# Patient Record
Sex: Female | Born: 1970 | Race: White | Hispanic: No | Marital: Married | Smoking: Never smoker
Health system: Southern US, Community
[De-identification: ages and names within clinical notes are randomized; demographics above are authoritative.]

## PROBLEM LIST (undated history)

## (undated) DIAGNOSIS — J209 Acute bronchitis, unspecified: Secondary | ICD-10-CM

## (undated) DIAGNOSIS — Z0000 Encounter for general adult medical examination without abnormal findings: Secondary | ICD-10-CM

## (undated) DIAGNOSIS — B269 Mumps without complication: Secondary | ICD-10-CM

## (undated) DIAGNOSIS — D696 Thrombocytopenia, unspecified: Secondary | ICD-10-CM

## (undated) DIAGNOSIS — D229 Melanocytic nevi, unspecified: Secondary | ICD-10-CM

## (undated) DIAGNOSIS — N390 Urinary tract infection, site not specified: Secondary | ICD-10-CM

## (undated) DIAGNOSIS — M542 Cervicalgia: Secondary | ICD-10-CM

## (undated) DIAGNOSIS — L989 Disorder of the skin and subcutaneous tissue, unspecified: Secondary | ICD-10-CM

## (undated) DIAGNOSIS — F411 Generalized anxiety disorder: Secondary | ICD-10-CM

## (undated) DIAGNOSIS — J45901 Unspecified asthma with (acute) exacerbation: Secondary | ICD-10-CM

## (undated) DIAGNOSIS — E785 Hyperlipidemia, unspecified: Secondary | ICD-10-CM

## (undated) DIAGNOSIS — N6311 Unspecified lump in the right breast, upper outer quadrant: Secondary | ICD-10-CM

## (undated) DIAGNOSIS — Z87440 Personal history of urinary (tract) infections: Secondary | ICD-10-CM

## (undated) DIAGNOSIS — T7840XA Allergy, unspecified, initial encounter: Secondary | ICD-10-CM

## (undated) DIAGNOSIS — J45909 Unspecified asthma, uncomplicated: Secondary | ICD-10-CM

## (undated) DIAGNOSIS — R739 Hyperglycemia, unspecified: Secondary | ICD-10-CM

## (undated) DIAGNOSIS — B019 Varicella without complication: Secondary | ICD-10-CM

## (undated) DIAGNOSIS — G43909 Migraine, unspecified, not intractable, without status migrainosus: Secondary | ICD-10-CM

## (undated) DIAGNOSIS — O99119 Other diseases of the blood and blood-forming organs and certain disorders involving the immune mechanism complicating pregnancy, unspecified trimester: Secondary | ICD-10-CM

## (undated) DIAGNOSIS — Z86718 Personal history of other venous thrombosis and embolism: Secondary | ICD-10-CM

## (undated) DIAGNOSIS — M79674 Pain in right toe(s): Secondary | ICD-10-CM

## (undated) HISTORY — DX: Unspecified asthma with (acute) exacerbation: J45.901

## (undated) HISTORY — DX: Melanocytic nevi, unspecified: D22.9

## (undated) HISTORY — DX: Encounter for general adult medical examination without abnormal findings: Z00.00

## (undated) HISTORY — DX: Hyperglycemia, unspecified: R73.9

## (undated) HISTORY — DX: Acute bronchitis, unspecified: J20.9

## (undated) HISTORY — DX: Unspecified lump in the right breast, upper outer quadrant: N63.11

## (undated) HISTORY — DX: Mumps without complication: B26.9

## (undated) HISTORY — DX: Unspecified asthma, uncomplicated: J45.909

## (undated) HISTORY — DX: Thrombocytopenia, unspecified: D69.6

## (undated) HISTORY — DX: Personal history of urinary (tract) infections: Z87.440

## (undated) HISTORY — DX: Allergy, unspecified, initial encounter: T78.40XA

## (undated) HISTORY — DX: Pain in right toe(s): M79.674

## (undated) HISTORY — DX: Varicella without complication: B01.9

## (undated) HISTORY — DX: Generalized anxiety disorder: F41.1

## (undated) HISTORY — DX: Hyperlipidemia, unspecified: E78.5

## (undated) HISTORY — DX: Disorder of the skin and subcutaneous tissue, unspecified: L98.9

## (undated) HISTORY — DX: Other diseases of the blood and blood-forming organs and certain disorders involving the immune mechanism complicating pregnancy, unspecified trimester: O99.119

## (undated) HISTORY — DX: Personal history of other venous thrombosis and embolism: Z86.718

## (undated) HISTORY — DX: Urinary tract infection, site not specified: N39.0

## (undated) HISTORY — DX: Cervicalgia: M54.2

## (undated) HISTORY — DX: Migraine, unspecified, not intractable, without status migrainosus: G43.909

## (undated) HISTORY — PX: TUBAL LIGATION: SHX77

---

## 1990-09-21 HISTORY — PX: HAMMER TOE SURGERY: SHX385

## 2003-09-22 LAB — HM PAP SMEAR: HM Pap smear: NORMAL

## 2012-02-29 ENCOUNTER — Encounter: Payer: Self-pay | Admitting: Family Medicine

## 2012-02-29 ENCOUNTER — Ambulatory Visit (INDEPENDENT_AMBULATORY_CARE_PROVIDER_SITE_OTHER): Payer: BC Managed Care – PPO | Admitting: Family Medicine

## 2012-02-29 VITALS — BP 102/66 | HR 54 | Temp 98.2°F | Ht 62.75 in | Wt 108.1 lb

## 2012-02-29 DIAGNOSIS — D229 Melanocytic nevi, unspecified: Secondary | ICD-10-CM

## 2012-02-29 DIAGNOSIS — Z0000 Encounter for general adult medical examination without abnormal findings: Secondary | ICD-10-CM

## 2012-02-29 DIAGNOSIS — D696 Thrombocytopenia, unspecified: Secondary | ICD-10-CM

## 2012-02-29 DIAGNOSIS — J45909 Unspecified asthma, uncomplicated: Secondary | ICD-10-CM | POA: Insufficient documentation

## 2012-02-29 DIAGNOSIS — R7309 Other abnormal glucose: Secondary | ICD-10-CM

## 2012-02-29 DIAGNOSIS — D239 Other benign neoplasm of skin, unspecified: Secondary | ICD-10-CM

## 2012-02-29 DIAGNOSIS — R739 Hyperglycemia, unspecified: Secondary | ICD-10-CM

## 2012-02-29 DIAGNOSIS — O99119 Other diseases of the blood and blood-forming organs and certain disorders involving the immune mechanism complicating pregnancy, unspecified trimester: Secondary | ICD-10-CM

## 2012-02-29 HISTORY — DX: Other diseases of the blood and blood-forming organs and certain disorders involving the immune mechanism complicating pregnancy, unspecified trimester: D69.6

## 2012-02-29 HISTORY — DX: Thrombocytopenia, unspecified: D69.6

## 2012-02-29 HISTORY — DX: Hyperglycemia, unspecified: R73.9

## 2012-02-29 LAB — LIPID PANEL
Cholesterol: 114 mg/dL (ref 0–200)
HDL: 44.5 mg/dL (ref >39.00)
LDL Cholesterol: 55 mg/dL (ref 0–99)
Triglycerides: 71 mg/dL (ref 0.0–149.0)
VLDL: 14.2 mg/dL (ref 0.0–40.0)

## 2012-02-29 LAB — HEPATIC FUNCTION PANEL
Albumin: 4.4 g/dL (ref 3.5–5.2)
Bilirubin, Direct: 0.1 mg/dL (ref 0.0–0.3)
Total Protein: 6.9 g/dL (ref 6.0–8.3)

## 2012-02-29 LAB — RENAL FUNCTION PANEL
CO2: 26 mEq/L (ref 19–32)
Chloride: 107 mEq/L (ref 96–112)
GFR: 108.6 mL/min (ref >60.00)
Phosphorus: 3 mg/dL (ref 2.3–4.6)
Potassium: 4.6 mEq/L (ref 3.5–5.1)

## 2012-02-29 LAB — CBC
Platelets: 115 10*3/uL — ABNORMAL LOW (ref 150.0–400.0)
RBC: 4.38 Mil/uL (ref 3.87–5.11)

## 2012-02-29 MED ORDER — ALBUTEROL SULFATE HFA 108 (90 BASE) MCG/ACT IN AERS: 2.0000 | INHALATION_SPRAY | Freq: Four times a day (QID) | RESPIRATORY_TRACT | Status: DC | PRN

## 2012-02-29 NOTE — Patient Instructions (Signed)
Preventive Care for Adults, Female A healthy lifestyle and preventive care can promote health and wellness. Preventive health guidelines for women include the following key practices.  A routine yearly physical is a good way to check with your caregiver about your health and preventive screening. It is a chance to share any concerns and updates on your health, and to receive a thorough exam.   Visit your dentist for a routine exam and preventive care every 6 months. Brush your teeth twice a day and floss once a day. Good oral hygiene prevents tooth decay and gum disease.   The frequency of eye exams is based on your age, health, family medical history, use of contact lenses, and other factors. Follow your caregiver's recommendations for frequency of eye exams.   Eat a healthy diet. Foods like vegetables, fruits, whole grains, low-fat dairy products, and lean protein foods contain the nutrients you need without too many calories. Decrease your intake of foods high in solid fats, added sugars, and salt. Eat the right amount of calories for you.Get information about a proper diet from your caregiver, if necessary.   Regular physical exercise is one of the most important things you can do for your health. Most adults should get at least 150 minutes of moderate-intensity exercise (any activity that increases your heart rate and causes you to sweat) each week. In addition, most adults need muscle-strengthening exercises on 2 or more days a week.   Maintain a healthy weight. The body mass index (BMI) is a screening tool to identify possible weight problems. It provides an estimate of body fat based on height and weight. Your caregiver can help determine your BMI, and can help you achieve or maintain a healthy weight.For adults 20 years and older:   A BMI below 18.5 is considered underweight.   A BMI of 18.5 to 24.9 is normal.   A BMI of 25 to 29.9 is considered overweight.   A BMI of 30 and above is  considered obese.   Maintain normal blood lipids and cholesterol levels by exercising and minimizing your intake of saturated fat. Eat a balanced diet with plenty of fruit and vegetables. Blood tests for lipids and cholesterol should begin at age 20 and be repeated every 5 years. If your lipid or cholesterol levels are high, you are over 50, or you are at high risk for heart disease, you may need your cholesterol levels checked more frequently.Ongoing high lipid and cholesterol levels should be treated with medicines if diet and exercise are not effective.   If you smoke, find out from your caregiver how to quit. If you do not use tobacco, do not start.   If you are pregnant, do not drink alcohol. If you are breastfeeding, be very cautious about drinking alcohol. If you are not pregnant and choose to drink alcohol, do not exceed 1 drink per day. One drink is considered to be 12 ounces (355 mL) of beer, 5 ounces (148 mL) of wine, or 1.5 ounces (44 mL) of liquor.   Avoid use of street drugs. Do not share needles with anyone. Ask for help if you need support or instructions about stopping the use of drugs.   High blood pressure causes heart disease and increases the risk of stroke. Your blood pressure should be checked at least every 1 to 2 years. Ongoing high blood pressure should be treated with medicines if weight loss and exercise are not effective.   If you are 55 to 41   years old, ask your caregiver if you should take aspirin to prevent strokes.   Diabetes screening involves taking a blood sample to check your fasting blood sugar level. This should be done once every 3 years, after age 45, if you are within normal weight and without risk factors for diabetes. Testing should be considered at a younger age or be carried out more frequently if you are overweight and have at least 1 risk factor for diabetes.   Breast cancer screening is essential preventive care for women. You should practice "breast  self-awareness." This means understanding the normal appearance and feel of your breasts and may include breast self-examination. Any changes detected, no matter how small, should be reported to a caregiver. Women in their 20s and 30s should have a clinical breast exam (CBE) by a caregiver as part of a regular health exam every 1 to 3 years. After age 40, women should have a CBE every year. Starting at age 40, women should consider having a mammography (breast X-ray test) every year. Women who have a family history of breast cancer should talk to their caregiver about genetic screening. Women at a high risk of breast cancer should talk to their caregivers about having magnetic resonance imaging (MRI) and a mammography every year.   The Pap test is a screening test for cervical cancer. A Pap test can show cell changes on the cervix that might become cervical cancer if left untreated. A Pap test is a procedure in which cells are obtained and examined from the lower end of the uterus (cervix).   Women should have a Pap test starting at age 21.   Between ages 21 and 29, Pap tests should be repeated every 2 years.   Beginning at age 30, you should have a Pap test every 3 years as long as the past 3 Pap tests have been normal.   Some women have medical problems that increase the chance of getting cervical cancer. Talk to your caregiver about these problems. It is especially important to talk to your caregiver if a new problem develops soon after your last Pap test. In these cases, your caregiver may recommend more frequent screening and Pap tests.   The above recommendations are the same for women who have or have not gotten the vaccine for human papillomavirus (HPV).   If you had a hysterectomy for a problem that was not cancer or a condition that could lead to cancer, then you no longer need Pap tests. Even if you no longer need a Pap test, a regular exam is a good idea to make sure no other problems are  starting.   If you are between ages 65 and 70, and you have had normal Pap tests going back 10 years, you no longer need Pap tests. Even if you no longer need a Pap test, a regular exam is a good idea to make sure no other problems are starting.   If you have had past treatment for cervical cancer or a condition that could lead to cancer, you need Pap tests and screening for cancer for at least 20 years after your treatment.   If Pap tests have been discontinued, risk factors (such as a new sexual partner) need to be reassessed to determine if screening should be resumed.   The HPV test is an additional test that may be used for cervical cancer screening. The HPV test looks for the virus that can cause the cell changes on the cervix.   The cells collected during the Pap test can be tested for HPV. The HPV test could be used to screen women aged 30 years and older, and should be used in women of any age who have unclear Pap test results. After the age of 30, women should have HPV testing at the same frequency as a Pap test.   Colorectal cancer can be detected and often prevented. Most routine colorectal cancer screening begins at the age of 50 and continues through age 75. However, your caregiver may recommend screening at an earlier age if you have risk factors for colon cancer. On a yearly basis, your caregiver may provide home test kits to check for hidden blood in the stool. Use of a small camera at the end of a tube, to directly examine the colon (sigmoidoscopy or colonoscopy), can detect the earliest forms of colorectal cancer. Talk to your caregiver about this at age 50, when routine screening begins. Direct examination of the colon should be repeated every 5 to 10 years through age 75, unless early forms of pre-cancerous polyps or small growths are found.   Hepatitis C blood testing is recommended for all people born from 1945 through 1965 and any individual with known risks for hepatitis C.    Practice safe sex. Use condoms and avoid high-risk sexual practices to reduce the spread of sexually transmitted infections (STIs). STIs include gonorrhea, chlamydia, syphilis, trichomonas, herpes, HPV, and human immunodeficiency virus (HIV). Herpes, HIV, and HPV are viral illnesses that have no cure. They can result in disability, cancer, and death. Sexually active women aged 25 and younger should be checked for chlamydia. Older women with new or multiple partners should also be tested for chlamydia. Testing for other STIs is recommended if you are sexually active and at increased risk.   Osteoporosis is a disease in which the bones lose minerals and strength with aging. This can result in serious bone fractures. The risk of osteoporosis can be identified using a bone density scan. Women ages 65 and over and women at risk for fractures or osteoporosis should discuss screening with their caregivers. Ask your caregiver whether you should take a calcium supplement or vitamin D to reduce the rate of osteoporosis.   Menopause can be associated with physical symptoms and risks. Hormone replacement therapy is available to decrease symptoms and risks. You should talk to your caregiver about whether hormone replacement therapy is right for you.   Use sunscreen with sun protection factor (SPF) of 30 or more. Apply sunscreen liberally and repeatedly throughout the day. You should seek shade when your shadow is shorter than you. Protect yourself by wearing long sleeves, pants, a wide-brimmed hat, and sunglasses year round, whenever you are outdoors.   Once a month, do a whole body skin exam, using a mirror to look at the skin on your back. Notify your caregiver of new moles, moles that have irregular borders, moles that are larger than a pencil eraser, or moles that have changed in shape or color.   Stay current with required immunizations.   Influenza. You need a dose every fall (or winter). The composition of  the flu vaccine changes each year, so being vaccinated once is not enough.   Pneumococcal polysaccharide. You need 1 to 2 doses if you smoke cigarettes or if you have certain chronic medical conditions. You need 1 dose at age 65 (or older) if you have never been vaccinated.   Tetanus, diphtheria, pertussis (Tdap, Td). Get 1 dose of   Tdap vaccine if you are younger than age 65, are over 65 and have contact with an infant, are a healthcare worker, are pregnant, or simply want to be protected from whooping cough. After that, you need a Td booster dose every 10 years. Consult your caregiver if you have not had at least 3 tetanus and diphtheria-containing shots sometime in your life or have a deep or dirty wound.   HPV. You need this vaccine if you are a woman age 26 or younger. The vaccine is given in 3 doses over 6 months.   Measles, mumps, rubella (MMR). You need at least 1 dose of MMR if you were born in 1957 or later. You may also need a second dose.   Meningococcal. If you are age 19 to 21 and a first-year college student living in a residence hall, or have one of several medical conditions, you need to get vaccinated against meningococcal disease. You may also need additional booster doses.   Zoster (shingles). If you are age 60 or older, you should get this vaccine.   Varicella (chickenpox). If you have never had chickenpox or you were vaccinated but received only 1 dose, talk to your caregiver to find out if you need this vaccine.   Hepatitis A. You need this vaccine if you have a specific risk factor for hepatitis A virus infection or you simply wish to be protected from this disease. The vaccine is usually given as 2 doses, 6 to 18 months apart.   Hepatitis B. You need this vaccine if you have a specific risk factor for hepatitis B virus infection or you simply wish to be protected from this disease. The vaccine is given in 3 doses, usually over 6 months.  Preventive Services /  Frequency Ages 19 to 39  Blood pressure check.** / Every 1 to 2 years.   Lipid and cholesterol check.** / Every 5 years beginning at age 20.   Clinical breast exam.** / Every 3 years for women in their 20s and 30s.   Pap test.** / Every 2 years from ages 21 through 29. Every 3 years starting at age 30 through age 65 or 70 with a history of 3 consecutive normal Pap tests.   HPV screening.** / Every 3 years from ages 30 through ages 65 to 70 with a history of 3 consecutive normal Pap tests.   Hepatitis C blood test.** / For any individual with known risks for hepatitis C.   Skin self-exam. / Monthly.   Influenza immunization.** / Every year.   Pneumococcal polysaccharide immunization.** / 1 to 2 doses if you smoke cigarettes or if you have certain chronic medical conditions.   Tetanus, diphtheria, pertussis (Tdap, Td) immunization. / A one-time dose of Tdap vaccine. After that, you need a Td booster dose every 10 years.   HPV immunization. / 3 doses over 6 months, if you are 26 and younger.   Measles, mumps, rubella (MMR) immunization. / You need at least 1 dose of MMR if you were born in 1957 or later. You may also need a second dose.   Meningococcal immunization. / 1 dose if you are age 19 to 21 and a first-year college student living in a residence hall, or have one of several medical conditions, you need to get vaccinated against meningococcal disease. You may also need additional booster doses.   Varicella immunization.** / Consult your caregiver.   Hepatitis A immunization.** / Consult your caregiver. 2 doses, 6 to 18 months   apart.   Hepatitis B immunization.** / Consult your caregiver. 3 doses usually over 6 months.  Ages 40 to 64  Blood pressure check.** / Every 1 to 2 years.   Lipid and cholesterol check.** / Every 5 years beginning at age 20.   Clinical breast exam.** / Every year after age 40.   Mammogram.** / Every year beginning at age 40 and continuing for as  long as you are in good health. Consult with your caregiver.   Pap test.** / Every 3 years starting at age 30 through age 65 or 70 with a history of 3 consecutive normal Pap tests.   HPV screening.** / Every 3 years from ages 30 through ages 65 to 70 with a history of 3 consecutive normal Pap tests.   Fecal occult blood test (FOBT) of stool. / Every year beginning at age 50 and continuing until age 75. You may not need to do this test if you get a colonoscopy every 10 years.   Flexible sigmoidoscopy or colonoscopy.** / Every 5 years for a flexible sigmoidoscopy or every 10 years for a colonoscopy beginning at age 50 and continuing until age 75.   Hepatitis C blood test.** / For all people born from 1945 through 1965 and any individual with known risks for hepatitis C.   Skin self-exam. / Monthly.   Influenza immunization.** / Every year.   Pneumococcal polysaccharide immunization.** / 1 to 2 doses if you smoke cigarettes or if you have certain chronic medical conditions.   Tetanus, diphtheria, pertussis (Tdap, Td) immunization.** / A one-time dose of Tdap vaccine. After that, you need a Td booster dose every 10 years.   Measles, mumps, rubella (MMR) immunization. / You need at least 1 dose of MMR if you were born in 1957 or later. You may also need a second dose.   Varicella immunization.** / Consult your caregiver.   Meningococcal immunization.** / Consult your caregiver.   Hepatitis A immunization.** / Consult your caregiver. 2 doses, 6 to 18 months apart.   Hepatitis B immunization.** / Consult your caregiver. 3 doses, usually over 6 months.  Ages 65 and over  Blood pressure check.** / Every 1 to 2 years.   Lipid and cholesterol check.** / Every 5 years beginning at age 20.   Clinical breast exam.** / Every year after age 40.   Mammogram.** / Every year beginning at age 40 and continuing for as long as you are in good health. Consult with your caregiver.   Pap test.** /  Every 3 years starting at age 30 through age 65 or 70 with a 3 consecutive normal Pap tests. Testing can be stopped between 65 and 70 with 3 consecutive normal Pap tests and no abnormal Pap or HPV tests in the past 10 years.   HPV screening.** / Every 3 years from ages 30 through ages 65 or 70 with a history of 3 consecutive normal Pap tests. Testing can be stopped between 65 and 70 with 3 consecutive normal Pap tests and no abnormal Pap or HPV tests in the past 10 years.   Fecal occult blood test (FOBT) of stool. / Every year beginning at age 50 and continuing until age 75. You may not need to do this test if you get a colonoscopy every 10 years.   Flexible sigmoidoscopy or colonoscopy.** / Every 5 years for a flexible sigmoidoscopy or every 10 years for a colonoscopy beginning at age 50 and continuing until age 75.   Hepatitis   C blood test.** / For all people born from 1945 through 1965 and any individual with known risks for hepatitis C.   Osteoporosis screening.** / A one-time screening for women ages 65 and over and women at risk for fractures or osteoporosis.   Skin self-exam. / Monthly.   Influenza immunization.** / Every year.   Pneumococcal polysaccharide immunization.** / 1 dose at age 65 (or older) if you have never been vaccinated.   Tetanus, diphtheria, pertussis (Tdap, Td) immunization. / A one-time dose of Tdap vaccine if you are over 65 and have contact with an infant, are a healthcare worker, or simply want to be protected from whooping cough. After that, you need a Td booster dose every 10 years.   Varicella immunization.** / Consult your caregiver.   Meningococcal immunization.** / Consult your caregiver.   Hepatitis A immunization.** / Consult your caregiver. 2 doses, 6 to 18 months apart.   Hepatitis B immunization.** / Check with your caregiver. 3 doses, usually over 6 months.  ** Family history and personal history of risk and conditions may change your caregiver's  recommendations. Document Released: 11/03/2001 Document Revised: 08/27/2011 Document Reviewed: 02/02/2011 [employer] Patient Information 2012 [employer]. 

## 2012-03-03 ENCOUNTER — Encounter: Payer: Self-pay | Admitting: Family Medicine

## 2012-03-03 DIAGNOSIS — Z0000 Encounter for general adult medical examination without abnormal findings: Secondary | ICD-10-CM | POA: Insufficient documentation

## 2012-03-03 DIAGNOSIS — D229 Melanocytic nevi, unspecified: Secondary | ICD-10-CM

## 2012-03-03 HISTORY — DX: Encounter for general adult medical examination without abnormal findings: Z00.00

## 2012-03-03 HISTORY — DX: Melanocytic nevi, unspecified: D22.9

## 2012-03-03 NOTE — Assessment & Plan Note (Signed)
Believes she had a Tdap roughly 7 years ago. Encouraged to start MGMs in 40s and have paps every 1-3 years. Needs heart healthy diet and encouraged to wear seat belt

## 2012-03-03 NOTE — Progress Notes (Signed)
[name], female   DOB: 01/26/1971, 41 y.o.   MRN: [id number] Stefana Lodico [id number] 03/01/1971 03/03/2012      Progress Note New Patient  Subjective  Chief Complaint  Chief Complaint  Patient presents with  . Establish Care    new patient    HPI  Patient is a 41 year old Caucasian female who is in today for new patient appointment. Overall she's in good health but has a couple of symptoms she notes. She says she has a long history of easy bruisability but feels she's been bruising slightly easier recently. She reports as a young woman being told she had low iron but is not clear that she had anemia per se. She did have a heavier menstrual cycles when she was younger 7 days in length and now has only 2 days of heavy bleeding followed by several days of light bleeding. She does follow a vegetarian and close to be in diet but tries to keep her iron intake high. As a spot on her left posterior shoulder at the pad of multiple off and is hypopigmented. Is now ready scaly because she got a sunburn recently. She had a halo mole removed from her right shoulder years ago.  Past Medical History  Diagnosis Date  . Chicken pox as a child  . Mumps as a child  . Asthma     cats, dust, triggers  . Hyperglycemia 02/29/2012  . Gestational thrombocytopenia 02/29/2012  . Preventative health care 03/03/2012    Past Surgical History  Procedure Date  . Cesarean section 1999, [id number]    X 3  . Hammer toe surgery 1992    right 5th toe  . Tubal ligation     Family History  Problem Relation Age of Onset  . Thyroid disease Father   . Depression Sister     related to cycle  . Muscular dystrophy Son     Rupert Stacks  . Other Son     Neurogenic bladder  . Asthma Son     mild  . Stroke Maternal Grandmother   . Heart attack Maternal Grandfather     ?  Marland Kitchen Heart disease Maternal Grandfather   . Diabetes Paternal Grandmother     type 2  . Hypertension Paternal Grandmother   .  Obesity Paternal Grandmother   . Other Sister     gluten intolerate or celiac, rectocele repair x 2 with bowel adhesions.  . Stroke Sister 36    s/p infections and surgeries  . Depression Sister   . Fibromyalgia Sister   . Thyroid disease Sister   . Cholelithiasis Sister     History   Social History  . Marital Status: Married    Spouse Name: N/A    Number of Children: N/A  . Years of Education: N/A   Occupational History  . Not on file.   Social History Main Topics  . Smoking status: Never Smoker   . Smokeless tobacco: Never Used  . Alcohol Use: No  . Drug Use: No  . Sexually Active: Yes -- Female partner(s)   Other Topics Concern  . Not on file   Social History Narrative  . No narrative on file    Current Outpatient Prescriptions on File Prior to Visit  Medication Sig Dispense Refill  . albuterol (PROVENTIL HFA;VENTOLIN HFA) 108 (90 BASE) MCG/ACT inhaler Inhale 2 puffs into the lungs every 6 (six) hours as needed for wheezing.  1 Inhaler  1    No  Known Allergies  Review of Systems  Review of Systems  Constitutional: Negative for fever, chills and malaise/fatigue.  HENT: Negative for hearing loss, nosebleeds and congestion.   Eyes: Negative for discharge.  Respiratory: Negative for cough, sputum production, shortness of breath and wheezing.   Cardiovascular: Negative for chest pain, palpitations and leg swelling.  Gastrointestinal: Negative for heartburn, nausea, vomiting, abdominal pain, diarrhea, constipation and blood in stool.  Genitourinary: Negative for dysuria, urgency, frequency and hematuria.  Musculoskeletal: Negative for myalgias, back pain and falls.  Skin: Negative for rash.       Mole has come off of left posterior shoulder and now she has a persistent hypopigmented lesion that is red and scaly today after a recent sunburn.  Neurological: Negative for dizziness, tremors, sensory change, focal weakness, loss of consciousness, weakness and headaches.    Endo/Heme/Allergies: Negative for polydipsia. Does not bruise/bleed easily.  Psychiatric/Behavioral: Negative for depression and suicidal ideas. The patient is not nervous/anxious and does not have insomnia.     Objective  BP 102/66  Pulse 54  Temp 98.2 F (36.8 C) (Temporal)  Ht 5' 2.75" (1.594 m)  Wt 108 lb 1.9 oz (49.043 kg)  BMI 19.31 kg/m2  SpO2 98%  LMP 02/15/2012  Physical Exam  Physical Exam  Constitutional: She is oriented to person, place, and time and well-developed, well-nourished, and in no distress. No distress.  HENT:  Head: Normocephalic and atraumatic.  Right Ear: External ear normal.  Left Ear: External ear normal.  Nose: Nose normal.  Mouth/Throat: Oropharynx is clear and moist. No oropharyngeal exudate.  Eyes: Conjunctivae are normal. Pupils are equal, round, and reactive to light. Right eye exhibits no discharge. Left eye exhibits no discharge. No scleral icterus.  Neck: Normal range of motion. Neck supple. No thyromegaly present.  Cardiovascular: Normal rate, regular rhythm, normal heart sounds and intact distal pulses.   No murmur heard. Pulmonary/Chest: Effort normal and breath sounds normal. No respiratory distress. She has no wheezes. She has no rales.  Abdominal: Soft. Bowel sounds are normal. She exhibits no distension and no mass. There is no tenderness.  Musculoskeletal: Normal range of motion. She exhibits no edema and no tenderness.  Lymphadenopathy:    She has no cervical adenopathy.  Neurological: She is alert and oriented to person, place, and time. She has normal reflexes. No cranial nerve deficit. Coordination normal.  Skin: Skin is warm and dry. Rash noted. She is not diaphoretic.       1 cm erythematous, scaly, circular lesion on left posterior shoulder.  Psychiatric: Mood, memory and affect normal.       Assessment & Plan  Preventative health care Believes she had a Tdap roughly 7 years ago. Encouraged to start MGMs in 40s and  have paps every 1-3 years. Needs heart healthy diet and encouraged to wear seat belt  Gestational thrombocytopenia Will check a CBC and further investigate as needed.  Asthma Infrequent flares, encouraged to keep an Albuterol inhaler with her. Given rx  Hyperglycemia Avoid simple carbs, eat small, frequent meals with lean proteins and complex carbs. Follow renal panel

## 2012-03-03 NOTE — Assessment & Plan Note (Signed)
Infrequent flares, encouraged to keep an Albuterol inhaler with her. Given rx

## 2012-03-03 NOTE — Assessment & Plan Note (Signed)
Avoid simple carbs, eat small, frequent meals with lean proteins and complex carbs. Follow renal panel

## 2012-03-03 NOTE — Assessment & Plan Note (Signed)
Will check a CBC and further investigate as needed.

## 2012-03-16 ENCOUNTER — Other Ambulatory Visit: Payer: Self-pay | Admitting: Family Medicine

## 2012-03-16 DIAGNOSIS — Z1231 Encounter for screening mammogram for malignant neoplasm of breast: Secondary | ICD-10-CM

## 2012-03-21 ENCOUNTER — Ambulatory Visit (INDEPENDENT_AMBULATORY_CARE_PROVIDER_SITE_OTHER): Payer: BC Managed Care – PPO | Admitting: Family Medicine

## 2012-03-21 ENCOUNTER — Other Ambulatory Visit (HOSPITAL_COMMUNITY)
Admission: RE | Admit: 2012-03-21 | Discharge: 2012-03-21 | Disposition: A | Discharge status: Home / Self Care | Payer: BC Managed Care – PPO | Source: Ambulatory Visit | Attending: Family Medicine | Admitting: Family Medicine

## 2012-03-21 ENCOUNTER — Encounter: Payer: Self-pay | Admitting: Family Medicine

## 2012-03-21 VITALS — BP 114/71 | HR 61 | Temp 98.0°F | Ht 62.75 in | Wt 111.8 lb

## 2012-03-21 DIAGNOSIS — D696 Thrombocytopenia, unspecified: Secondary | ICD-10-CM

## 2012-03-21 DIAGNOSIS — Z124 Encounter for screening for malignant neoplasm of cervix: Secondary | ICD-10-CM | POA: Insufficient documentation

## 2012-03-21 DIAGNOSIS — N63 Unspecified lump in unspecified breast: Secondary | ICD-10-CM

## 2012-03-21 DIAGNOSIS — N6311 Unspecified lump in the right breast, upper outer quadrant: Secondary | ICD-10-CM

## 2012-03-21 DIAGNOSIS — N760 Acute vaginitis: Secondary | ICD-10-CM

## 2012-03-21 HISTORY — DX: Unspecified lump in the right breast, upper outer quadrant: N63.11

## 2012-03-21 NOTE — Assessment & Plan Note (Signed)
Patient was already scheduled for her first screening MGM later this week but her husband noted this know last Weds. It is palpated on exam today at 10 oclock is firm, mobile roughly 1 cm in diameter, nontender. Diagnostic MGM ordered for further evaluation

## 2012-03-21 NOTE — Patient Instructions (Addendum)
Mammogram A mammogram is an X-ray test to find changes in a woman's breast. You should get a mammogram if:  You are over 41 years of age.   You have risk factors.   Your doctor recommends that you have one.  BEFORE THE TEST  Do not schedule the test the week before your period, especially if your breasts are sore during this time.  On the day of your mammogram:  Wash your breasts and armpits well. After washing, do not put on any deodorant or talcum powder on until after your test.   Eat and drink as you usually do.   Take your medicines as usual.   If you are diabetic and take insulin, make sure you:   Eat before coming for your test.   Take your insulin as usual.   If you cannot keep your appointment, call before the appointment to cancel. Schedule another appointment.  TEST  You will need to undress from the waist up. You will put on a hospital gown.   Your breast will be put on the mammogram machine, and it will press firmly on your breast with a piece of plastic called a compression paddle. This will make your breast flatter so that the machine can X-ray all parts of your breast.   Both breasts will be X-rayed. Each breast will be X-rayed from above and from the side. An X-ray might need to be taken again if the picture is not good enough.   The mammogram will last about 15 to 30 minutes.  AFTER THE TEST Finding out the results of your test Ask when your test results will be ready. Make sure you get your test results. Document Released: 12/04/2008 Document Revised: 08/27/2011 Document Reviewed: 12/04/2008 [employer] Patient Information 2012 [employer]. 

## 2012-03-21 NOTE — Assessment & Plan Note (Addendum)
Pap taken today, results pending

## 2012-03-21 NOTE — Progress Notes (Signed)
[name], female   DOB: 10-10-1970, 41 y.o.   MRN: [id number] April Bridges [id number] 06-18-1971 03/21/2012      Progress Note-Follow Up  Subjective  Chief Complaint  Chief Complaint  Patient presents with  . lump in breast    Right breast has lump- found last week- no pain    HPI  Patient is a 41 year old Caucasian female who is in today urgently for a breast lump. She was scheduled later this week for a baseline screening mammogram but unfortunately her husband palpated a lesion at 10:00 in her right breast in the last week and it is unchanged since that time. She says is nontender. She has had no skin changes or nipple discharge. No lesions are noted on the left. 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 Only other acute complaint is of fatigue. She reports this is unchanged and persistent. She was scheduled to have her GYN exam done here next week. At 8 years. Other than persistent discharge which is unchanged and otherwise asymptomatic she offers no complaints. No illness since she was last seen, chest pain, fevers, GI complaints noted.  Past Medical History  Diagnosis Date  . Chicken pox as a child  . Mumps as a child  . Asthma     cats, dust, triggers  . Hyperglycemia 02/29/2012  . Gestational thrombocytopenia 02/29/2012  . Preventative health care 03/03/2012  . Atypical moles 03/03/2012  . Thrombocytopenia 02/29/2012    Possible anemia/low iron? In past per patient   . Breast lump on right side at 10 o'clock position 03/21/2012    Past Surgical History  Procedure Date  . Cesarean section 1999, [id number]    X 3  . Hammer toe surgery 1992    right 5th toe  . Tubal ligation     Family History  Problem Relation Age of Onset  . Thyroid disease Father   . Depression Sister     related to cycle  . Muscular dystrophy Son     Rupert Stacks  . Other Son     Neurogenic bladder  . Asthma Son     mild  . Stroke Maternal Grandmother   . Heart  attack Maternal Grandfather     ?  Marland Kitchen Heart disease Maternal Grandfather   . Diabetes Paternal Grandmother     type 2  . Hypertension Paternal Grandmother   . Obesity Paternal Grandmother   . Other Sister     gluten intolerate or celiac, rectocele repair x 2 with bowel adhesions.  . Stroke Sister 17    s/p infections and surgeries  . Depression Sister   . Fibromyalgia Sister   . Thyroid disease Sister   . Cholelithiasis Sister     History   Social History  . Marital Status: Married    Spouse Name: N/A    Number of Children: N/A  . Years of Education: N/A   Occupational History  . Not on file.   Social History Main Topics  . Smoking status: Never Smoker   . Smokeless tobacco: Never Used  . Alcohol Use: No  . Drug Use: No  . Sexually Active: Yes -- Female partner(s)   Other Topics Concern  . Not on file   Social History Narrative  . No narrative on file    Current Outpatient Prescriptions on File Prior to Visit  Medication Sig Dispense Refill  . albuterol (PROVENTIL HFA;VENTOLIN HFA) 108 (90 BASE) MCG/ACT inhaler Inhale  2 puffs into the lungs every 6 (six) hours as needed for wheezing.  1 Inhaler  1    No Known Allergies  Review of Systems  Review of Systems  Constitutional: Negative for fever and malaise/fatigue.  HENT: Negative for congestion.   Eyes: Negative for discharge.  Respiratory: Negative for shortness of breath.   Cardiovascular: Negative for chest pain, palpitations and leg swelling.  Gastrointestinal: Negative for nausea, abdominal pain and diarrhea.  Genitourinary: Negative for dysuria.  Musculoskeletal: Negative for falls.  Skin: Negative for rash.  Neurological: Negative for loss of consciousness and headaches.  Endo/Heme/Allergies: Negative for polydipsia.  Psychiatric/Behavioral: Negative for depression and suicidal ideas. The patient is not nervous/anxious and does not have insomnia.     Objective  BP 114/71  Pulse 61  Temp 98 F  (36.7 C) (Temporal)  Ht 5' 2.75" (1.594 m)  Wt 111 lb 12.8 oz (50.712 kg)  BMI 19.96 kg/m2  SpO2 100%  LMP 03/09/2012  Physical Exam  Physical Exam  Constitutional: She is oriented to person, place, and time and well-developed, well-nourished, and in no distress. No distress.  HENT:  Head: Normocephalic and atraumatic.  Eyes: Conjunctivae are normal.  Neck: Neck supple. No thyromegaly present.  Cardiovascular: Normal rate, regular rhythm and normal heart sounds.   No murmur heard. Pulmonary/Chest: Effort normal and breath sounds normal. She has no wheezes.  Abdominal: Soft. Bowel sounds are normal. She exhibits no distension and no mass.  Genitourinary: Uterus normal, cervix normal, right adnexa normal and left adnexa normal. Vaginal discharge found.       Slight, clear vaginal discharge. Breast exam, left clear without lesions, dishcarge or skin changes. Right breast has a palpable lesion at 10 oclock, firm, mobile, NT, 1 cm in diameter, otherwise normal  Musculoskeletal: She exhibits no edema.  Lymphadenopathy:    She has no cervical adenopathy.  Neurological: She is alert and oriented to person, place, and time.  Skin: Skin is warm and dry. No rash noted. She is not diaphoretic.  Psychiatric: Memory, affect and judgment normal.    Lab Results  Component Value Date   TSH 1.17 02/29/2012   Lab Results  Component Value Date   WBC 4.3* 02/29/2012   HGB 13.9 02/29/2012   HCT 41.3 02/29/2012   MCV 94.2 02/29/2012   PLT 115.0* 02/29/2012   Lab Results  Component Value Date   CREATININE 0.6 02/29/2012   BUN 15 02/29/2012   NA 141 02/29/2012   K 4.6 02/29/2012   CL 107 02/29/2012   CO2 26 02/29/2012   Lab Results  Component Value Date   ALT 13 02/29/2012   AST 21 02/29/2012   ALKPHOS 52 02/29/2012   BILITOT 0.7 02/29/2012   Lab Results  Component Value Date   CHOL 114 02/29/2012   Lab Results  Component Value Date   HDL 44.50 02/29/2012   Lab Results  Component Value Date     LDLCALC 55 02/29/2012   Lab Results  Component Value Date   TRIG 71.0 02/29/2012   Lab Results  Component Value Date   CHOLHDL 3 02/29/2012     Assessment & Plan  Cervical cancer screening Pap taken today, results pending  Thrombocytopenia Repeat cbc today  Breast lump on right side at 10 o'clock position Patient was already scheduled for her first screening MGM later this week but her husband noted this know last Weds. It is palpated on exam today at 10 oclock is firm, mobile roughly 1 cm  in diameter, nontender. Diagnostic MGM ordered for further evaluation

## 2012-03-21 NOTE — Assessment & Plan Note (Addendum)
Repeat cbc today 

## 2012-03-22 LAB — CBC
MCHC: 33 g/dL (ref 30.0–36.0)
Platelets: 121 10*3/uL — ABNORMAL LOW (ref 150.0–400.0)
RDW: 12.8 % (ref 11.5–14.6)
WBC: 5.6 10*3/uL (ref 4.5–10.5)

## 2012-03-23 ENCOUNTER — Ambulatory Visit: Payer: BC Managed Care – PPO

## 2012-03-28 ENCOUNTER — Ambulatory Visit
Admission: RE | Admit: 2012-03-28 | Discharge: 2012-03-28 | Disposition: A | Discharge status: Home / Self Care | Payer: BC Managed Care – PPO | Source: Ambulatory Visit | Attending: Family Medicine | Admitting: Family Medicine

## 2012-03-28 ENCOUNTER — Other Ambulatory Visit: Payer: Self-pay | Admitting: Family Medicine

## 2012-03-28 DIAGNOSIS — N6311 Unspecified lump in the right breast, upper outer quadrant: Secondary | ICD-10-CM

## 2012-03-29 ENCOUNTER — Ambulatory Visit: Payer: BC Managed Care – PPO | Admitting: Family Medicine

## 2012-03-30 ENCOUNTER — Ambulatory Visit: Payer: BC Managed Care – PPO | Admitting: Family Medicine

## 2012-04-13 ENCOUNTER — Ambulatory Visit (INDEPENDENT_AMBULATORY_CARE_PROVIDER_SITE_OTHER): Payer: BC Managed Care – PPO | Admitting: Family Medicine

## 2012-04-13 ENCOUNTER — Encounter: Payer: Self-pay | Admitting: Family Medicine

## 2012-04-13 VITALS — BP 121/73 | HR 56 | Temp 97.6°F | Ht 62.75 in | Wt 108.8 lb

## 2012-04-13 DIAGNOSIS — R319 Hematuria, unspecified: Secondary | ICD-10-CM

## 2012-04-13 DIAGNOSIS — N390 Urinary tract infection, site not specified: Secondary | ICD-10-CM

## 2012-04-13 LAB — POCT URINALYSIS DIPSTICK
Bilirubin, UA: NEGATIVE
Glucose, UA: NEGATIVE
Nitrite, UA: NEGATIVE
Urobilinogen, UA: 0.2

## 2012-04-13 MED ORDER — NITROFURANTOIN MONOHYD MACRO 100 MG PO CAPS: 100.0000 mg | ORAL_CAPSULE | Freq: Two times a day (BID) | ORAL | Status: AC

## 2012-04-13 MED ORDER — PHENAZOPYRIDINE HCL 100 MG PO TABS: 100.0000 mg | ORAL_TABLET | Freq: Three times a day (TID) | ORAL | Status: AC | PRN

## 2012-04-13 NOTE — Patient Instructions (Addendum)
Urinary Tract Infection Infections of the urinary tract can start in several places. A bladder infection (cystitis), a kidney infection (pyelonephritis), and a prostate infection (prostatitis) are different types of urinary tract infections (UTIs). They usually get better if treated with medicines (antibiotics) that kill germs. Take all the medicine until it is gone. You or your child may feel better in a few days, but TAKE ALL MEDICINE or the infection may not respond and may become more difficult to treat. HOME CARE INSTRUCTIONS   Drink enough water and fluids to keep the urine clear or pale yellow. Cranberry juice is especially recommended, in addition to large amounts of water.   Avoid caffeine, tea, and carbonated beverages. They tend to irritate the bladder.   Alcohol may irritate the prostate.   Only take over-the-counter or prescription medicines for pain, discomfort, or fever as directed by your caregiver.  To prevent further infections:  Empty the bladder often. Avoid holding urine for long periods of time.   After a bowel movement, women should cleanse from front to back. Use each tissue only once.   Empty the bladder before and after sexual intercourse.  FINDING OUT THE RESULTS OF YOUR TEST Not all test results are available during your visit. If your or your child's test results are not back during the visit, make an appointment with your caregiver to find out the results. Do not assume everything is normal if you have not heard from your caregiver or the medical facility. It is important for you to follow up on all test results. SEEK MEDICAL CARE IF:   There is back pain.   Your baby is older than 3 months with a rectal temperature of 100.5 F (38.1 C) or higher for more than 1 day.   Your or your child's problems (symptoms) are no better in 3 days. Return sooner if you or your child is getting worse.  SEEK IMMEDIATE MEDICAL CARE IF:   There is severe back pain or lower  abdominal pain.   You or your child develops chills.   You have a fever.   Your baby is older than 3 months with a rectal temperature of 102 F (38.9 C) or higher.   Your baby is 78 months old or younger with a rectal temperature of 100.4 F (38 C) or higher.   There is nausea or vomiting.   There is continued burning or discomfort with urination.  MAKE SURE YOU:   Understand these instructions.   Will watch your condition.   Will get help right away if you are not doing well or get worse.  Document Released: 06/17/2005 Document Revised: 08/27/2011 Document Reviewed: 01/20/2007 [facility] Patient Information 2012 [place], [place].  Probiotic and yogurt daily, increase hydration etc

## 2012-04-14 ENCOUNTER — Encounter: Payer: Self-pay | Admitting: Family Medicine

## 2012-04-14 DIAGNOSIS — N390 Urinary tract infection, site not specified: Secondary | ICD-10-CM

## 2012-04-14 HISTORY — DX: Urinary tract infection, site not specified: N39.0

## 2012-04-14 NOTE — Assessment & Plan Note (Signed)
started on antibiotics, probiotics, pyridium. Increase fluids

## 2012-04-14 NOTE — Progress Notes (Signed)
[name], female   DOB: [birth year], 41 y.o.   MRN: [id number] April Bridges [id number] 02-16-1971 04/14/2012      Progress Note-Follow Up  Subjective  Chief Complaint  Chief Complaint  Patient presents with  . Urinary Tract Infection    X 2 days- pressure, burning, frequency    HPI  Patient is a 41 year old Caucasian female who is in today with a two-day history of urinary frequency, urgency, dysuria. She notes some suprapubic pressure as well. Denies any discharge. Denies any fevers or chills. No back pain, malaise, myalgias, nausea or GI complaints. He has tried cranberry tabs and initially thought they were helping but now her symptoms are worsening once again.  Past Medical History  Diagnosis Date  . Chicken pox as a child  . Mumps as a child  . Asthma     cats, dust, triggers  . Hyperglycemia 02/29/2012  . Gestational thrombocytopenia 02/29/2012  . Preventative health care 03/03/2012  . Atypical moles 03/03/2012  . Thrombocytopenia 02/29/2012    Possible anemia/low iron? In past per patient   . Breast lump on right side at 10 o'clock position 03/21/2012  . UTI (lower urinary tract infection) 04/14/2012    Past Surgical History  Procedure Date  . Cesarean section 1999, [id number]    X 3  . Hammer toe surgery 1992    right 5th toe  . Tubal ligation     Family History  Problem Relation Age of Onset  . Thyroid disease Father   . Depression Sister     related to cycle  . Muscular dystrophy Son     Rupert Stacks  . Other Son     Neurogenic bladder  . Asthma Son     mild  . Stroke Maternal Grandmother   . Heart attack Maternal Grandfather     ?  Marland Kitchen Heart disease Maternal Grandfather   . Diabetes Paternal Grandmother     type 2  . Hypertension Paternal Grandmother   . Obesity Paternal Grandmother   . Other Sister     gluten intolerate or celiac, rectocele repair x 2 with bowel adhesions.  . Stroke Sister 1    s/p infections and surgeries  .  Depression Sister   . Fibromyalgia Sister   . Thyroid disease Sister   . Cholelithiasis Sister     History   Social History  . Marital Status: Married    Spouse Name: N/A    Number of Children: N/A  . Years of Education: N/A   Occupational History  . Not on file.   Social History Main Topics  . Smoking status: Never Smoker   . Smokeless tobacco: Never Used  . Alcohol Use: No  . Drug Use: No  . Sexually Active: Yes -- Female partner(s)   Other Topics Concern  . Not on file   Social History Narrative  . No narrative on file    Current Outpatient Prescriptions on File Prior to Visit  Medication Sig Dispense Refill  . albuterol (PROVENTIL HFA;VENTOLIN HFA) 108 (90 BASE) MCG/ACT inhaler Inhale 2 puffs into the lungs every 6 (six) hours as needed for wheezing.  1 Inhaler  1    No Known Allergies  Review of Systems  Review of Systems  Constitutional: Negative for fever and malaise/fatigue.  HENT: Negative for congestion.   Eyes: Negative for discharge.  Respiratory: Negative for shortness of breath.   Cardiovascular: Negative for chest pain, palpitations and leg swelling.  Gastrointestinal: Positive for  abdominal pain. Negative for nausea and diarrhea.       Suprapubic pressure  Genitourinary: Positive for dysuria, urgency and frequency.  Musculoskeletal: Negative for falls.  Skin: Negative for rash.  Neurological: Negative for loss of consciousness and headaches.  Endo/Heme/Allergies: Negative for polydipsia.  Psychiatric/Behavioral: Negative for depression and suicidal ideas. The patient is not nervous/anxious and does not have insomnia.     Objective  BP 121/73  Pulse 56  Temp 97.6 F (36.4 C) (Temporal)  Ht 5' 2.75" (1.594 m)  Wt 108 lb 12.8 oz (49.351 kg)  BMI 19.43 kg/m2  SpO2 100%  LMP 04/08/2012  Physical Exam  Physical Exam  Constitutional: She is oriented to person, place, and time and well-developed, well-nourished, and in no distress. No  distress.  HENT:  Head: Normocephalic and atraumatic.  Eyes: Conjunctivae are normal.  Neck: Neck supple. No thyromegaly present.  Cardiovascular: Normal rate, regular rhythm and normal heart sounds.   No murmur heard. Pulmonary/Chest: Effort normal and breath sounds normal. She has no wheezes.  Abdominal: She exhibits no distension and no mass.  Musculoskeletal: She exhibits no edema.  Lymphadenopathy:    She has no cervical adenopathy.  Neurological: She is alert and oriented to person, place, and time.  Skin: Skin is warm and dry. No rash noted. She is not diaphoretic.  Psychiatric: Memory, affect and judgment normal.    Lab Results  Component Value Date   TSH 1.17 02/29/2012   Lab Results  Component Value Date   WBC 5.6 03/21/2012   HGB 13.7 03/21/2012   HCT 41.6 03/21/2012   MCV 95.5 03/21/2012   PLT 121.0* 03/21/2012   Lab Results  Component Value Date   CREATININE 0.6 02/29/2012   BUN 15 02/29/2012   NA 141 02/29/2012   K 4.6 02/29/2012   CL 107 02/29/2012   CO2 26 02/29/2012   Lab Results  Component Value Date   ALT 13 02/29/2012   AST 21 02/29/2012   ALKPHOS 52 02/29/2012   BILITOT 0.7 02/29/2012   Lab Results  Component Value Date   CHOL 114 02/29/2012   Lab Results  Component Value Date   HDL 44.50 02/29/2012   Lab Results  Component Value Date   LDLCALC 55 02/29/2012   Lab Results  Component Value Date   TRIG 71.0 02/29/2012   Lab Results  Component Value Date   CHOLHDL 3 02/29/2012     Assessment & Plan  UTI (lower urinary tract infection) started on antibiotics, probiotics, pyridium. Increase fluids

## 2012-04-16 LAB — URINE CULTURE: Colony Count: >=100000

## 2012-06-16 ENCOUNTER — Ambulatory Visit (INDEPENDENT_AMBULATORY_CARE_PROVIDER_SITE_OTHER): Payer: BC Managed Care – PPO

## 2012-06-16 ENCOUNTER — Other Ambulatory Visit (INDEPENDENT_AMBULATORY_CARE_PROVIDER_SITE_OTHER): Payer: BC Managed Care – PPO

## 2012-06-16 DIAGNOSIS — Z23 Encounter for immunization: Secondary | ICD-10-CM

## 2012-06-16 DIAGNOSIS — D696 Thrombocytopenia, unspecified: Secondary | ICD-10-CM

## 2012-06-16 LAB — CBC
Hemoglobin: 13.9 g/dL (ref 12.0–15.0)
MCHC: 32.5 g/dL (ref 30.0–36.0)
RDW: 12.3 % (ref 11.5–14.6)

## 2012-06-16 MED ORDER — TETANUS-DIPHTH-ACELL PERTUSSIS 5-2.5-18.5 LF-MCG/0.5 IM SUSP: 0.5000 mL | Freq: Once | INTRAMUSCULAR | Status: DC

## 2012-06-17 NOTE — Progress Notes (Signed)
Quick Note:  Patient Informed and voiced understanding.  Lab appt scheduled ______

## 2012-06-21 ENCOUNTER — Ambulatory Visit: Payer: BC Managed Care – PPO | Admitting: Family Medicine

## 2012-07-14 ENCOUNTER — Other Ambulatory Visit (INDEPENDENT_AMBULATORY_CARE_PROVIDER_SITE_OTHER): Payer: BC Managed Care – PPO

## 2012-07-14 DIAGNOSIS — D696 Thrombocytopenia, unspecified: Secondary | ICD-10-CM

## 2012-07-14 LAB — CBC
MCHC: 33.3 g/dL (ref 30.0–36.0)
Platelets: 86 10*3/uL — ABNORMAL LOW (ref 150.0–400.0)
RBC: 4.33 Mil/uL (ref 3.87–5.11)

## 2012-07-15 ENCOUNTER — Other Ambulatory Visit: Payer: BC Managed Care – PPO

## 2012-07-15 NOTE — Progress Notes (Signed)
Quick Note:  Patient Informed and voiced understanding.  Patient would like to proceed with referral ______

## 2012-07-20 ENCOUNTER — Other Ambulatory Visit: Payer: Self-pay | Admitting: Family Medicine

## 2012-07-20 ENCOUNTER — Telehealth: Payer: Self-pay | Admitting: Family Medicine

## 2012-07-20 DIAGNOSIS — D696 Thrombocytopenia, unspecified: Secondary | ICD-10-CM

## 2012-07-20 NOTE — Telephone Encounter (Signed)
Patient informed and voiced understanding

## 2012-07-20 NOTE — Telephone Encounter (Signed)
Referral is done, just double check with her and make sure she is not taking any NSAIDs (ie aspirin, Ibuprofen, Naproxen) OTC because that can drop her platelets, if she is she should stop them until seen by hematology

## 2012-07-20 NOTE — Telephone Encounter (Signed)
Patient would like a hematology referral due to recent blood tests results. Patient would like to go to [facility].

## 2012-08-16 ENCOUNTER — Telehealth: Payer: Self-pay | Admitting: Family Medicine

## 2012-08-16 NOTE — Telephone Encounter (Signed)
Patient requests to have her bloodwork done in our office for Dr Jolyne Loa [facility] [phone] fax. Dr Jolyne Loa is agreeable per patient. Patient said it isn't due until Feb. 2014. She states it is for her platlet count.

## 2012-08-26 NOTE — Telephone Encounter (Signed)
Thanks

## 2012-08-26 NOTE — Telephone Encounter (Signed)
FYI

## 2012-08-26 NOTE — Telephone Encounter (Signed)
Ok I future order plt count.

## 2012-08-29 ENCOUNTER — Other Ambulatory Visit: Payer: Self-pay | Admitting: Emergency Medicine

## 2012-08-29 ENCOUNTER — Ambulatory Visit: Payer: BC Managed Care – PPO | Admitting: Family Medicine

## 2012-08-29 DIAGNOSIS — D696 Thrombocytopenia, unspecified: Secondary | ICD-10-CM

## 2012-08-30 ENCOUNTER — Ambulatory Visit (INDEPENDENT_AMBULATORY_CARE_PROVIDER_SITE_OTHER): Payer: BC Managed Care – PPO | Admitting: Family Medicine

## 2012-08-30 ENCOUNTER — Encounter: Payer: Self-pay | Admitting: Family Medicine

## 2012-08-30 VITALS — BP 113/73 | HR 80 | Temp 98.4°F | Ht 62.75 in | Wt 111.1 lb

## 2012-08-30 DIAGNOSIS — T7840XA Allergy, unspecified, initial encounter: Secondary | ICD-10-CM | POA: Insufficient documentation

## 2012-08-30 DIAGNOSIS — J45901 Unspecified asthma with (acute) exacerbation: Secondary | ICD-10-CM

## 2012-08-30 HISTORY — DX: Allergy, unspecified, initial encounter: T78.40XA

## 2012-08-30 MED ORDER — AZITHROMYCIN 250 MG PO TABS: ORAL_TABLET | ORAL | Status: DC

## 2012-08-30 MED ORDER — METHYLPREDNISOLONE 4 MG PO KIT: PACK | ORAL | Status: DC

## 2012-08-30 MED ORDER — LORATADINE 10 MG PO TABS: 10.0000 mg | ORAL_TABLET | Freq: Every day | ORAL | Status: DC | PRN

## 2012-08-30 MED ORDER — HYDROCOD POLST-CHLORPHEN POLST 10-8 MG/5ML PO LQCR: 5.0000 mL | Freq: Every evening | ORAL | Status: DC | PRN

## 2012-08-30 NOTE — Assessment & Plan Note (Signed)
Loratadine 10 mg daily prn allergies for the the next month and then as needed after that

## 2012-08-30 NOTE — Patient Instructions (Addendum)

## 2012-08-30 NOTE — Assessment & Plan Note (Signed)
Symptoms persistent and worsening. Will start a Medrol doe pak and a Zpak.

## 2012-08-30 NOTE — Progress Notes (Signed)
[name], female   DOB: 07/01/1971, 41 y.o.   MRN: [id number] April Bridges [id number] Jun 13, 1971 08/30/2012      Progress Note-Follow Up  Subjective  Chief Complaint  Chief Complaint  Patient presents with  . Cough    X 1week & trouble breathing    HPI  Patient is a 41 year old caucasian female in today with a week worth of respiratory symptoms. Symptoms are worsening. She is struggling with low grade fevers, malaise, myalgias, mild nasal congestion. Mild irritation in throat. Cough is usually dry and occasionally wet is keeping her up at night. She is using her Albuterol at least daily and usually only uses it once or twice a year. Some PND also noted.  Past Medical History  Diagnosis Date  . Chicken pox as a child  . Mumps as a child  . Asthma     cats, dust, triggers  . Hyperglycemia 02/29/2012  . Gestational thrombocytopenia 02/29/2012  . Preventative health care 03/03/2012  . Atypical moles 03/03/2012  . Thrombocytopenia 02/29/2012    Possible anemia/low iron? In past per patient   . Breast lump on right side at 10 o'clock position 03/21/2012  . UTI (lower urinary tract infection) 04/14/2012  . Asthma exacerbation   . Allergic state 08/30/2012    Past Surgical History  Procedure Date  . Cesarean section 1999, [id number]    X 3  . Hammer toe surgery 1992    right 5th toe  . Tubal ligation     Family History  Problem Relation Age of Onset  . Thyroid disease Father   . Depression Sister     related to cycle  . Muscular dystrophy Son     Rupert Stacks  . Other Son     Neurogenic bladder  . Asthma Son     mild  . Stroke Maternal Grandmother   . Heart attack Maternal Grandfather     ?  Marland Kitchen Heart disease Maternal Grandfather   . Diabetes Paternal Grandmother     type 2  . Hypertension Paternal Grandmother   . Obesity Paternal Grandmother   . Other Sister     gluten intolerate or celiac, rectocele repair x 2 with bowel adhesions.  . Stroke Sister  30    s/p infections and surgeries  . Depression Sister   . Fibromyalgia Sister   . Thyroid disease Sister   . Cholelithiasis Sister     History   Social History  . Marital Status: Married    Spouse Name: N/A    Number of Children: N/A  . Years of Education: N/A   Occupational History  . Not on file.   Social History Main Topics  . Smoking status: Never Smoker   . Smokeless tobacco: Never Used  . Alcohol Use: No  . Drug Use: No  . Sexually Active: Yes -- Female partner(s)   Other Topics Concern  . Not on file   Social History Narrative  . No narrative on file    Current Outpatient Prescriptions on File Prior to Visit  Medication Sig Dispense Refill  . albuterol (PROVENTIL HFA;VENTOLIN HFA) 108 (90 BASE) MCG/ACT inhaler Inhale 2 puffs into the lungs every 6 (six) hours as needed for wheezing.  1 Inhaler  1  . loratadine (CLARITIN) 10 MG tablet Take 1 tablet (10 mg total) by mouth daily as needed for allergies.        No Known Allergies  Review of Systems  Review of Systems  Constitutional: Positive for fever, chills and malaise/fatigue.  HENT: Positive for congestion and sore throat.   Eyes: Negative for discharge.  Respiratory: Positive for cough, shortness of breath and wheezing.   Cardiovascular: Negative for chest pain, palpitations and leg swelling.  Gastrointestinal: Negative for heartburn, nausea, abdominal pain and diarrhea.  Genitourinary: Negative for dysuria.  Musculoskeletal: Negative for falls.  Skin: Negative for rash.  Neurological: Positive for headaches. Negative for loss of consciousness.  Endo/Heme/Allergies: Negative for polydipsia.  Psychiatric/Behavioral: Negative for depression and suicidal ideas. The patient is not nervous/anxious and does not have insomnia.     Objective  BP 113/73  Pulse 80  Temp 98.4 F (36.9 C) (Temporal)  Ht 5' 2.75" (1.594 m)  Wt 111 lb 1.9 oz (50.404 kg)  BMI 19.84 kg/m2  SpO2 100%  LMP  08/22/2012  Physical Exam  Physical Exam  Constitutional: She is oriented to person, place, and time and well-developed, well-nourished, and in no distress. No distress.  HENT:  Head: Normocephalic and atraumatic.  Eyes: Conjunctivae normal are normal.  Neck: Neck supple. No thyromegaly present.  Cardiovascular: Normal rate, regular rhythm and normal heart sounds.   No murmur heard. Pulmonary/Chest: Effort normal and breath sounds normal. She has no wheezes.  Abdominal: She exhibits no distension and no mass.  Musculoskeletal: She exhibits no edema.  Lymphadenopathy:    She has no cervical adenopathy.  Neurological: She is alert and oriented to person, place, and time.  Skin: Skin is warm and dry. No rash noted. She is not diaphoretic.  Psychiatric: Memory, affect and judgment normal.    Lab Results  Component Value Date   TSH 1.17 02/29/2012   Lab Results  Component Value Date   WBC 5.2 07/14/2012   HGB 13.6 07/14/2012   HCT 40.9 07/14/2012   MCV 94.4 07/14/2012   PLT 86.0* 07/14/2012   Lab Results  Component Value Date   CREATININE 0.6 02/29/2012   BUN 15 02/29/2012   NA 141 02/29/2012   K 4.6 02/29/2012   CL 107 02/29/2012   CO2 26 02/29/2012   Lab Results  Component Value Date   ALT 13 02/29/2012   AST 21 02/29/2012   ALKPHOS 52 02/29/2012   BILITOT 0.7 02/29/2012   Lab Results  Component Value Date   CHOL 114 02/29/2012   Lab Results  Component Value Date   HDL 44.50 02/29/2012   Lab Results  Component Value Date   LDLCALC 55 02/29/2012   Lab Results  Component Value Date   TRIG 71.0 02/29/2012   Lab Results  Component Value Date   CHOLHDL 3 02/29/2012     Assessment & Plan  Asthma exacerbation Symptoms persistent and worsening. Will start a Medrol doe pak and a Zpak.   Allergic state Loratadine 10 mg daily prn allergies for the the next month and then as needed after that

## 2012-09-26 ENCOUNTER — Ambulatory Visit (INDEPENDENT_AMBULATORY_CARE_PROVIDER_SITE_OTHER): Payer: BC Managed Care – PPO | Admitting: Family Medicine

## 2012-09-26 ENCOUNTER — Encounter: Payer: Self-pay | Admitting: Family Medicine

## 2012-09-26 VITALS — BP 104/70 | HR 75 | Temp 99.2°F | Ht 62.75 in | Wt 111.1 lb

## 2012-09-26 DIAGNOSIS — J45909 Unspecified asthma, uncomplicated: Secondary | ICD-10-CM

## 2012-09-26 DIAGNOSIS — D696 Thrombocytopenia, unspecified: Secondary | ICD-10-CM

## 2012-09-26 DIAGNOSIS — T7840XA Allergy, unspecified, initial encounter: Secondary | ICD-10-CM

## 2012-09-26 NOTE — Patient Instructions (Signed)
Thrombocytopenia  Thrombocytopenia is a condition in which there is an abnormally small number of platelets in your blood. Platelets are also called thrombocytes. Platelets are needed for blood clotting.  CAUSES  Thrombocytopenia is caused by:    Decreased production of platelets. This can be caused by:   Aplastic anemia in which your bone marrow quits making blood cells.   Cancer in the bone marrow.   Use of certain medicines, including chemotherapy.   Infection in the bone marrow.   Heavy alcohol consumption.   Increased destruction of platelets. This can be caused by:   Certain immune diseases.   Use of certain drugs.   Certain blood clotting disorders.   Certain inherited disorders.   Certain bleeding disorders.   Pregnancy.   Having an enlarged spleen (hypersplenism). In hypersplenism, the spleen gathers up platelets from circulation. This means the platelets are not available to help with blood clotting. The spleen can enlarge due to cirrhosis or other conditions.  SYMPTOMS   The symptoms of thrombocytopenia are side effects of poor blood clotting. Some of these are:   Abnormal bleeding.   Nosebleeds.   Heavy menstrual periods.   Blood in the urine or stools.   Purpura. This is a purplish discoloration in the skin produced by small bleeding vessels near the surface of the skin.   Bruising.   A rash that may be petechial. This looks like pinpoint, purplish-red spots on the skin and mucous membranes. It is caused by bleeding from small blood vessels (capillaries).  DIAGNOSIS   Your caregiver will make this diagnosis based on your exam and blood tests. Sometimes, a bone marrow study is done to look for the original cells (megakaryocytes) that make platelets.  TREATMENT   Treatment depends on the cause of the condition.   Medicines may be given to help protect your platelets from being destroyed.   In some cases, a replacement (transfusion) of platelets may be required to stop or prevent  bleeding.   Sometimes, the spleen must be surgically removed.  HOME CARE INSTRUCTIONS    Check the skin and linings inside your mouth for bruising or bleeding as directed by your caregiver.   Check your sputum, urine, and stool for blood as directed by your caregiver.   Do not return to any activities that could cause bumps or bruises until your caregiver says it is okay.   Take extra care not to cut yourself when shaving or when using scissors, needles, knives, and other tools.   Take extra care not to burn yourself when ironing or cooking.   Ask your caregiver if it is okay for you to drink alcohol.   Only take over-the-counter or prescription medicines as directed by your caregiver.   Notify all your caregivers, including dentists and eye doctors, about your condition.  SEEK IMMEDIATE MEDICAL CARE IF:    You develop active bleeding from anywhere in your body.   You develop unexplained bruising or bleeding.   You have blood in your sputum, urine, or stool.  MAKE SURE YOU:   Understand these instructions.   Will watch your condition.   Will get help right away if you are not doing well or get worse.  Document Released: 09/07/2005 Document Revised: 11/30/2011 Document Reviewed: 07/10/2011  [employer] Patient Information 2013 [employer]

## 2012-09-26 NOTE — Assessment & Plan Note (Signed)
Following with [facility] now. Work up unremarkable per patient will request records and patient has asked [place] to repeat her platelet count for her in February that is ordered today

## 2012-09-27 ENCOUNTER — Encounter: Payer: Self-pay | Admitting: Family Medicine

## 2012-09-27 NOTE — Progress Notes (Signed)
[name], female   DOB: 11-02-1970, 42 y.o.   MRN: [id number] April Bridges [id number] May 31, 1971 09/27/2012      Progress Note-Follow Up  Subjective  Chief Complaint  Chief Complaint  Patient presents with  . Follow-up    6 month    HPI  Patient is a 42 year old Caucasian female who is in today for followup. Her recent asthma exacerbations, down. She reports a good response to steroid pack which gave her a good bit of energy and the azithromycin. At present no coughing is noted in her albuterol is no longer needed. No fevers or chills. No headaches, chest pain, palpitations, shortness of breath, GI or GU complaints noted today. She has been seen by hematology at [facility]. 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 She was pleased with her care and will continue in this regard.  Past Medical History  Diagnosis Date  . Chicken pox as a child  . Mumps as a child  . Asthma     cats, dust, triggers  . Hyperglycemia 02/29/2012  . Gestational thrombocytopenia 02/29/2012  . Preventative health care 03/03/2012  . Atypical moles 03/03/2012  . Thrombocytopenia 02/29/2012    Possible anemia/low iron? In past per patient   . Breast lump on right side at 10 o'clock position 03/21/2012  . UTI (lower urinary tract infection) 04/14/2012  . Asthma exacerbation   . Allergic state 08/30/2012  . Asthma     Past Surgical History  Procedure Date  . Cesarean section 1999, [id number]    X 3  . Hammer toe surgery 1992    right 5th toe  . Tubal ligation     Family History  Problem Relation Age of Onset  . Thyroid disease Father   . Depression Sister     related to cycle  . Muscular dystrophy Son     Rupert Stacks  . Other Son     Neurogenic bladder  . Asthma Son     mild  . Stroke Maternal  Grandmother   . Heart attack Maternal Grandfather     ?  Marland Kitchen Heart disease Maternal Grandfather   . Diabetes Paternal Grandmother     type 2  . Hypertension Paternal Grandmother   . Obesity Paternal Grandmother   . Other Sister     gluten intolerate or celiac, rectocele repair x 2 with bowel adhesions.  . Stroke Sister 54    s/p infections and surgeries  . Depression Sister   . Fibromyalgia Sister   . Thyroid disease Sister   . Cholelithiasis Sister     History   Social History  . Marital Status: Married    Spouse Name: N/A    Number of Children: N/A  . Years of Education: N/A   Occupational History  . Not on file.   Social History Main Topics  . Smoking status: Never Smoker   . Smokeless tobacco: Never Used  . Alcohol Use: No  . Drug Use: No  . Sexually Active: Yes -- Female partner(s)   Other Topics Concern  . Not on file   Social History Narrative  . No narrative on file    Current Outpatient Prescriptions on File Prior to Visit  Medication Sig Dispense Refill  . albuterol (PROVENTIL HFA;VENTOLIN HFA) 108 (90 BASE) MCG/ACT inhaler  Inhale 2 puffs into the lungs every 6 (six) hours as needed for wheezing.  1 Inhaler  1    No Known Allergies  Review of Systems  Review of Systems  Constitutional: Negative for fever and malaise/fatigue.  HENT: Negative for congestion.   Eyes: Negative for discharge.  Respiratory: Negative for shortness of breath.   Cardiovascular: Negative for chest pain, palpitations and leg swelling.  Gastrointestinal: Negative for nausea, abdominal pain and diarrhea.  Genitourinary: Negative for dysuria.  Musculoskeletal: Negative for falls.  Skin: Negative for rash.  Neurological: Negative for loss of consciousness and headaches.  Endo/Heme/Allergies: Negative for polydipsia. Bruises/bleeds easily.  Psychiatric/Behavioral: Negative for depression and suicidal ideas. The patient is not nervous/anxious and does not have insomnia.      Objective  BP 104/70  Pulse 75  Temp 99.2 F (37.3 C) (Temporal)  Ht 5' 2.75" (1.594 m)  Wt 111 lb 1.9 oz (50.404 kg)  BMI 19.84 kg/m2  SpO2 99%  LMP 09/21/2012  Physical Exam  Physical Exam  Constitutional: She is oriented to person, place, and time and well-developed, well-nourished, and in no distress. No distress.  HENT:  Head: Normocephalic and atraumatic.  Eyes: Conjunctivae normal are normal.  Neck: Neck supple. No thyromegaly present.  Cardiovascular: Normal rate, regular rhythm and normal heart sounds.   No murmur heard. Pulmonary/Chest: Effort normal and breath sounds normal. She has no wheezes.  Abdominal: She exhibits no distension and no mass.  Musculoskeletal: She exhibits no edema.  Lymphadenopathy:    She has no cervical adenopathy.  Neurological: She is alert and oriented to person, place, and time.  Skin: Skin is warm and dry. No rash noted. She is not diaphoretic.  Psychiatric: Memory, affect and judgment normal.    Lab Results  Component Value Date   TSH 1.17 02/29/2012   Lab Results  Component Value Date   WBC 5.2 07/14/2012   HGB 13.6 07/14/2012   HCT 40.9 07/14/2012   MCV 94.4 07/14/2012   PLT 86.0* 07/14/2012   Lab Results  Component Value Date   CREATININE 0.6 02/29/2012   BUN 15 02/29/2012   NA 141 02/29/2012   K 4.6 02/29/2012   CL 107 02/29/2012   CO2 26 02/29/2012   Lab Results  Component Value Date   ALT 13 02/29/2012   AST 21 02/29/2012   ALKPHOS 52 02/29/2012   BILITOT 0.7 02/29/2012   Lab Results  Component Value Date   CHOL 114 02/29/2012   Lab Results  Component Value Date   HDL 44.50 02/29/2012   Lab Results  Component Value Date   LDLCALC 55 02/29/2012   Lab Results  Component Value Date   TRIG 71.0 02/29/2012   Lab Results  Component Value Date   CHOLHDL 3 02/29/2012     Assessment & Plan  Thrombocytopenia Following with [facility] now. Work up unremarkable per patient will request records and patient  has asked [place] to repeat her platelet count for her in February that is ordered today  Asthma Her exacerbation has calmed dow, she reports feeling very good on the steroid dosing.  Allergic state May continue Loratadine daily or prn as symptoms continue to improve

## 2012-09-27 NOTE — Assessment & Plan Note (Signed)
Her exacerbation has calmed dow, she reports feeling very good on the steroid dosing.

## 2012-09-27 NOTE — Assessment & Plan Note (Signed)
May continue Loratadine daily or prn as symptoms continue to improve

## 2012-11-14 ENCOUNTER — Other Ambulatory Visit (INDEPENDENT_AMBULATORY_CARE_PROVIDER_SITE_OTHER): Payer: BC Managed Care – PPO

## 2012-11-14 DIAGNOSIS — D696 Thrombocytopenia, unspecified: Secondary | ICD-10-CM

## 2012-11-14 LAB — CBC
MCV: 94.1 fl (ref 78.0–100.0)
RBC: 4.17 Mil/uL (ref 3.87–5.11)
RDW: 12.6 % (ref 11.5–14.6)
WBC: 4.2 10*3/uL — ABNORMAL LOW (ref 4.5–10.5)

## 2012-11-14 NOTE — Progress Notes (Signed)
Labs only

## 2012-11-15 ENCOUNTER — Telehealth: Payer: Self-pay | Admitting: Family Medicine

## 2012-11-15 NOTE — Telephone Encounter (Signed)
Faxed 11/14/12 lab results to Dr Jolyne Loa per patient request.

## 2012-11-15 NOTE — Telephone Encounter (Signed)
As soon as Dr Abner Greenspan addresses these we will inform patient

## 2012-11-15 NOTE — Telephone Encounter (Signed)
Patient is asking about her 11/14/12 platelet count. There are 2 results on her chart.

## 2012-11-16 ENCOUNTER — Telehealth: Payer: Self-pay

## 2012-11-16 NOTE — Telephone Encounter (Signed)
April Bridges with April Bridges left a message stating to return his call to discuss pts platlet count?  Left a message for April Bridges to return my call

## 2012-11-18 NOTE — Telephone Encounter (Signed)
Patient called to check to see if Dr. Abner Greenspan had heard from the lab yet. Patient did mention she uses MyChart so it's okay to correspond with her through MyChart.

## 2012-11-21 NOTE — Telephone Encounter (Signed)
Looks like Dr Abner Greenspan released these on 11-18-12 at 5.  Madelaine Bhat was trying to call Wilkie Aye in the lab at OR

## 2012-11-22 ENCOUNTER — Encounter: Payer: Self-pay | Admitting: Family Medicine

## 2012-11-22 NOTE — Telephone Encounter (Signed)
Please advise 

## 2013-06-26 ENCOUNTER — Encounter: Payer: Self-pay | Admitting: Family Medicine

## 2013-06-26 ENCOUNTER — Encounter: Payer: BC Managed Care – PPO | Admitting: Family Medicine

## 2013-06-26 ENCOUNTER — Other Ambulatory Visit (HOSPITAL_COMMUNITY)
Admission: RE | Admit: 2013-06-26 | Discharge: 2013-06-26 | Disposition: A | Discharge status: Home / Self Care | Payer: BC Managed Care – PPO | Source: Ambulatory Visit | Attending: Family Medicine | Admitting: Family Medicine

## 2013-06-26 ENCOUNTER — Ambulatory Visit (INDEPENDENT_AMBULATORY_CARE_PROVIDER_SITE_OTHER): Payer: BC Managed Care – PPO | Admitting: Family Medicine

## 2013-06-26 VITALS — BP 102/64 | HR 66 | Temp 98.2°F | Ht 62.75 in | Wt 116.1 lb

## 2013-06-26 DIAGNOSIS — Z23 Encounter for immunization: Secondary | ICD-10-CM

## 2013-06-26 DIAGNOSIS — Z0000 Encounter for general adult medical examination without abnormal findings: Secondary | ICD-10-CM

## 2013-06-26 DIAGNOSIS — E785 Hyperlipidemia, unspecified: Secondary | ICD-10-CM

## 2013-06-26 DIAGNOSIS — D696 Thrombocytopenia, unspecified: Secondary | ICD-10-CM

## 2013-06-26 DIAGNOSIS — R739 Hyperglycemia, unspecified: Secondary | ICD-10-CM

## 2013-06-26 DIAGNOSIS — Z124 Encounter for screening for malignant neoplasm of cervix: Secondary | ICD-10-CM

## 2013-06-26 DIAGNOSIS — R7309 Other abnormal glucose: Secondary | ICD-10-CM

## 2013-06-26 DIAGNOSIS — N63 Unspecified lump in unspecified breast: Secondary | ICD-10-CM

## 2013-06-26 DIAGNOSIS — G43909 Migraine, unspecified, not intractable, without status migrainosus: Secondary | ICD-10-CM

## 2013-06-26 DIAGNOSIS — N6311 Unspecified lump in the right breast, upper outer quadrant: Secondary | ICD-10-CM

## 2013-06-26 DIAGNOSIS — Z01419 Encounter for gynecological examination (general) (routine) without abnormal findings: Secondary | ICD-10-CM | POA: Insufficient documentation

## 2013-06-26 HISTORY — DX: Migraine, unspecified, not intractable, without status migrainosus: G43.909

## 2013-06-26 LAB — RENAL FUNCTION PANEL
CO2: 31 mEq/L (ref 19–32)
Chloride: 104 mEq/L (ref 96–112)
Creat: 0.73 mg/dL (ref 0.50–1.10)
Phosphorus: 2.8 mg/dL (ref 2.3–4.6)
Potassium: 3.9 mEq/L (ref 3.5–5.3)

## 2013-06-26 LAB — LIPID PANEL
HDL: 49 mg/dL (ref >39)
LDL Cholesterol: 41 mg/dL (ref 0–99)
Total CHOL/HDL Ratio: 2.3 Ratio
Triglycerides: 120 mg/dL (ref <150)
VLDL: 24 mg/dL (ref 0–40)

## 2013-06-26 LAB — HEPATIC FUNCTION PANEL
AST: 17 U/L (ref 0–37)
Albumin: 4.4 g/dL (ref 3.5–5.2)
Alkaline Phosphatase: 49 U/L (ref 39–117)
Bilirubin, Direct: 0.1 mg/dL (ref 0.0–0.3)
Indirect Bilirubin: 0.5 mg/dL (ref 0.0–0.9)
Total Bilirubin: 0.6 mg/dL (ref 0.3–1.2)

## 2013-06-26 NOTE — Patient Instructions (Addendum)
Consider a daily probiotic, Digestive Advantage or a generic  Salon pas cream after moist heat   Preventive Care for Adults, Female A healthy lifestyle and preventive care can promote health and wellness. Preventive health guidelines for women include the following key practices.  A routine yearly physical is a good way to check with your caregiver about your health and preventive screening. It is a chance to share any concerns and updates on your health, and to receive a thorough exam.  Visit your dentist for a routine exam and preventive care every 6 months. Brush your teeth twice a day and floss once a day. Good oral hygiene prevents tooth decay and gum disease.  The frequency of eye exams is based on your age, health, family medical history, use of contact lenses, and other factors. Follow your caregiver's recommendations for frequency of eye exams.  Eat a healthy diet. Foods like vegetables, fruits, whole grains, low-fat dairy products, and lean protein foods contain the nutrients you need without too many calories. Decrease your intake of foods high in solid fats, added sugars, and salt. Eat the right amount of calories for you.Get information about a proper diet from your caregiver, if necessary.  Regular physical exercise is one of the most important things you can do for your health. Most adults should get at least 150 minutes of moderate-intensity exercise (any activity that increases your heart rate and causes you to sweat) each week. In addition, most adults need muscle-strengthening exercises on 2 or more days a week.  Maintain a healthy weight. The body mass index (BMI) is a screening tool to identify possible weight problems. It provides an estimate of body fat based on height and weight. Your caregiver can help determine your BMI, and can help you achieve or maintain a healthy weight.For adults 20 years and older:  A BMI below 18.5 is considered underweight.  A BMI of 18.5 to  24.9 is normal.  A BMI of 25 to 29.9 is considered overweight.  A BMI of 30 and above is considered obese.  Maintain normal blood lipids and cholesterol levels by exercising and minimizing your intake of saturated fat. Eat a balanced diet with plenty of fruit and vegetables. Blood tests for lipids and cholesterol should begin at age 41 and be repeated every 5 years. If your lipid or cholesterol levels are high, you are over 50, or you are at high risk for heart disease, you may need your cholesterol levels checked more frequently.Ongoing high lipid and cholesterol levels should be treated with medicines if diet and exercise are not effective.  If you smoke, find out from your caregiver how to quit. If you do not use tobacco, do not start.  If you are pregnant, do not drink alcohol. If you are breastfeeding, be very cautious about drinking alcohol. If you are not pregnant and choose to drink alcohol, do not exceed 1 drink per day. One drink is considered to be 12 ounces (355 mL) of beer, 5 ounces (148 mL) of wine, or 1.5 ounces (44 mL) of liquor.  Avoid use of street drugs. Do not share needles with anyone. Ask for help if you need support or instructions about stopping the use of drugs.  High blood pressure causes heart disease and increases the risk of stroke. Your blood pressure should be checked at least every 1 to 2 years. Ongoing high blood pressure should be treated with medicines if weight loss and exercise are not effective.  If you are  77 to 42 years old, ask your caregiver if you should take aspirin to prevent strokes.  Diabetes screening involves taking a blood sample to check your fasting blood sugar level. This should be done once every 3 years, after age 56, if you are within normal weight and without risk factors for diabetes. Testing should be considered at a younger age or be carried out more frequently if you are overweight and have at least 1 risk factor for diabetes.  Breast  cancer screening is essential preventive care for women. You should practice "breast self-awareness." This means understanding the normal appearance and feel of your breasts and may include breast self-examination. Any changes detected, no matter how small, should be reported to a caregiver. Women in their 21s and 30s should have a clinical breast exam (CBE) by a caregiver as part of a regular health exam every 1 to 3 years. After age 27, women should have a CBE every year. Starting at age 20, women should consider having a mammography (breast X-ray test) every year. Women who have a family history of breast cancer should talk to their caregiver about genetic screening. Women at a high risk of breast cancer should talk to their caregivers about having magnetic resonance imaging (MRI) and a mammography every year.  The Pap test is a screening test for cervical cancer. A Pap test can show cell changes on the cervix that might become cervical cancer if left untreated. A Pap test is a procedure in which cells are obtained and examined from the lower end of the uterus (cervix).  Women should have a Pap test starting at age 22.  Between ages 65 and 10, Pap tests should be repeated every 2 years.  Beginning at age 13, you should have a Pap test every 3 years as long as the past 3 Pap tests have been normal.  Some women have medical problems that increase the chance of getting cervical cancer. Talk to your caregiver about these problems. It is especially important to talk to your caregiver if a new problem develops soon after your last Pap test. In these cases, your caregiver may recommend more frequent screening and Pap tests.  The above recommendations are the same for women who have or have not gotten the vaccine for human papillomavirus (HPV).  If you had a hysterectomy for a problem that was not cancer or a condition that could lead to cancer, then you no longer need Pap tests. Even if you no longer need  a Pap test, a regular exam is a good idea to make sure no other problems are starting.  If you are between ages 5 and 53, and you have had normal Pap tests going back 10 years, you no longer need Pap tests. Even if you no longer need a Pap test, a regular exam is a good idea to make sure no other problems are starting.  If you have had past treatment for cervical cancer or a condition that could lead to cancer, you need Pap tests and screening for cancer for at least 20 years after your treatment.  If Pap tests have been discontinued, risk factors (such as a new sexual partner) need to be reassessed to determine if screening should be resumed.  The HPV test is an additional test that may be used for cervical cancer screening. The HPV test looks for the virus that can cause the cell changes on the cervix. The cells collected during the Pap test can be tested  for HPV. The HPV test could be used to screen women aged 37 years and older, and should be used in women of any age who have unclear Pap test results. After the age of 39, women should have HPV testing at the same frequency as a Pap test.  Colorectal cancer can be detected and often prevented. Most routine colorectal cancer screening begins at the age of 62 and continues through age 11. However, your caregiver may recommend screening at an earlier age if you have risk factors for colon cancer. On a yearly basis, your caregiver may provide home test kits to check for hidden blood in the stool. Use of a small camera at the end of a tube, to directly examine the colon (sigmoidoscopy or colonoscopy), can detect the earliest forms of colorectal cancer. Talk to your caregiver about this at age 44, when routine screening begins. Direct examination of the colon should be repeated every 5 to 10 years through age 27, unless early forms of pre-cancerous polyps or small growths are found.  Hepatitis C blood testing is recommended for all people born from [birth year]  through 1965 and any individual with known risks for hepatitis C.  Practice safe sex. Use condoms and avoid high-risk sexual practices to reduce the spread of sexually transmitted infections (STIs). STIs include gonorrhea, chlamydia, syphilis, trichomonas, herpes, HPV, and human immunodeficiency virus (HIV). Herpes, HIV, and HPV are viral illnesses that have no cure. They can result in disability, cancer, and death. Sexually active women aged 93 and younger should be checked for chlamydia. Older women with new or multiple partners should also be tested for chlamydia. Testing for other STIs is recommended if you are sexually active and at increased risk.  Osteoporosis is a disease in which the bones lose minerals and strength with aging. This can result in serious bone fractures. The risk of osteoporosis can be identified using a bone density scan. Women ages 43 and over and women at risk for fractures or osteoporosis should discuss screening with their caregivers. Ask your caregiver whether you should take a calcium supplement or vitamin D to reduce the rate of osteoporosis.  Menopause can be associated with physical symptoms and risks. Hormone replacement therapy is available to decrease symptoms and risks. You should talk to your caregiver about whether hormone replacement therapy is right for you.  Use sunscreen with sun protection factor (SPF) of 30 or more. Apply sunscreen liberally and repeatedly throughout the day. You should seek shade when your shadow is shorter than you. Protect yourself by wearing long sleeves, pants, a wide-brimmed hat, and sunglasses year round, whenever you are outdoors.  Once a month, do a whole body skin exam, using a mirror to look at the skin on your back. Notify your caregiver of new moles, moles that have irregular borders, moles that are larger than a pencil eraser, or moles that have changed in shape or color.  Stay current with required immunizations.  Influenza.  You need a dose every fall (or winter). The composition of the flu vaccine changes each year, so being vaccinated once is not enough.  Pneumococcal polysaccharide. You need 1 to 2 doses if you smoke cigarettes or if you have certain chronic medical conditions. You need 1 dose at age 69 (or older) if you have never been vaccinated.  Tetanus, diphtheria, pertussis (Tdap, Td). Get 1 dose of Tdap vaccine if you are younger than age 9, are over 42 and have contact with an infant, are a healthcare  worker, are pregnant, or simply want to be protected from whooping cough. After that, you need a Td booster dose every 10 years. Consult your caregiver if you have not had at least 3 tetanus and diphtheria-containing shots sometime in your life or have a deep or dirty wound.  HPV. You need this vaccine if you are a woman age 59 or younger. The vaccine is given in 3 doses over 6 months.  Measles, mumps, rubella (MMR). You need at least 1 dose of MMR if you were born in 1957 or later. You may also need a second dose.  Meningococcal. If you are age 77 to 65 and a first-year college student living in a residence hall, or have one of several medical conditions, you need to get vaccinated against meningococcal disease. You may also need additional booster doses.  Zoster (shingles). If you are age 15 or older, you should get this vaccine.  Varicella (chickenpox). If you have never had chickenpox or you were vaccinated but received only 1 dose, talk to your caregiver to find out if you need this vaccine.  Hepatitis A. You need this vaccine if you have a specific risk factor for hepatitis A virus infection or you simply wish to be protected from this disease. The vaccine is usually given as 2 doses, 6 to 18 months apart.  Hepatitis B. You need this vaccine if you have a specific risk factor for hepatitis B virus infection or you simply wish to be protected from this disease. The vaccine is given in 3 doses, usually  over 6 months. Preventive Services / Frequency Ages 44 to 14  Blood pressure check.** / Every 1 to 2 years.  Lipid and cholesterol check.** / Every 5 years beginning at age 56.  Clinical breast exam.** / Every 3 years for women in their 48s and 80s.  Pap test.** / Every 2 years from ages 42 through 25. Every 3 years starting at age 59 through age 58 or 37 with a history of 3 consecutive normal Pap tests.  HPV screening.** / Every 3 years from ages 34 through ages 20 to 62 with a history of 3 consecutive normal Pap tests.  Hepatitis C blood test.** / For any individual with known risks for hepatitis C.  Skin self-exam. / Monthly.  Influenza immunization.** / Every year.  Pneumococcal polysaccharide immunization.** / 1 to 2 doses if you smoke cigarettes or if you have certain chronic medical conditions.  Tetanus, diphtheria, pertussis (Tdap, Td) immunization. / A one-time dose of Tdap vaccine. After that, you need a Td booster dose every 10 years.  HPV immunization. / 3 doses over 6 months, if you are 34 and younger.  Measles, mumps, rubella (MMR) immunization. / You need at least 1 dose of MMR if you were born in 1957 or later. You may also need a second dose.  Meningococcal immunization. / 1 dose if you are age 24 to 42 and a first-year college student living in a residence hall, or have one of several medical conditions, you need to get vaccinated against meningococcal disease. You may also need additional booster doses.  Varicella immunization.** / Consult your caregiver.  Hepatitis A immunization.** / Consult your caregiver. 2 doses, 6 to 18 months apart.  Hepatitis B immunization.** / Consult your caregiver. 3 doses usually over 6 months. Ages 59 to 63  Blood pressure check.** / Every 1 to 2 years.  Lipid and cholesterol check.** / Every 5 years beginning at age 82.  Clinical breast exam.** / Every year after age 63.  Mammogram.** / Every year beginning at age 66 and  continuing for as long as you are in good health. Consult with your caregiver.  Pap test.** / Every 3 years starting at age 60 through age 51 or 67 with a history of 3 consecutive normal Pap tests.  HPV screening.** / Every 3 years from ages 93 through ages 53 to 26 with a history of 3 consecutive normal Pap tests.  Fecal occult blood test (FOBT) of stool. / Every year beginning at age 93 and continuing until age 25. You may not need to do this test if you get a colonoscopy every 10 years.  Flexible sigmoidoscopy or colonoscopy.** / Every 5 years for a flexible sigmoidoscopy or every 10 years for a colonoscopy beginning at age 25 and continuing until age 99.  Hepatitis C blood test.** / For all people born from [birth year] through 1965 and any individual with known risks for hepatitis C.  Skin self-exam. / Monthly.  Influenza immunization.** / Every year.  Pneumococcal polysaccharide immunization.** / 1 to 2 doses if you smoke cigarettes or if you have certain chronic medical conditions.  Tetanus, diphtheria, pertussis (Tdap, Td) immunization.** / A one-time dose of Tdap vaccine. After that, you need a Td booster dose every 10 years.  Measles, mumps, rubella (MMR) immunization. / You need at least 1 dose of MMR if you were born in 1957 or later. You may also need a second dose.  Varicella immunization.** / Consult your caregiver.  Meningococcal immunization.** / Consult your caregiver.  Hepatitis A immunization.** / Consult your caregiver. 2 doses, 6 to 18 months apart.  Hepatitis B immunization.** / Consult your caregiver. 3 doses, usually over 6 months. Ages 19 and over  Blood pressure check.** / Every 1 to 2 years.  Lipid and cholesterol check.** / Every 5 years beginning at age 36.  Clinical breast exam.** / Every year after age 37.  Mammogram.** / Every year beginning at age 38 and continuing for as long as you are in good health. Consult with your caregiver.  Pap test.** / Every  3 years starting at age 4 through age 17 or 65 with a 3 consecutive normal Pap tests. Testing can be stopped between 65 and 70 with 3 consecutive normal Pap tests and no abnormal Pap or HPV tests in the past 10 years.  HPV screening.** / Every 3 years from ages 69 through ages 32 or 63 with a history of 3 consecutive normal Pap tests. Testing can be stopped between 65 and 70 with 3 consecutive normal Pap tests and no abnormal Pap or HPV tests in the past 10 years.  Fecal occult blood test (FOBT) of stool. / Every year beginning at age 101 and continuing until age 71. You may not need to do this test if you get a colonoscopy every 10 years.  Flexible sigmoidoscopy or colonoscopy.** / Every 5 years for a flexible sigmoidoscopy or every 10 years for a colonoscopy beginning at age 78 and continuing until age 69.  Hepatitis C blood test.** / For all people born from [birth year] through 1965 and any individual with known risks for hepatitis C.  Osteoporosis screening.** / A one-time screening for women ages 9 and over and women at risk for fractures or osteoporosis.  Skin self-exam. / Monthly.  Influenza immunization.** / Every year.  Pneumococcal polysaccharide immunization.** / 1 dose at age 14 (or older) if you have never been vaccinated.  Tetanus, diphtheria, pertussis (Tdap, Td) immunization. / A one-time dose of Tdap vaccine if you are over 65 and have contact with an infant, are a Dietitian, or simply want to be protected from whooping cough. After that, you need a Td booster dose every 10 years.  Varicella immunization.** / Consult your caregiver.  Meningococcal immunization.** / Consult your caregiver.  Hepatitis A immunization.** / Consult your caregiver. 2 doses, 6 to 18 months apart.  Hepatitis B immunization.** / Check with your caregiver. 3 doses, usually over 6 months. ** Family history and personal history of risk and conditions may change your caregiver's  recommendations. Document Released: 11/03/2001 Document Revised: 11/30/2011 Document Reviewed: 02/02/2011 [facility] Patient Information 2014 [place], [place].

## 2013-06-26 NOTE — Assessment & Plan Note (Addendum)
LMP 06/15/13. Pap today, no concerns on exam
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
## 2013-06-26 NOTE — Assessment & Plan Note (Addendum)
renal today with normal sugar

## 2013-06-26 NOTE — Assessment & Plan Note (Signed)
Benign cyst, now due just for screening annual MGM encouraged 3D
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
## 2013-06-27 LAB — CBC
MCV: 88.6 fL (ref 78.0–100.0)
Platelets: 126 10*3/uL — ABNORMAL LOW (ref 150–400)
RBC: 4.37 MIL/uL (ref 3.87–5.11)
WBC: 5 10*3/uL (ref 4.0–10.5)

## 2013-07-01 ENCOUNTER — Encounter: Payer: Self-pay | Admitting: Family Medicine

## 2013-07-01 DIAGNOSIS — G43909 Migraine, unspecified, not intractable, without status migrainosus: Secondary | ICD-10-CM | POA: Insufficient documentation

## 2013-07-01 DIAGNOSIS — E785 Hyperlipidemia, unspecified: Secondary | ICD-10-CM

## 2013-07-01 HISTORY — DX: Hyperlipidemia, unspecified: E78.5

## 2013-07-01 NOTE — Assessment & Plan Note (Signed)
Given flu shot today, encouraged heart healthy diet and regular exercise, is doing well, fasting labs performed today.

## 2013-07-01 NOTE — Assessment & Plan Note (Signed)
Doing well 

## 2013-07-01 NOTE — Assessment & Plan Note (Signed)
Avoid trans fats, maintain exercise, add krill oil caps

## 2014-06-28 ENCOUNTER — Ambulatory Visit (INDEPENDENT_AMBULATORY_CARE_PROVIDER_SITE_OTHER): Payer: BC Managed Care – PPO | Admitting: Family Medicine

## 2014-06-28 ENCOUNTER — Encounter: Payer: Self-pay | Admitting: Family Medicine

## 2014-06-28 VITALS — BP 112/62 | HR 77 | Temp 98.1°F | Ht 62.75 in | Wt 122.4 lb

## 2014-06-28 DIAGNOSIS — G43809 Other migraine, not intractable, without status migrainosus: Secondary | ICD-10-CM

## 2014-06-28 DIAGNOSIS — E785 Hyperlipidemia, unspecified: Secondary | ICD-10-CM

## 2014-06-28 DIAGNOSIS — F411 Generalized anxiety disorder: Secondary | ICD-10-CM

## 2014-06-28 DIAGNOSIS — M542 Cervicalgia: Secondary | ICD-10-CM

## 2014-06-28 DIAGNOSIS — Z0000 Encounter for general adult medical examination without abnormal findings: Secondary | ICD-10-CM

## 2014-06-28 DIAGNOSIS — D696 Thrombocytopenia, unspecified: Secondary | ICD-10-CM

## 2014-06-28 LAB — CBC
HEMATOCRIT: 40.9 % (ref 36.0–46.0)
HEMOGLOBIN: 13.9 g/dL (ref 12.0–15.0)
MCHC: 33.9 g/dL (ref 30.0–36.0)
MCV: 92.8 fl (ref 78.0–100.0)
Platelets: 162 10*3/uL (ref 150.0–400.0)
RBC: 4.41 Mil/uL (ref 3.87–5.11)
RDW: 12.3 % (ref 11.5–15.5)
WBC: 5.9 10*3/uL (ref 4.0–10.5)

## 2014-06-28 LAB — LIPID PANEL
CHOL/HDL RATIO: 3
Cholesterol: 150 mg/dL (ref 0–200)
HDL: 48.5 mg/dL (ref >39.00)
LDL Cholesterol: 78 mg/dL (ref 0–99)
NONHDL: 101.5
Triglycerides: 119 mg/dL (ref 0.0–149.0)
VLDL: 23.8 mg/dL (ref 0.0–40.0)

## 2014-06-28 LAB — RENAL FUNCTION PANEL
ALBUMIN: 3.8 g/dL (ref 3.5–5.2)
BUN: 16 mg/dL (ref 6–23)
CALCIUM: 9.3 mg/dL (ref 8.4–10.5)
CHLORIDE: 103 meq/L (ref 96–112)
CO2: 28 meq/L (ref 19–32)
Creatinine, Ser: 0.7 mg/dL (ref 0.4–1.2)
GFR: 90.83 mL/min (ref >60.00)
Glucose, Bld: 78 mg/dL (ref 70–99)
POTASSIUM: 3.8 meq/L (ref 3.5–5.1)
Phosphorus: 2.9 mg/dL (ref 2.3–4.6)
Sodium: 139 mEq/L (ref 135–145)

## 2014-06-28 LAB — HEPATIC FUNCTION PANEL
ALK PHOS: 52 U/L (ref 39–117)
ALT: 20 U/L (ref 0–35)
AST: 25 U/L (ref 0–37)
Albumin: 3.8 g/dL (ref 3.5–5.2)
Bilirubin, Direct: 0.1 mg/dL (ref 0.0–0.3)
Total Bilirubin: 0.7 mg/dL (ref 0.2–1.2)
Total Protein: 7.4 g/dL (ref 6.0–8.3)

## 2014-06-28 LAB — TSH: TSH: 1.6 u[IU]/mL (ref 0.35–4.50)

## 2014-06-28 NOTE — Patient Instructions (Addendum)
Salon Pas patches or gel for pain For HA 64 oz of clear fluids and proteins roughly every 4-5 hours   Cholesterol Cholesterol is a white, waxy, fat-like substance needed by your body in small amounts. The liver makes all the cholesterol you need. Cholesterol is carried from the liver by the blood through the blood vessels. Deposits of cholesterol (plaque) may build up on blood vessel walls. These make the arteries narrower and stiffer. Cholesterol plaques increase the risk for heart attack and stroke.  You cannot feel your cholesterol level even if it is very high. The only way to know it is high is with a blood test. Once you know your cholesterol levels, you should keep a record of the test results. Work with your health care provider to keep your levels in the desired range.  WHAT DO THE RESULTS MEAN?  Total cholesterol is a rough measure of all the cholesterol in your blood.   LDL is the so-called bad cholesterol. This is the type that deposits cholesterol in the walls of the arteries. You want this level to be low.   HDL is the good cholesterol because it cleans the arteries and carries the LDL away. You want this level to be high.  Triglycerides are fat that the body can either burn for energy or store. High levels are closely linked to heart disease.  WHAT ARE THE DESIRED LEVELS OF CHOLESTEROL?  Total cholesterol below 200.   LDL below 100 for people at risk, below 70 for those at very high risk.   HDL above 50 is good, above 60 is best.   Triglycerides below 150.  HOW CAN I LOWER MY CHOLESTEROL?  Diet. Follow your diet programs as directed by your health care provider.   Choose fish or white meat chicken and [place], roasted or baked. Limit fatty cuts of red meat, fried foods, and processed meats, such as sausage and lunch meats.   Eat lots of fresh fruits and vegetables.  Choose whole grains, beans, pasta, potatoes, and cereals.   Use only small amounts of olive,  corn, or canola oils.   Avoid butter, mayonnaise, shortening, or palm kernel oils.  Avoid foods with trans fats.   Drink skim or nonfat milk and eat low-fat or nonfat yogurt and cheeses. Avoid whole milk, cream, ice cream, egg yolks, and full-fat cheeses.   Healthy desserts include angel food cake, ginger snaps, animal crackers, hard candy, popsicles, and low-fat or nonfat frozen yogurt. Avoid pastries, cakes, pies, and cookies.   Exercise. Follow your exercise programs as directed by your health care provider.   A regular program helps decrease LDL and raise HDL.   A regular program helps with weight control.   Do things that increase your activity level like gardening, walking, or taking the stairs. Ask your health care provider about how you can be more active in your daily life.   Medicine. Take medicine only as directed by your health care provider.   Medicine may be prescribed by your health care provider to help lower cholesterol and decrease the risk for heart disease.   If you have several risk factors, you may need medicine even if your levels are normal. Document Released: 06/02/2001 Document Revised: 01/22/2014 Document Reviewed: 06/21/2013 [facility] Patient Information 2015 [place], [place]. This information is not intended to replace advice given to you by your health care provider. Make sure you discuss any questions you have with your health care provider.

## 2014-06-28 NOTE — Assessment & Plan Note (Signed)
Worse with increased with increased neck and shoulder pain. Encouraged increased hydration, 64 ounces of clear fluids daily. Minimize alcohol and caffeine. Eat small frequent meals with lean proteins and complex carbs. Avoid high and low blood sugars. Get adequate sleep, 7-8 hours a night. Needs exercise daily preferably in the morning.

## 2014-06-28 NOTE — Progress Notes (Signed)
Pre visit review using our clinic review tool, if applicable. No additional management support is needed unless otherwise documented below in the visit note. 

## 2014-07-01 ENCOUNTER — Encounter: Payer: Self-pay | Admitting: Family Medicine

## 2014-07-01 DIAGNOSIS — M542 Cervicalgia: Secondary | ICD-10-CM

## 2014-07-01 DIAGNOSIS — F411 Generalized anxiety disorder: Secondary | ICD-10-CM

## 2014-07-01 DIAGNOSIS — E785 Hyperlipidemia, unspecified: Secondary | ICD-10-CM | POA: Insufficient documentation

## 2014-07-01 HISTORY — DX: Generalized anxiety disorder: F41.1

## 2014-07-01 HISTORY — DX: Cervicalgia: M54.2

## 2014-07-01 NOTE — Assessment & Plan Note (Signed)
Patient encouraged to maintain heart healthy diet, regular exercise, adequate sleep. Consider daily probiotics. Take medications as prescribed 

## 2014-07-01 NOTE — Assessment & Plan Note (Signed)
Is doing PT and acknowledges it is due to stress. Agrees to moist heat and gentle stretching as tolerated. May try NSAIDs and prescription meds as directed and report if symptoms worsen or seek immediate care. Consider chiropractic.

## 2014-07-01 NOTE — Progress Notes (Signed)
[name], female   DOB: [birth year], 43 y.o.   MRN: [id number] April Bridges [id number] 01/05/1971 07/01/2014      Progress Note-Follow Up  Subjective  Chief Complaint  Chief Complaint  Patient presents with  . Annual Exam    physical    HPI  Patient is a 43 year old female in today for routine medical care. She is in today for annual exam. Has been struggling with increased neck pain and shoulder pain. She's been doing PT and that has been somewhat helpful but unfortunately she has noted increased migraines as well. She notes it is largely related to increased anxiety and stress. Her son and family members have been sick. No recent illness. Denies CP/palp/SOB/HA/congestion/fevers/GI or GU c/o. Taking meds as prescribed  Past Medical History  Diagnosis Date  . Chicken pox as a child  . Mumps as a child  . Asthma     cats, dust, triggers  . Hyperglycemia 02/29/2012  . Gestational thrombocytopenia 02/29/2012  . Preventative health care 03/03/2012  . Atypical moles 03/03/2012  . Thrombocytopenia 02/29/2012    Possible anemia/low iron? In past per patient   . Breast lump on right side at 10 o'clock position 03/21/2012  . UTI (lower urinary tract infection) 04/14/2012  . Asthma exacerbation   . Allergic state 08/30/2012  . Asthma   . Migraine 06/26/2013  . Migraine 07/01/2013  . Hyperlipidemia 07/01/2013  . Anxiety state 07/01/2014  . Neck pain 07/01/2014    Past Surgical History  Procedure Laterality Date  . Cesarean section  1999, [id number]    X 3  . Hammer toe surgery  1992    right 5th toe  . Tubal ligation      Family History  Problem Relation Age of Onset  . Thyroid disease Father   . Depression Sister     related to cycle  . Muscular dystrophy Son     Donnajean Lopes  . Other Son     Neurogenic bladder  . Asthma Son     mild  . Stroke Maternal Grandmother   . Heart attack Maternal Grandfather     ?  Marland Kitchen Heart disease Maternal Grandfather   .  Diabetes Paternal Grandmother     type 2  . Hypertension Paternal Grandmother   . Obesity Paternal Grandmother   . Other Sister     gluten intolerate or celiac, rectocele repair x 2 with bowel adhesions.  . Stroke Sister 83    s/p infections and surgeries  . Depression Sister   . Fibromyalgia Sister   . Thyroid disease Sister   . Cholelithiasis Sister   . Celiac disease Son     History   Social History  . Marital Status: Married    Spouse Name: N/A    Number of Children: N/A  . Years of Education: N/A   Occupational History  . Not on file.   Social History Main Topics  . Smoking status: Never Smoker   . Smokeless tobacco: Never Used  . Alcohol Use: No  . Drug Use: No  . Sexual Activity: Yes    Partners: Male   Other Topics Concern  . Not on file   Social History Narrative  . No narrative on file    Current Outpatient Prescriptions on File Prior to Visit  Medication Sig Dispense Refill  . albuterol (PROVENTIL HFA;VENTOLIN HFA) 108 (90 BASE) MCG/ACT inhaler Inhale 2 puffs into the lungs every 6 (six) hours as needed for  wheezing.  1 Inhaler  1   No current facility-administered medications on file prior to visit.    No Known Allergies  Review of Systems  Review of Systems  Constitutional: Negative for fever and malaise/fatigue.  HENT: Negative for congestion.   Eyes: Negative for discharge.  Respiratory: Negative for shortness of breath.   Cardiovascular: Negative for chest pain, palpitations and leg swelling.  Gastrointestinal: Negative for nausea, abdominal pain and diarrhea.  Genitourinary: Negative for dysuria.  Musculoskeletal: Positive for neck pain. Negative for falls.  Skin: Negative for rash.  Neurological: Positive for headaches. Negative for loss of consciousness.  Endo/Heme/Allergies: Negative for polydipsia.  Psychiatric/Behavioral: Negative for depression and suicidal ideas. The patient is nervous/anxious. The patient does not have insomnia.      Objective  BP 112/62  Pulse 77  Temp(Src) 98.1 F (36.7 C) (Oral)  Ht 5' 2.75" (1.594 m)  Wt 122 lb 6.4 oz (55.52 kg)  BMI 21.85 kg/m2  SpO2 100%  LMP 06/18/2014  Physical Exam  Physical Exam  Constitutional: She is oriented to person, place, and time and well-developed, well-nourished, and in no distress. No distress.  HENT:  Head: Normocephalic and atraumatic.  Eyes: Conjunctivae are normal.  Neck: Neck supple. No thyromegaly present.  Cardiovascular: Normal rate, regular rhythm and normal heart sounds.   No murmur heard. Pulmonary/Chest: Effort normal and breath sounds normal. She has no wheezes.  Abdominal: She exhibits no distension and no mass.  Musculoskeletal: She exhibits no edema.  Lymphadenopathy:    She has no cervical adenopathy.  Neurological: She is alert and oriented to person, place, and time.  Skin: Skin is warm and dry. No rash noted. She is not diaphoretic.  Psychiatric: Memory, affect and judgment normal.    Lab Results  Component Value Date   TSH 1.60 06/28/2014   Lab Results  Component Value Date   WBC 5.9 06/28/2014   HGB 13.9 06/28/2014   HCT 40.9 06/28/2014   MCV 92.8 06/28/2014   PLT 162.0 06/28/2014   Lab Results  Component Value Date   CREATININE 0.7 06/28/2014   BUN 16 06/28/2014   NA 139 06/28/2014   K 3.8 06/28/2014   CL 103 06/28/2014   CO2 28 06/28/2014   Lab Results  Component Value Date   ALT 20 06/28/2014   AST 25 06/28/2014   ALKPHOS 52 06/28/2014   BILITOT 0.7 06/28/2014   Lab Results  Component Value Date   CHOL 150 06/28/2014   Lab Results  Component Value Date   HDL 48.50 06/28/2014   Lab Results  Component Value Date   LDLCALC 78 06/28/2014   Lab Results  Component Value Date   TRIG 119.0 06/28/2014   Lab Results  Component Value Date   CHOLHDL 3 06/28/2014     Assessment & Plan  Migraine Worse with increased with increased neck and shoulder pain. Encouraged increased hydration, 64 ounces of clear fluids  daily. Minimize alcohol and caffeine. Eat small frequent meals with lean proteins and complex carbs. Avoid high and low blood sugars. Get adequate sleep, 7-8 hours a night. Needs exercise daily preferably in the morning.  Preventative health care Patient encouraged to maintain heart healthy diet, regular exercise, adequate sleep. Consider daily probiotics. Take medications as prescribed  Anxiety state Is struggling with significant stress with ill family members including children. Declines meds but will let us know if worse  Neck pain Is doing PT and acknowledges it is due to stress. Agrees to moist  heat and gentle stretching as tolerated. May try NSAIDs and prescription meds as directed and report if symptoms worsen or seek immediate care. Consider chiropractic.   Thrombocytopenia resolved

## 2014-07-01 NOTE — Assessment & Plan Note (Signed)
resolved 
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
## 2015-07-01 ENCOUNTER — Ambulatory Visit (HOSPITAL_BASED_OUTPATIENT_CLINIC_OR_DEPARTMENT_OTHER)
Admission: RE | Admit: 2015-07-01 | Discharge: 2015-07-01 | Disposition: A | Discharge status: Home / Self Care | Payer: BLUE CROSS/BLUE SHIELD | Source: Ambulatory Visit | Attending: Family Medicine | Admitting: Family Medicine

## 2015-07-01 ENCOUNTER — Other Ambulatory Visit: Payer: Self-pay | Admitting: Family Medicine

## 2015-07-01 ENCOUNTER — Ambulatory Visit (INDEPENDENT_AMBULATORY_CARE_PROVIDER_SITE_OTHER): Payer: BLUE CROSS/BLUE SHIELD | Admitting: Family Medicine

## 2015-07-01 ENCOUNTER — Encounter: Payer: Self-pay | Admitting: Family Medicine

## 2015-07-01 VITALS — BP 94/70 | HR 62 | Temp 98.4°F | Ht 63.0 in | Wt 120.2 lb

## 2015-07-01 DIAGNOSIS — L989 Disorder of the skin and subcutaneous tissue, unspecified: Secondary | ICD-10-CM | POA: Diagnosis not present

## 2015-07-01 DIAGNOSIS — Z0000 Encounter for general adult medical examination without abnormal findings: Secondary | ICD-10-CM | POA: Diagnosis not present

## 2015-07-01 DIAGNOSIS — Z1239 Encounter for other screening for malignant neoplasm of breast: Secondary | ICD-10-CM | POA: Diagnosis not present

## 2015-07-01 DIAGNOSIS — F411 Generalized anxiety disorder: Secondary | ICD-10-CM

## 2015-07-01 DIAGNOSIS — R739 Hyperglycemia, unspecified: Secondary | ICD-10-CM

## 2015-07-01 DIAGNOSIS — D696 Thrombocytopenia, unspecified: Secondary | ICD-10-CM

## 2015-07-01 DIAGNOSIS — Z1231 Encounter for screening mammogram for malignant neoplasm of breast: Secondary | ICD-10-CM | POA: Diagnosis not present

## 2015-07-01 DIAGNOSIS — E785 Hyperlipidemia, unspecified: Secondary | ICD-10-CM | POA: Diagnosis not present

## 2015-07-01 HISTORY — DX: Disorder of the skin and subcutaneous tissue, unspecified: L98.9

## 2015-07-01 LAB — CBC
HEMATOCRIT: 43.9 % (ref 36.0–46.0)
Hemoglobin: 14.7 g/dL (ref 12.0–15.0)
MCHC: 33.4 g/dL (ref 30.0–36.0)
MCV: 92.6 fl (ref 78.0–100.0)
PLATELETS: 124 10*3/uL — AB (ref 150.0–400.0)
RBC: 4.74 Mil/uL (ref 3.87–5.11)
RDW: 12.3 % (ref 11.5–15.5)
WBC: 5.3 10*3/uL (ref 4.0–10.5)

## 2015-07-01 LAB — COMPREHENSIVE METABOLIC PANEL
ALBUMIN: 4.6 g/dL (ref 3.5–5.2)
ALK PHOS: 55 U/L (ref 39–117)
ALT: 14 U/L (ref 0–35)
AST: 19 U/L (ref 0–37)
BUN: 14 mg/dL (ref 6–23)
CALCIUM: 9.7 mg/dL (ref 8.4–10.5)
CHLORIDE: 104 meq/L (ref 96–112)
CO2: 29 mEq/L (ref 19–32)
CREATININE: 0.78 mg/dL (ref 0.40–1.20)
GFR: 85.08 mL/min (ref >60.00)
Glucose, Bld: 89 mg/dL (ref 70–99)
Potassium: 4.8 mEq/L (ref 3.5–5.1)
SODIUM: 140 meq/L (ref 135–145)
Total Bilirubin: 0.7 mg/dL (ref 0.2–1.2)
Total Protein: 7.3 g/dL (ref 6.0–8.3)

## 2015-07-01 LAB — LIPID PANEL
Cholesterol: 140 mg/dL (ref 0–200)
HDL: 57 mg/dL (ref >39.00)
LDL Cholesterol: 65 mg/dL (ref 0–99)
NonHDL: 83.14
TRIGLYCERIDES: 90 mg/dL (ref 0.0–149.0)
Total CHOL/HDL Ratio: 2
VLDL: 18 mg/dL (ref 0.0–40.0)

## 2015-07-01 LAB — TSH: TSH: 1.41 u[IU]/mL (ref 0.35–4.50)

## 2015-07-01 NOTE — Patient Instructions (Signed)
Preventive Care for Adults, Female A healthy lifestyle and preventive care can promote health and wellness. Preventive health guidelines for women include the following key practices.  A routine yearly physical is a good way to check with your health care provider about your health and preventive screening. It is a chance to share any concerns and updates on your health and to receive a thorough exam.  Visit your dentist for a routine exam and preventive care every 6 months. Brush your teeth twice a day and floss once a day. Good oral hygiene prevents tooth decay and gum disease.  The frequency of eye exams is based on your age, health, family medical history, use of contact lenses, and other factors. Follow your health care provider's recommendations for frequency of eye exams.  Eat a healthy diet. Foods like vegetables, fruits, whole grains, low-fat dairy products, and lean protein foods contain the nutrients you need without too many calories. Decrease your intake of foods high in solid fats, added sugars, and salt. Eat the right amount of calories for you.Get information about a proper diet from your health care provider, if necessary.  Regular physical exercise is one of the most important things you can do for your health. Most adults should get at least 150 minutes of moderate-intensity exercise (any activity that increases your heart rate and causes you to sweat) each week. In addition, most adults need muscle-strengthening exercises on 2 or more days a week.  Maintain a healthy weight. The body mass index (BMI) is a screening tool to identify possible weight problems. It provides an estimate of body fat based on height and weight. Your health care provider can find your BMI and can help you achieve or maintain a healthy weight.For adults 20 years and older:  A BMI below 18.5 is considered underweight.  A BMI of 18.5 to 24.9 is normal.  A BMI of 25 to 29.9 is considered overweight.  A  BMI of 30 and above is considered obese.  Maintain normal blood lipids and cholesterol levels by exercising and minimizing your intake of saturated fat. Eat a balanced diet with plenty of fruit and vegetables. Blood tests for lipids and cholesterol should begin at age 45 and be repeated every 5 years. If your lipid or cholesterol levels are high, you are over 50, or you are at high risk for heart disease, you may need your cholesterol levels checked more frequently.Ongoing high lipid and cholesterol levels should be treated with medicines if diet and exercise are not working.  If you smoke, find out from your health care provider how to quit. If you do not use tobacco, do not start.  Lung cancer screening is recommended for adults aged 45-80 years who are at high risk for developing lung cancer because of a history of smoking. A yearly low-dose CT scan of the lungs is recommended for people who have at least a 30-pack-year history of smoking and are a current smoker or have quit within the past 15 years. A pack year of smoking is smoking an average of 1 pack of cigarettes a day for 1 year (for example: 1 pack a day for 30 years or 2 packs a day for 15 years). Yearly screening should continue until the smoker has stopped smoking for at least 15 years. Yearly screening should be stopped for people who develop a health problem that would prevent them from having lung cancer treatment.  If you are pregnant, do not drink alcohol. If you are  breastfeeding, be very cautious about drinking alcohol. If you are not pregnant and choose to drink alcohol, do not have more than 1 drink per day. One drink is considered to be 12 ounces (355 mL) of beer, 5 ounces (148 mL) of wine, or 1.5 ounces (44 mL) of liquor.  Avoid use of street drugs. Do not share needles with anyone. Ask for help if you need support or instructions about stopping the use of drugs.  High blood pressure causes heart disease and increases the risk  of stroke. Your blood pressure should be checked at least every 1 to 2 years. Ongoing high blood pressure should be treated with medicines if weight loss and exercise do not work.  If you are 55-79 years old, ask your health care provider if you should take aspirin to prevent strokes.  Diabetes screening is done by taking a blood sample to check your blood glucose level after you have not eaten for a certain period of time (fasting). If you are not overweight and you do not have risk factors for diabetes, you should be screened once every 3 years starting at age 45. If you are overweight or obese and you are 40-70 years of age, you should be screened for diabetes every year as part of your cardiovascular risk assessment.  Breast cancer screening is essential preventive care for women. You should practice "breast self-awareness." This means understanding the normal appearance and feel of your breasts and may include breast self-examination. Any changes detected, no matter how small, should be reported to a health care provider. Women in their 20s and 30s should have a clinical breast exam (CBE) by a health care provider as part of a regular health exam every 1 to 3 years. After age 40, women should have a CBE every year. Starting at age 40, women should consider having a mammogram (breast X-ray test) every year. Women who have a family history of breast cancer should talk to their health care provider about genetic screening. Women at a high risk of breast cancer should talk to their health care providers about having an MRI and a mammogram every year.  Breast cancer gene (BRCA)-related cancer risk assessment is recommended for women who have family members with BRCA-related cancers. BRCA-related cancers include breast, ovarian, tubal, and peritoneal cancers. Having family members with these cancers may be associated with an increased risk for harmful changes (mutations) in the breast cancer genes BRCA1 and  BRCA2. Results of the assessment will determine the need for genetic counseling and BRCA1 and BRCA2 testing.  Your health care provider may recommend that you be screened regularly for cancer of the pelvic organs (ovaries, uterus, and vagina). This screening involves a pelvic examination, including checking for microscopic changes to the surface of your cervix (Pap test). You may be encouraged to have this screening done every 3 years, beginning at age 21.  For women ages 30-65, health care providers may recommend pelvic exams and Pap testing every 3 years, or they may recommend the Pap and pelvic exam, combined with testing for human papilloma virus (HPV), every 5 years. Some types of HPV increase your risk of cervical cancer. Testing for HPV may also be done on women of any age with unclear Pap test results.  Other health care providers may not recommend any screening for nonpregnant women who are considered low risk for pelvic cancer and who do not have symptoms. Ask your health care provider if a screening pelvic exam is right for   you.  If you have had past treatment for cervical cancer or a condition that could lead to cancer, you need Pap tests and screening for cancer for at least 20 years after your treatment. If Pap tests have been discontinued, your risk factors (such as having a new sexual partner) need to be reassessed to determine if screening should resume. Some women have medical problems that increase the chance of getting cervical cancer. In these cases, your health care provider may recommend more frequent screening and Pap tests.  Colorectal cancer can be detected and often prevented. Most routine colorectal cancer screening begins at the age of 50 years and continues through age 75 years. However, your health care provider may recommend screening at an earlier age if you have risk factors for colon cancer. On a yearly basis, your health care provider may provide home test kits to check  for hidden blood in the stool. Use of a small camera at the end of a tube, to directly examine the colon (sigmoidoscopy or colonoscopy), can detect the earliest forms of colorectal cancer. Talk to your health care provider about this at age 50, when routine screening begins. Direct exam of the colon should be repeated every 5-10 years through age 75 years, unless early forms of precancerous polyps or small growths are found.  People who are at an increased risk for hepatitis B should be screened for this virus. You are considered at high risk for hepatitis B if:  You were born in a country where hepatitis B occurs often. Talk with your health care provider about which countries are considered high risk.  Your parents were born in a high-risk country and you have not received a shot to protect against hepatitis B (hepatitis B vaccine).  You have HIV or AIDS.  You use needles to inject street drugs.  You live with, or have sex with, someone who has hepatitis B.  You get hemodialysis treatment.  You take certain medicines for conditions like cancer, organ transplantation, and autoimmune conditions.  Hepatitis C blood testing is recommended for all people born from 1945 through 1965 and any individual with known risks for hepatitis C.  Practice safe sex. Use condoms and avoid high-risk sexual practices to reduce the spread of sexually transmitted infections (STIs). STIs include gonorrhea, chlamydia, syphilis, trichomonas, herpes, HPV, and human immunodeficiency virus (HIV). Herpes, HIV, and HPV are viral illnesses that have no cure. They can result in disability, cancer, and death.  You should be screened for sexually transmitted illnesses (STIs) including gonorrhea and chlamydia if:  You are sexually active and are younger than 24 years.  You are older than 24 years and your health care provider tells you that you are at risk for this type of infection.  Your sexual activity has changed  since you were last screened and you are at an increased risk for chlamydia or gonorrhea. Ask your health care provider if you are at risk.  If you are at risk of being infected with HIV, it is recommended that you take a prescription medicine daily to prevent HIV infection. This is called preexposure prophylaxis (PrEP). You are considered at risk if:  You are sexually active and do not regularly use condoms or know the HIV status of your partner(s).  You take drugs by injection.  You are sexually active with a partner who has HIV.  Talk with your health care provider about whether you are at high risk of being infected with HIV. If   you choose to begin PrEP, you should first be tested for HIV. You should then be tested every 3 months for as long as you are taking PrEP.  Osteoporosis is a disease in which the bones lose minerals and strength with aging. This can result in serious bone fractures or breaks. The risk of osteoporosis can be identified using a bone density scan. Women ages 67 years and over and women at risk for fractures or osteoporosis should discuss screening with their health care providers. Ask your health care provider whether you should take a calcium supplement or vitamin D to reduce the rate of osteoporosis.  Menopause can be associated with physical symptoms and risks. Hormone replacement therapy is available to decrease symptoms and risks. You should talk to your health care provider about whether hormone replacement therapy is right for you.  Use sunscreen. Apply sunscreen liberally and repeatedly throughout the day. You should seek shade when your shadow is shorter than you. Protect yourself by wearing long sleeves, pants, a wide-brimmed hat, and sunglasses year round, whenever you are outdoors.  Once a month, do a whole body skin exam, using a mirror to look at the skin on your back. Tell your health care provider of new moles, moles that have irregular borders, moles that  are larger than a pencil eraser, or moles that have changed in shape or color.  Stay current with required vaccines (immunizations).  Influenza vaccine. All adults should be immunized every year.  Tetanus, diphtheria, and acellular pertussis (Td, Tdap) vaccine. Pregnant women should receive 1 dose of Tdap vaccine during each pregnancy. The dose should be obtained regardless of the length of time since the last dose. Immunization is preferred during the 27th-36th week of gestation. An adult who has not previously received Tdap or who does not know her vaccine status should receive 1 dose of Tdap. This initial dose should be followed by tetanus and diphtheria toxoids (Td) booster doses every 10 years. Adults with an unknown or incomplete history of completing a 3-dose immunization series with Td-containing vaccines should begin or complete a primary immunization series including a Tdap dose. Adults should receive a Td booster every 10 years.  Varicella vaccine. An adult without evidence of immunity to varicella should receive 2 doses or a second dose if she has previously received 1 dose. Pregnant females who do not have evidence of immunity should receive the first dose after pregnancy. This first dose should be obtained before leaving the health care facility. The second dose should be obtained 4-8 weeks after the first dose.  Human papillomavirus (HPV) vaccine. Females aged 13-26 years who have not received the vaccine previously should obtain the 3-dose series. The vaccine is not recommended for use in pregnant females. However, pregnancy testing is not needed before receiving a dose. If a female is found to be pregnant after receiving a dose, no treatment is needed. In that case, the remaining doses should be delayed until after the pregnancy. Immunization is recommended for any person with an immunocompromised condition through the age of 61 years if she did not get any or all doses earlier. During the  3-dose series, the second dose should be obtained 4-8 weeks after the first dose. The third dose should be obtained 24 weeks after the first dose and 16 weeks after the second dose.  Zoster vaccine. One dose is recommended for adults aged 30 years or older unless certain conditions are present.  Measles, mumps, and rubella (MMR) vaccine. Adults born  before 1957 generally are considered immune to measles and mumps. Adults born in 1957 or later should have 1 or more doses of MMR vaccine unless there is a contraindication to the vaccine or there is laboratory evidence of immunity to each of the three diseases. A routine second dose of MMR vaccine should be obtained at least 28 days after the first dose for students attending postsecondary schools, health care workers, or international travelers. People who received inactivated measles vaccine or an unknown type of measles vaccine during 1963-1967 should receive 2 doses of MMR vaccine. People who received inactivated mumps vaccine or an unknown type of mumps vaccine before 1979 and are at high risk for mumps infection should consider immunization with 2 doses of MMR vaccine. For females of childbearing age, rubella immunity should be determined. If there is no evidence of immunity, females who are not pregnant should be vaccinated. If there is no evidence of immunity, females who are pregnant should delay immunization until after pregnancy. Unvaccinated health care workers born before 1957 who lack laboratory evidence of measles, mumps, or rubella immunity or laboratory confirmation of disease should consider measles and mumps immunization with 2 doses of MMR vaccine or rubella immunization with 1 dose of MMR vaccine.  Pneumococcal 13-valent conjugate (PCV13) vaccine. When indicated, a person who is uncertain of his immunization history and has no record of immunization should receive the PCV13 vaccine. All adults 65 years of age and older should receive this  vaccine. An adult aged 19 years or older who has certain medical conditions and has not been previously immunized should receive 1 dose of PCV13 vaccine. This PCV13 should be followed with a dose of pneumococcal polysaccharide (PPSV23) vaccine. Adults who are at high risk for pneumococcal disease should obtain the PPSV23 vaccine at least 8 weeks after the dose of PCV13 vaccine. Adults older than 44 years of age who have normal immune system function should obtain the PPSV23 vaccine dose at least 1 year after the dose of PCV13 vaccine.  Pneumococcal polysaccharide (PPSV23) vaccine. When PCV13 is also indicated, PCV13 should be obtained first. All adults aged 65 years and older should be immunized. An adult younger than age 65 years who has certain medical conditions should be immunized. Any person who resides in a nursing home or long-term care facility should be immunized. An adult smoker should be immunized. People with an immunocompromised condition and certain other conditions should receive both PCV13 and PPSV23 vaccines. People with human immunodeficiency virus (HIV) infection should be immunized as soon as possible after diagnosis. Immunization during chemotherapy or radiation therapy should be avoided. Routine use of PPSV23 vaccine is not recommended for American Indians, [place] Natives, or people younger than 65 years unless there are medical conditions that require PPSV23 vaccine. When indicated, people who have unknown immunization and have no record of immunization should receive PPSV23 vaccine. One-time revaccination 5 years after the first dose of PPSV23 is recommended for people aged 19-64 years who have chronic kidney failure, nephrotic syndrome, asplenia, or immunocompromised conditions. People who received 1-2 doses of PPSV23 before age 65 years should receive another dose of PPSV23 vaccine at age 65 years or later if at least 5 years have passed since the previous dose. Doses of PPSV23 are not  needed for people immunized with PPSV23 at or after age 65 years.  Meningococcal vaccine. Adults with asplenia or persistent complement component deficiencies should receive 2 doses of quadrivalent meningococcal conjugate (MenACWY-D) vaccine. The doses should be obtained   at least 2 months apart. Microbiologists working with certain meningococcal bacteria, [place] recruits, people at risk during an outbreak, and people who travel to or live in countries with a high rate of meningitis should be immunized. A first-year college student up through age 34 years who is living in a residence hall should receive a dose if she did not receive a dose on or after her 16th birthday. Adults who have certain high-risk conditions should receive one or more doses of vaccine.  Hepatitis A vaccine. Adults who wish to be protected from this disease, have certain high-risk conditions, work with hepatitis A-infected animals, work in hepatitis A research labs, or travel to or work in countries with a high rate of hepatitis A should be immunized. Adults who were previously unvaccinated and who anticipate close contact with an international adoptee during the first 60 days after arrival in the [place] from a country with a high rate of hepatitis A should be immunized.  Hepatitis B vaccine. Adults who wish to be protected from this disease, have certain high-risk conditions, may be exposed to blood or other infectious body fluids, are household contacts or sex partners of hepatitis B positive people, are clients or workers in certain care facilities, or travel to or work in countries with a high rate of hepatitis B should be immunized.  Haemophilus influenzae type b (Hib) vaccine. A previously unvaccinated person with asplenia or sickle cell disease or having a scheduled splenectomy should receive 1 dose of Hib vaccine. Regardless of previous immunization, a recipient of a hematopoietic stem cell transplant should receive a  3-dose series 6-12 months after her successful transplant. Hib vaccine is not recommended for adults with HIV infection. Preventive Services / Frequency Ages 35 to 4 years  Blood pressure check.** / Every 3-5 years.  Lipid and cholesterol check.** / Every 5 years beginning at age 60.  Clinical breast exam.** / Every 3 years for women in their 71s and 10s.  BRCA-related cancer risk assessment.** / For women who have family members with a BRCA-related cancer (breast, ovarian, tubal, or peritoneal cancers).  Pap test.** / Every 2 years from ages 76 through 26. Every 3 years starting at age 61 through age 76 or 93 with a history of 3 consecutive normal Pap tests.  HPV screening.** / Every 3 years from ages 37 through ages 60 to 51 with a history of 3 consecutive normal Pap tests.  Hepatitis C blood test.** / For any individual with known risks for hepatitis C.  Skin self-exam. / Monthly.  Influenza vaccine. / Every year.  Tetanus, diphtheria, and acellular pertussis (Tdap, Td) vaccine.** / Consult your health care provider. Pregnant women should receive 1 dose of Tdap vaccine during each pregnancy. 1 dose of Td every 10 years.  Varicella vaccine.** / Consult your health care provider. Pregnant females who do not have evidence of immunity should receive the first dose after pregnancy.  HPV vaccine. / 3 doses over 6 months, if 93 and younger. The vaccine is not recommended for use in pregnant females. However, pregnancy testing is not needed before receiving a dose.  Measles, mumps, rubella (MMR) vaccine.** / You need at least 1 dose of MMR if you were born in 1957 or later. You may also need a 2nd dose. For females of childbearing age, rubella immunity should be determined. If there is no evidence of immunity, females who are not pregnant should be vaccinated. If there is no evidence of immunity, females who are  pregnant should delay immunization until after pregnancy.  Pneumococcal  13-valent conjugate (PCV13) vaccine.** / Consult your health care provider.  Pneumococcal polysaccharide (PPSV23) vaccine.** / 1 to 2 doses if you smoke cigarettes or if you have certain conditions.  Meningococcal vaccine.** / 1 dose if you are age 68 to 8 years and a Market researcher living in a residence hall, or have one of several medical conditions, you need to get vaccinated against meningococcal disease. You may also need additional booster doses.  Hepatitis A vaccine.** / Consult your health care provider.  Hepatitis B vaccine.** / Consult your health care provider.  Haemophilus influenzae type b (Hib) vaccine.** / Consult your health care provider. Ages 7 to 53 years  Blood pressure check.** / Every year.  Lipid and cholesterol check.** / Every 5 years beginning at age 25 years.  Lung cancer screening. / Every year if you are aged 11-80 years and have a 30-pack-year history of smoking and currently smoke or have quit within the past 15 years. Yearly screening is stopped once you have quit smoking for at least 15 years or develop a health problem that would prevent you from having lung cancer treatment.  Clinical breast exam.** / Every year after age 48 years.  BRCA-related cancer risk assessment.** / For women who have family members with a BRCA-related cancer (breast, ovarian, tubal, or peritoneal cancers).  Mammogram.** / Every year beginning at age 41 years and continuing for as long as you are in good health. Consult with your health care provider.  Pap test.** / Every 3 years starting at age 65 years through age 37 or 70 years with a history of 3 consecutive normal Pap tests.  HPV screening.** / Every 3 years from ages 72 years through ages 60 to 40 years with a history of 3 consecutive normal Pap tests.  Fecal occult blood test (FOBT) of stool. / Every year beginning at age 21 years and continuing until age 5 years. You may not need to do this test if you get  a colonoscopy every 10 years.  Flexible sigmoidoscopy or colonoscopy.** / Every 5 years for a flexible sigmoidoscopy or every 10 years for a colonoscopy beginning at age 35 years and continuing until age 48 years.  Hepatitis C blood test.** / For all people born from [birth year] through 1965 and any individual with known risks for hepatitis C.  Skin self-exam. / Monthly.  Influenza vaccine. / Every year.  Tetanus, diphtheria, and acellular pertussis (Tdap/Td) vaccine.** / Consult your health care provider. Pregnant women should receive 1 dose of Tdap vaccine during each pregnancy. 1 dose of Td every 10 years.  Varicella vaccine.** / Consult your health care provider. Pregnant females who do not have evidence of immunity should receive the first dose after pregnancy.  Zoster vaccine.** / 1 dose for adults aged 30 years or older.  Measles, mumps, rubella (MMR) vaccine.** / You need at least 1 dose of MMR if you were born in 1957 or later. You may also need a second dose. For females of childbearing age, rubella immunity should be determined. If there is no evidence of immunity, females who are not pregnant should be vaccinated. If there is no evidence of immunity, females who are pregnant should delay immunization until after pregnancy.  Pneumococcal 13-valent conjugate (PCV13) vaccine.** / Consult your health care provider.  Pneumococcal polysaccharide (PPSV23) vaccine.** / 1 to 2 doses if you smoke cigarettes or if you have certain conditions.  Meningococcal vaccine.** /  Consult your health care provider.  Hepatitis A vaccine.** / Consult your health care provider.  Hepatitis B vaccine.** / Consult your health care provider.  Haemophilus influenzae type b (Hib) vaccine.** / Consult your health care provider. Ages 64 years and over  Blood pressure check.** / Every year.  Lipid and cholesterol check.** / Every 5 years beginning at age 23 years.  Lung cancer screening. / Every year if you  are aged 16-80 years and have a 30-pack-year history of smoking and currently smoke or have quit within the past 15 years. Yearly screening is stopped once you have quit smoking for at least 15 years or develop a health problem that would prevent you from having lung cancer treatment.  Clinical breast exam.** / Every year after age 74 years.  BRCA-related cancer risk assessment.** / For women who have family members with a BRCA-related cancer (breast, ovarian, tubal, or peritoneal cancers).  Mammogram.** / Every year beginning at age 44 years and continuing for as long as you are in good health. Consult with your health care provider.  Pap test.** / Every 3 years starting at age 58 years through age 22 or 39 years with 3 consecutive normal Pap tests. Testing can be stopped between 65 and 70 years with 3 consecutive normal Pap tests and no abnormal Pap or HPV tests in the past 10 years.  HPV screening.** / Every 3 years from ages 64 years through ages 70 or 61 years with a history of 3 consecutive normal Pap tests. Testing can be stopped between 65 and 70 years with 3 consecutive normal Pap tests and no abnormal Pap or HPV tests in the past 10 years.  Fecal occult blood test (FOBT) of stool. / Every year beginning at age 40 years and continuing until age 27 years. You may not need to do this test if you get a colonoscopy every 10 years.  Flexible sigmoidoscopy or colonoscopy.** / Every 5 years for a flexible sigmoidoscopy or every 10 years for a colonoscopy beginning at age 7 years and continuing until age 32 years.  Hepatitis C blood test.** / For all people born from [birth year] through 1965 and any individual with known risks for hepatitis C.  Osteoporosis screening.** / A one-time screening for women ages 30 years and over and women at risk for fractures or osteoporosis.  Skin self-exam. / Monthly.  Influenza vaccine. / Every year.  Tetanus, diphtheria, and acellular pertussis (Tdap/Td)  vaccine.** / 1 dose of Td every 10 years.  Varicella vaccine.** / Consult your health care provider.  Zoster vaccine.** / 1 dose for adults aged 35 years or older.  Pneumococcal 13-valent conjugate (PCV13) vaccine.** / Consult your health care provider.  Pneumococcal polysaccharide (PPSV23) vaccine.** / 1 dose for all adults aged 46 years and older.  Meningococcal vaccine.** / Consult your health care provider.  Hepatitis A vaccine.** / Consult your health care provider.  Hepatitis B vaccine.** / Consult your health care provider.  Haemophilus influenzae type b (Hib) vaccine.** / Consult your health care provider. ** Family history and personal history of risk and conditions may change your health care provider's recommendations.   This information is not intended to replace advice given to you by your health care provider. Make sure you discuss any questions you have with your health care provider.   Document Released: 11/03/2001 Document Revised: 09/28/2014 Document Reviewed: 02/02/2011 Elsevier Interactive Patient Education [employer].

## 2015-07-01 NOTE — Assessment & Plan Note (Signed)
hgba1c acceptable, minimize simple carbs. Increase exercise as tolerated. Continue current meds 

## 2015-07-01 NOTE — Progress Notes (Signed)
Pre visit review using our clinic review tool, if applicable. No additional management support is needed unless otherwise documented below in the visit note. 

## 2015-07-01 NOTE — Assessment & Plan Note (Addendum)
Worsening lately, mind will not quite, fight or flight with palpitations more routinely. Patient interested in counseling but will hold off on meds for

## 2015-07-01 NOTE — Assessment & Plan Note (Addendum)
Patient encouraged to maintain heart healthy diet, regular exercise, adequate sleep. Consider daily probiotics. Take medications as prescribed. Pap normal in 2014 repeat in 2017. Given and reviewed copy of ACP documents from [employer] and encouraged to complete and return. Labs

## 2015-07-01 NOTE — Assessment & Plan Note (Signed)
Encouraged heart healthy diet, increase exercise, avoid trans fats, consider a krill oil cap daily 

## 2015-07-01 NOTE — Assessment & Plan Note (Signed)
Repeat cbc today 

## 2015-07-01 NOTE — Assessment & Plan Note (Signed)
Halo lesion on back present since college Raised verrucous lesion right ankle for several years, becomes irritated and bleeds at times will refer to dermatology for further consideration

## 2015-07-01 NOTE — Progress Notes (Signed)
Subjective:    [name], female    DOB: 1971/06/11, 44 y.o.   MRN: [id number]  Chief Complaint  Patient presents with  . Annual Exam    HPI Patient is in today for annual exam. Overall doing well but acknowledges increasing anxiety and irritability. She has had very infrequent hot flashes off and on at night for a couple of years but does note those have increased in frequency lately. No change in her menstrual cycle regularity or strength so far. She does believe though that hormones are playing a role in her irritability. She denies any significant new stressors. She is concerned about of raised lesion on her right ankle which is been present for several years but is becoming increasingly irritated. No other recent illness or flares in her breathing or headaches. Denies polyuria or polydipsia but does know to family members have recently been diagnosed with diabetes mellitus type 2 Denies CP/palp/SOB/HA/congestion/fevers/GI or GU c/o. Taking meds as prescribed  Past Medical History  Diagnosis Date  . Chicken pox as a child  . Mumps as a child  . Asthma     cats, dust, triggers  . Hyperglycemia 02/29/2012  . Gestational thrombocytopenia ([place]) 02/29/2012  . Preventative health care 03/03/2012  . Atypical moles 03/03/2012  . Thrombocytopenia ([place]) 02/29/2012    Possible anemia/low iron? In past per patient   . Breast lump on right side at 10 o'clock position 03/21/2012  . UTI (lower urinary tract infection) 04/14/2012  . Asthma exacerbation   . Allergic state 08/30/2012  . Asthma   . Migraine 06/26/2013  . Migraine 07/01/2013  . Hyperlipidemia 07/01/2013  . Anxiety state 07/01/2014  . Neck pain 07/01/2014  . Skin lesion 07/01/2015    Halo lesion on back present since college Raised verrucous lesion right ankle for several years.    Past Surgical History  Procedure Laterality Date  . Cesarean section  1999, [id number]    X 3  . Hammer toe surgery  1992    right 5th  toe  . Tubal ligation      Family History  Problem Relation Age of Onset  . Thyroid disease Father   . Depression Sister     related to cycle  . Muscular dystrophy Son     Donnajean Lopes  . Other Son     Neurogenic bladder  . Asthma Son     mild  . Celiac disease Son   . Stroke Maternal Grandmother   . Heart attack Maternal Grandfather     ?  Marland Kitchen Heart disease Maternal Grandfather   . Diabetes Paternal Grandmother     type 2  . Hypertension Paternal Grandmother   . Obesity Paternal Grandmother   . Other Sister     gluten intolerate or celiac, rectocele repair x 2 with bowel adhesions.  . Stroke Sister 10    s/p infections and surgeries  . Depression Sister   . Fibromyalgia Sister   . Thyroid disease Sister   . Cholelithiasis Sister   . Celiac disease Son   . Kidney disease Maternal Uncle   . Diabetes Maternal Uncle   . Diabetes Mother     2, diet controlled    Social History   Social History  . Marital Status: Married    Spouse Name: N/A  . Number of Children: N/A  . Years of Education: N/A   Occupational History  . Not on file.   Social History Main Topics  .  Smoking status: Never Smoker   . Smokeless tobacco: Never Used  . Alcohol Use: No  . Drug Use: No  . Sexual Activity:    Partners: Male   Other Topics Concern  . Not on file   Social History Narrative    Outpatient Prescriptions Prior to Visit  Medication Sig Dispense Refill  . albuterol (PROVENTIL HFA;VENTOLIN HFA) 108 (90 BASE) MCG/ACT inhaler Inhale 2 puffs into the lungs every 6 (six) hours as needed for wheezing. 1 Inhaler 1   No facility-administered medications prior to visit.    No Known Allergies  Review of Systems  Constitutional: Positive for malaise/fatigue. Negative for fever and chills.  HENT: Negative for congestion and hearing loss.   Eyes: Negative for discharge.  Respiratory: Negative for cough, sputum production and shortness of breath.   Cardiovascular: Negative for  chest pain, palpitations and leg swelling.  Gastrointestinal: Negative for heartburn, nausea, vomiting, abdominal pain, diarrhea, constipation and blood in stool.  Genitourinary: Negative for dysuria, urgency, frequency and hematuria.  Musculoskeletal: Negative for myalgias, back pain and falls.  Skin: Negative for rash.  Neurological: Negative for dizziness, sensory change, loss of consciousness, weakness and headaches.  Endo/Heme/Allergies: Negative for environmental allergies. Does not bruise/bleed easily.  Psychiatric/Behavioral: Negative for depression and suicidal ideas. The patient is nervous/anxious. The patient does not have insomnia.        Objective:    Physical Exam  Constitutional: She is oriented to person, place, and time. She appears well-developed and well-nourished. No distress.  HENT:  Head: Normocephalic and atraumatic.  Eyes: Conjunctivae are normal.  Neck: Neck supple. No thyromegaly present.  Cardiovascular: Normal rate, regular rhythm and normal heart sounds.   No murmur heard. Pulmonary/Chest: Effort normal and breath sounds normal. No respiratory distress.  Abdominal: Soft. Bowel sounds are normal. She exhibits no distension and no mass. There is no tenderness.  Musculoskeletal: She exhibits no edema.  Lymphadenopathy:    She has no cervical adenopathy.  Neurological: She is alert and oriented to person, place, and time.  Skin: Skin is warm and dry.  Pale circular lesion 1-2 cm on back and raised verrucous lesion on lateral right ankle with scab in middle, 1 cm  Psychiatric: She has a normal mood and affect. Her behavior is normal.    BP 94/70 mmHg  Pulse 62  Temp(Src) 98.4 F (36.9 C) (Oral)  Ht 5\' 3"  (1.6 m)  Wt 120 lb 4 oz (54.545 kg)  BMI 21.31 kg/m2  SpO2 97% Wt Readings from Last 3 Encounters:  07/01/15 120 lb 4 oz (54.545 kg)  06/28/14 122 lb 6.4 oz (55.52 kg)  06/26/13 116 lb 1.9 oz (52.672 kg)     Lab Results  Component Value Date    WBC 5.9 06/28/2014   HGB 13.9 06/28/2014   HCT 40.9 06/28/2014   PLT 162.0 06/28/2014   GLUCOSE 78 06/28/2014   CHOL 150 06/28/2014   TRIG 119.0 06/28/2014   HDL 48.50 06/28/2014   LDLCALC 78 06/28/2014   ALT 20 06/28/2014   AST 25 06/28/2014   NA 139 06/28/2014   K 3.8 06/28/2014   CL 103 06/28/2014   CREATININE 0.7 06/28/2014   BUN 16 06/28/2014   CO2 28 06/28/2014   TSH 1.60 06/28/2014   HGBA1C 4.8 02/29/2012    Lab Results  Component Value Date   TSH 1.60 06/28/2014   Lab Results  Component Value Date   WBC 5.9 06/28/2014   HGB 13.9 06/28/2014   HCT  40.9 06/28/2014   MCV 92.8 06/28/2014   PLT 162.0 06/28/2014   Lab Results  Component Value Date   NA 139 06/28/2014   K 3.8 06/28/2014   CO2 28 06/28/2014   GLUCOSE 78 06/28/2014   BUN 16 06/28/2014   CREATININE 0.7 06/28/2014   BILITOT 0.7 06/28/2014   ALKPHOS 52 06/28/2014   AST 25 06/28/2014   ALT 20 06/28/2014   PROT 7.4 06/28/2014   ALBUMIN 3.8 06/28/2014   ALBUMIN 3.8 06/28/2014   CALCIUM 9.3 06/28/2014   GFR 90.83 06/28/2014   Lab Results  Component Value Date   CHOL 150 06/28/2014   Lab Results  Component Value Date   HDL 48.50 06/28/2014   Lab Results  Component Value Date   LDLCALC 78 06/28/2014   Lab Results  Component Value Date   TRIG 119.0 06/28/2014   Lab Results  Component Value Date   CHOLHDL 3 06/28/2014   Lab Results  Component Value Date   HGBA1C 4.8 02/29/2012       Assessment & Plan:   Problem List Items Addressed This Visit    Thrombocytopenia ([place])    Repeat cbc today      Skin lesion    Halo lesion on back present since college Raised verrucous lesion right ankle for several years, becomes irritated and bleeds at times will refer to dermatology for further consideration      Relevant Orders   Ambulatory referral to Dermatology   Preventative health care    Patient encouraged to maintain heart healthy diet, regular exercise, adequate sleep. Consider  daily probiotics. Take medications as prescribed. Pap normal in 2014 repeat in 2017. Given and reviewed copy of ACP documents from [employer] and encouraged to complete and return. Labs       Relevant Orders   TSH   CBC   Comprehensive metabolic panel   Lipid panel   Hyperlipemia - Primary    Encouraged heart healthy diet, increase exercise, avoid trans fats, consider a krill oil cap daily      Relevant Orders   TSH   CBC   Comprehensive metabolic panel   Lipid panel   Hyperglycemia    hgba1c acceptable, minimize simple carbs. Increase exercise as tolerated. Continue current meds      Relevant Orders   TSH   CBC   Comprehensive metabolic panel   Lipid panel   Anxiety state    Worsening lately, mind will not quite, fight or flight with palpitations more routinely. Patient interested in counseling but will hold off on meds for      Relevant Orders   TSH   CBC   Comprehensive metabolic panel   Lipid panel   Ambulatory referral to Psychology    Other Visit Diagnoses    Breast cancer screening        Relevant Orders    MM DIGITAL SCREENING BILATERAL       I am having Ms. Gear maintain her albuterol.  No orders of the defined types were placed in this encounter.     Penni Homans, MD

## 2015-07-29 ENCOUNTER — Ambulatory Visit (INDEPENDENT_AMBULATORY_CARE_PROVIDER_SITE_OTHER): Payer: BLUE CROSS/BLUE SHIELD | Admitting: Psychology

## 2015-07-29 DIAGNOSIS — F4323 Adjustment disorder with mixed anxiety and depressed mood: Secondary | ICD-10-CM | POA: Diagnosis not present

## 2015-08-12 ENCOUNTER — Ambulatory Visit (INDEPENDENT_AMBULATORY_CARE_PROVIDER_SITE_OTHER): Payer: BLUE CROSS/BLUE SHIELD | Admitting: Psychology

## 2015-08-12 DIAGNOSIS — F4323 Adjustment disorder with mixed anxiety and depressed mood: Secondary | ICD-10-CM

## 2015-08-26 ENCOUNTER — Ambulatory Visit: Payer: BLUE CROSS/BLUE SHIELD | Admitting: Psychology

## 2015-09-11 ENCOUNTER — Ambulatory Visit (INDEPENDENT_AMBULATORY_CARE_PROVIDER_SITE_OTHER): Payer: BLUE CROSS/BLUE SHIELD | Admitting: Psychology

## 2015-09-11 DIAGNOSIS — F4323 Adjustment disorder with mixed anxiety and depressed mood: Secondary | ICD-10-CM

## 2015-10-01 ENCOUNTER — Other Ambulatory Visit: Payer: BLUE CROSS/BLUE SHIELD

## 2015-10-02 ENCOUNTER — Other Ambulatory Visit (INDEPENDENT_AMBULATORY_CARE_PROVIDER_SITE_OTHER): Payer: 59

## 2015-10-02 ENCOUNTER — Ambulatory Visit (INDEPENDENT_AMBULATORY_CARE_PROVIDER_SITE_OTHER): Payer: 59 | Admitting: Psychology

## 2015-10-02 DIAGNOSIS — D696 Thrombocytopenia, unspecified: Secondary | ICD-10-CM

## 2015-10-02 DIAGNOSIS — F4323 Adjustment disorder with mixed anxiety and depressed mood: Secondary | ICD-10-CM

## 2015-10-02 LAB — CBC WITH DIFFERENTIAL/PLATELET
BASOS PCT: 0.3 % (ref 0.0–3.0)
Basophils Absolute: 0 10*3/uL (ref 0.0–0.1)
EOS ABS: 0.4 10*3/uL (ref 0.0–0.7)
Eosinophils Relative: 8.4 % — ABNORMAL HIGH (ref 0.0–5.0)
HEMATOCRIT: 40 % (ref 36.0–46.0)
Hemoglobin: 13.3 g/dL (ref 12.0–15.0)
Lymphocytes Relative: 29.7 % (ref 12.0–46.0)
Lymphs Abs: 1.5 10*3/uL (ref 0.7–4.0)
MCHC: 33.4 g/dL (ref 30.0–36.0)
MCV: 92.8 fl (ref 78.0–100.0)
Monocytes Absolute: 0.5 10*3/uL (ref 0.1–1.0)
Monocytes Relative: 8.8 % (ref 3.0–12.0)
NEUTROS ABS: 2.7 10*3/uL (ref 1.4–7.7)
NEUTROS PCT: 52.8 % (ref 43.0–77.0)
PLATELETS: 140 10*3/uL — AB (ref 150.0–400.0)
RBC: 4.3 Mil/uL (ref 3.87–5.11)
RDW: 12.4 % (ref 11.5–15.5)
WBC: 5.2 10*3/uL (ref 4.0–10.5)

## 2015-10-30 ENCOUNTER — Ambulatory Visit (INDEPENDENT_AMBULATORY_CARE_PROVIDER_SITE_OTHER): Payer: 59 | Admitting: Psychology

## 2015-10-30 DIAGNOSIS — F4323 Adjustment disorder with mixed anxiety and depressed mood: Secondary | ICD-10-CM | POA: Diagnosis not present

## 2015-11-13 ENCOUNTER — Ambulatory Visit (INDEPENDENT_AMBULATORY_CARE_PROVIDER_SITE_OTHER): Payer: 59 | Admitting: Psychology

## 2015-11-13 DIAGNOSIS — F4323 Adjustment disorder with mixed anxiety and depressed mood: Secondary | ICD-10-CM

## 2015-11-27 ENCOUNTER — Ambulatory Visit (INDEPENDENT_AMBULATORY_CARE_PROVIDER_SITE_OTHER): Payer: 59 | Admitting: Psychology

## 2015-11-27 DIAGNOSIS — F4323 Adjustment disorder with mixed anxiety and depressed mood: Secondary | ICD-10-CM

## 2015-12-16 ENCOUNTER — Ambulatory Visit (INDEPENDENT_AMBULATORY_CARE_PROVIDER_SITE_OTHER): Payer: 59 | Admitting: Psychology

## 2015-12-16 DIAGNOSIS — F4323 Adjustment disorder with mixed anxiety and depressed mood: Secondary | ICD-10-CM | POA: Diagnosis not present

## 2015-12-30 ENCOUNTER — Ambulatory Visit (INDEPENDENT_AMBULATORY_CARE_PROVIDER_SITE_OTHER): Payer: 59 | Admitting: Psychology

## 2015-12-30 DIAGNOSIS — F4323 Adjustment disorder with mixed anxiety and depressed mood: Secondary | ICD-10-CM | POA: Diagnosis not present

## 2016-01-22 ENCOUNTER — Ambulatory Visit (INDEPENDENT_AMBULATORY_CARE_PROVIDER_SITE_OTHER): Payer: 59 | Admitting: Psychology

## 2016-01-22 DIAGNOSIS — F4323 Adjustment disorder with mixed anxiety and depressed mood: Secondary | ICD-10-CM

## 2016-02-12 ENCOUNTER — Ambulatory Visit (INDEPENDENT_AMBULATORY_CARE_PROVIDER_SITE_OTHER): Payer: 59 | Admitting: Psychology

## 2016-02-12 DIAGNOSIS — F4323 Adjustment disorder with mixed anxiety and depressed mood: Secondary | ICD-10-CM

## 2016-03-06 ENCOUNTER — Ambulatory Visit (INDEPENDENT_AMBULATORY_CARE_PROVIDER_SITE_OTHER): Payer: 59 | Admitting: Psychology

## 2016-03-06 DIAGNOSIS — F4323 Adjustment disorder with mixed anxiety and depressed mood: Secondary | ICD-10-CM | POA: Diagnosis not present

## 2016-03-18 ENCOUNTER — Ambulatory Visit: Payer: 59 | Admitting: Psychology

## 2016-03-25 ENCOUNTER — Ambulatory Visit: Payer: 59 | Admitting: Psychology

## 2016-04-10 ENCOUNTER — Ambulatory Visit (INDEPENDENT_AMBULATORY_CARE_PROVIDER_SITE_OTHER): Payer: 59 | Admitting: Psychology

## 2016-04-10 DIAGNOSIS — F4323 Adjustment disorder with mixed anxiety and depressed mood: Secondary | ICD-10-CM | POA: Diagnosis not present

## 2016-05-13 ENCOUNTER — Ambulatory Visit (INDEPENDENT_AMBULATORY_CARE_PROVIDER_SITE_OTHER): Payer: 59 | Admitting: Psychology

## 2016-05-13 DIAGNOSIS — F4323 Adjustment disorder with mixed anxiety and depressed mood: Secondary | ICD-10-CM | POA: Diagnosis not present

## 2016-06-03 ENCOUNTER — Ambulatory Visit (INDEPENDENT_AMBULATORY_CARE_PROVIDER_SITE_OTHER): Payer: 59 | Admitting: Psychology

## 2016-06-03 DIAGNOSIS — F4323 Adjustment disorder with mixed anxiety and depressed mood: Secondary | ICD-10-CM | POA: Diagnosis not present

## 2016-06-24 ENCOUNTER — Ambulatory Visit (INDEPENDENT_AMBULATORY_CARE_PROVIDER_SITE_OTHER): Payer: 59 | Admitting: Psychology

## 2016-06-24 DIAGNOSIS — F4323 Adjustment disorder with mixed anxiety and depressed mood: Secondary | ICD-10-CM

## 2016-07-02 ENCOUNTER — Encounter: Payer: BLUE CROSS/BLUE SHIELD | Admitting: Family Medicine

## 2016-07-15 ENCOUNTER — Ambulatory Visit (INDEPENDENT_AMBULATORY_CARE_PROVIDER_SITE_OTHER): Payer: 59 | Admitting: Psychology

## 2016-07-15 DIAGNOSIS — F4323 Adjustment disorder with mixed anxiety and depressed mood: Secondary | ICD-10-CM

## 2016-07-28 ENCOUNTER — Encounter: Payer: Self-pay | Admitting: Family Medicine

## 2016-07-28 ENCOUNTER — Other Ambulatory Visit (HOSPITAL_COMMUNITY)
Admission: RE | Admit: 2016-07-28 | Discharge: 2016-07-28 | Disposition: A | Discharge status: Home / Self Care | Payer: 59 | Source: Ambulatory Visit | Attending: Family Medicine | Admitting: Family Medicine

## 2016-07-28 ENCOUNTER — Ambulatory Visit (INDEPENDENT_AMBULATORY_CARE_PROVIDER_SITE_OTHER): Payer: 59 | Admitting: Family Medicine

## 2016-07-28 VITALS — BP 96/72 | HR 61 | Temp 98.1°F | Ht 63.0 in | Wt 120.6 lb

## 2016-07-28 DIAGNOSIS — Z0000 Encounter for general adult medical examination without abnormal findings: Secondary | ICD-10-CM | POA: Diagnosis not present

## 2016-07-28 DIAGNOSIS — Z1151 Encounter for screening for human papillomavirus (HPV): Secondary | ICD-10-CM | POA: Diagnosis present

## 2016-07-28 DIAGNOSIS — Z124 Encounter for screening for malignant neoplasm of cervix: Secondary | ICD-10-CM | POA: Diagnosis not present

## 2016-07-28 DIAGNOSIS — Z87440 Personal history of urinary (tract) infections: Secondary | ICD-10-CM | POA: Diagnosis not present

## 2016-07-28 DIAGNOSIS — Z23 Encounter for immunization: Secondary | ICD-10-CM | POA: Diagnosis not present

## 2016-07-28 DIAGNOSIS — Z113 Encounter for screening for infections with a predominantly sexual mode of transmission: Secondary | ICD-10-CM | POA: Insufficient documentation

## 2016-07-28 DIAGNOSIS — G43809 Other migraine, not intractable, without status migrainosus: Secondary | ICD-10-CM

## 2016-07-28 DIAGNOSIS — R5383 Other fatigue: Secondary | ICD-10-CM

## 2016-07-28 DIAGNOSIS — R739 Hyperglycemia, unspecified: Secondary | ICD-10-CM

## 2016-07-28 DIAGNOSIS — Z01419 Encounter for gynecological examination (general) (routine) without abnormal findings: Secondary | ICD-10-CM | POA: Insufficient documentation

## 2016-07-28 DIAGNOSIS — F411 Generalized anxiety disorder: Secondary | ICD-10-CM

## 2016-07-28 DIAGNOSIS — D696 Thrombocytopenia, unspecified: Secondary | ICD-10-CM

## 2016-07-28 DIAGNOSIS — J45909 Unspecified asthma, uncomplicated: Secondary | ICD-10-CM

## 2016-07-28 DIAGNOSIS — E782 Mixed hyperlipidemia: Secondary | ICD-10-CM | POA: Diagnosis not present

## 2016-07-28 HISTORY — DX: Personal history of urinary (tract) infections: Z87.440

## 2016-07-28 LAB — URINALYSIS
Bilirubin Urine: NEGATIVE
Ketones, ur: NEGATIVE
LEUKOCYTES UA: NEGATIVE
NITRITE: NEGATIVE
Specific Gravity, Urine: 1.025 (ref 1.000–1.030)
Total Protein, Urine: NEGATIVE
URINE GLUCOSE: NEGATIVE
UROBILINOGEN UA: 0.2 (ref 0.0–1.0)
pH: 6 (ref 5.0–8.0)

## 2016-07-28 LAB — COMPREHENSIVE METABOLIC PANEL
ALT: 11 U/L (ref 0–35)
AST: 16 U/L (ref 0–37)
Albumin: 4.3 g/dL (ref 3.5–5.2)
Alkaline Phosphatase: 51 U/L (ref 39–117)
BUN: 14 mg/dL (ref 6–23)
CHLORIDE: 104 meq/L (ref 96–112)
CO2: 29 meq/L (ref 19–32)
Calcium: 9.5 mg/dL (ref 8.4–10.5)
Creatinine, Ser: 0.81 mg/dL (ref 0.40–1.20)
GFR: 81.06 mL/min (ref >60.00)
GLUCOSE: 85 mg/dL (ref 70–99)
POTASSIUM: 3.7 meq/L (ref 3.5–5.1)
SODIUM: 138 meq/L (ref 135–145)
Total Bilirubin: 0.7 mg/dL (ref 0.2–1.2)
Total Protein: 6.8 g/dL (ref 6.0–8.3)

## 2016-07-28 LAB — CBC
HEMATOCRIT: 40.8 % (ref 36.0–46.0)
HEMOGLOBIN: 13.6 g/dL (ref 12.0–15.0)
MCHC: 33.5 g/dL (ref 30.0–36.0)
MCV: 91 fl (ref 78.0–100.0)
Platelets: 159 10*3/uL (ref 150.0–400.0)
RBC: 4.48 Mil/uL (ref 3.87–5.11)
RDW: 12.6 % (ref 11.5–15.5)
WBC: 5.2 10*3/uL (ref 4.0–10.5)

## 2016-07-28 LAB — LIPID PANEL
CHOL/HDL RATIO: 3
Cholesterol: 140 mg/dL (ref 0–200)
HDL: 52.9 mg/dL (ref >39.00)
LDL Cholesterol: 73 mg/dL (ref 0–99)
NONHDL: 87.21
Triglycerides: 71 mg/dL (ref 0.0–149.0)
VLDL: 14.2 mg/dL (ref 0.0–40.0)

## 2016-07-28 LAB — T4, FREE: Free T4: 0.86 ng/dL (ref 0.60–1.60)

## 2016-07-28 LAB — HEMOGLOBIN A1C: Hgb A1c MFr Bld: 4.9 % (ref 4.6–6.5)

## 2016-07-28 LAB — SEDIMENTATION RATE: Sed Rate: 1 mm/hr (ref 0–20)

## 2016-07-28 LAB — TSH: TSH: 1.68 u[IU]/mL (ref 0.35–4.50)

## 2016-07-28 MED ORDER — ALBUTEROL SULFATE HFA 108 (90 BASE) MCG/ACT IN AERS: 2.0000 | INHALATION_SPRAY | Freq: Four times a day (QID) | RESPIRATORY_TRACT | 1 refills | Status: DC | PRN

## 2016-07-28 MED ORDER — NITROFURANTOIN MACROCRYSTAL 50 MG PO CAPS: 50.0000 mg | ORAL_CAPSULE | Freq: Every day | ORAL | 1 refills | Status: DC | PRN

## 2016-07-28 NOTE — Assessment & Plan Note (Signed)
Pap today, no concerns on exam.  

## 2016-07-28 NOTE — Assessment & Plan Note (Signed)
Encouraged heart healthy diet, increase exercise, avoid trans fats, consider a krill oil cap daily 

## 2016-07-28 NOTE — Assessment & Plan Note (Signed)
Both son's with Celiac Disease and a son with disabilities trying to get into college.

## 2016-07-28 NOTE — Assessment & Plan Note (Signed)
CBC today.  

## 2016-07-28 NOTE — Progress Notes (Signed)
Pre visit review using our clinic review tool, if applicable. No additional management support is needed unless otherwise documented below in the visit note. 

## 2016-07-28 NOTE — Progress Notes (Signed)
Subjective:    [name], female    DOB: 26-Jan-1971, 45 y.o.   MRN: [id number]  Chief Complaint  Patient presents with  . Annual Exam    with pap smear    HPI Patient is in today for an annual exam with a gyn pap smear. Patient c/o of frequent headaches, and feelings of fatigue at times. No recent illness or acute concerns. Is a busy mother of two sons with Celiac disease. Denies CP/palp/SOB/HA/congestion/fevers/GI or GU c/o. Taking meds as prescribed  Past Medical History:  Diagnosis Date  . Allergic state 08/30/2012  . Anxiety state 07/01/2014  . Asthma    cats, dust, triggers  . Asthma   . Asthma exacerbation   . Atypical moles 03/03/2012  . Breast lump on right side at 10 o'clock position 03/21/2012  . Chicken pox as a child  . Gestational thrombocytopenia ([place]) 02/29/2012  . History of recurrent UTIs 07/28/2016  . Hyperglycemia 02/29/2012  . Hyperlipidemia 07/01/2013  . Migraine 06/26/2013  . Migraine 07/01/2013  . Mumps as a child  . Neck pain 07/01/2014  . Preventative health care 03/03/2012  . Skin lesion 07/01/2015   Halo lesion on back present since college Raised verrucous lesion right ankle for several years.  . Thrombocytopenia ([place]) 02/29/2012   Possible anemia/low iron? In past per patient   . UTI (lower urinary tract infection) 04/14/2012    Past Surgical History:  Procedure Laterality Date  . [place], 2001,2003   X 3  . HAMMER TOE SURGERY  1992   right 5th toe  . TUBAL LIGATION      Family History  Problem Relation Age of Onset  . Thyroid disease Father     Hashimoto's  . Depression Sister     related to cycle  . Muscular dystrophy Son     Donnajean Lopes  . Other Son     Neurogenic bladder  . Asthma Son     mild  . Celiac disease Son   . Stroke Maternal Grandmother   . Heart attack Maternal Grandfather     ?  Marland Kitchen Heart disease Maternal Grandfather   . Diabetes Paternal Grandmother     type 2  . Hypertension  Paternal Grandmother   . Obesity Paternal Grandmother   . Other Sister     gluten intolerate or celiac, rectocele repair x 2 with bowel adhesions.  . Stroke Sister 66    s/p infections and surgeries  . Depression Sister   . Fibromyalgia Sister   . Thyroid disease Sister   . Cholelithiasis Sister   . Celiac disease Son   . Kidney disease Maternal Uncle   . Diabetes Maternal Uncle   . Diabetes Mother     2, diet controlled    Social History   Social History  . Marital status: Married    Spouse name: N/A  . Number of children: N/A  . Years of education: N/A   Occupational History  . Not on file.   Social History Main Topics  . Smoking status: Never Smoker  . Smokeless tobacco: Never Used  . Alcohol use No  . Drug use: No  . Sexual activity: Yes    Partners: Male   Other Topics Concern  . Not on file   Social History Narrative  . No narrative on file    Outpatient Medications Prior to Visit  Medication Sig Dispense Refill  . albuterol (PROVENTIL HFA;VENTOLIN HFA) 108 (90 BASE)  MCG/ACT inhaler Inhale 2 puffs into the lungs every 6 (six) hours as needed for wheezing. 1 Inhaler 1   No facility-administered medications prior to visit.     No Known Allergies  Review of Systems  Constitutional: Positive for malaise/fatigue. Negative for fever.  Eyes: Negative for blurred vision.  Respiratory: Negative for cough and shortness of breath.   Cardiovascular: Negative for chest pain and palpitations.  Gastrointestinal: Negative for vomiting.  Genitourinary: Positive for flank pain.  Musculoskeletal: Negative for back pain.  Skin: Negative for rash.  Neurological: Positive for headaches. Negative for loss of consciousness.       Objective:    Physical Exam  Constitutional: She is oriented to person, place, and time. She appears well-developed and well-nourished. No distress.  HENT:  Head: Normocephalic and atraumatic.  Eyes: Conjunctivae are normal.  Neck:  Normal range of motion. No thyromegaly present.  Cardiovascular: Normal rate and regular rhythm.   Pulmonary/Chest: Effort normal and breath sounds normal. She has no wheezes.  Abdominal: Soft. Bowel sounds are normal. There is no tenderness.  Genitourinary: Vagina normal and uterus normal.  Genitourinary Comments: No breast lesions, skin changes or discharge.  Musculoskeletal: Normal range of motion. She exhibits no edema or deformity.  Neurological: She is alert and oriented to person, place, and time.  Skin: Skin is warm and dry. She is not diaphoretic.  Psychiatric: She has a normal mood and affect.    BP 96/72 (BP Location: Left Arm, Patient Position: Sitting, Cuff Size: Normal)   Pulse 61   Temp 98.1 F (36.7 C) (Oral)   Ht 5' 3"  (1.6 m)   Wt 120 lb 9.6 oz (54.7 kg)   SpO2 98%   BMI 21.36 kg/m  Wt Readings from Last 3 Encounters:  07/28/16 120 lb 9.6 oz (54.7 kg)  07/01/15 120 lb 4 oz (54.5 kg)  06/28/14 122 lb 6.4 oz (55.5 kg)     Lab Results  Component Value Date   WBC 5.2 07/28/2016   HGB 13.6 07/28/2016   HCT 40.8 07/28/2016   PLT 159.0 07/28/2016   GLUCOSE 85 07/28/2016   CHOL 140 07/28/2016   TRIG 71.0 07/28/2016   HDL 52.90 07/28/2016   LDLCALC 73 07/28/2016   ALT 11 07/28/2016   AST 16 07/28/2016   NA 138 07/28/2016   K 3.7 07/28/2016   CL 104 07/28/2016   CREATININE 0.81 07/28/2016   BUN 14 07/28/2016   CO2 29 07/28/2016   TSH 1.68 07/28/2016   HGBA1C 4.9 07/28/2016    Lab Results  Component Value Date   TSH 1.68 07/28/2016   Lab Results  Component Value Date   WBC 5.2 07/28/2016   HGB 13.6 07/28/2016   HCT 40.8 07/28/2016   MCV 91.0 07/28/2016   PLT 159.0 07/28/2016   Lab Results  Component Value Date   NA 138 07/28/2016   K 3.7 07/28/2016   CO2 29 07/28/2016   GLUCOSE 85 07/28/2016   BUN 14 07/28/2016   CREATININE 0.81 07/28/2016   BILITOT 0.7 07/28/2016   ALKPHOS 51 07/28/2016   AST 16 07/28/2016   ALT 11 07/28/2016   PROT  6.8 07/28/2016   ALBUMIN 4.3 07/28/2016   CALCIUM 9.5 07/28/2016   GFR 81.06 07/28/2016   Lab Results  Component Value Date   CHOL 140 07/28/2016   Lab Results  Component Value Date   HDL 52.90 07/28/2016   Lab Results  Component Value Date   LDLCALC 73 07/28/2016  Lab Results  Component Value Date   TRIG 71.0 07/28/2016   Lab Results  Component Value Date   CHOLHDL 3 07/28/2016   Lab Results  Component Value Date   HGBA1C 4.9 07/28/2016       Assessment & Plan:   Problem List Items Addressed This Visit    Asthma   Relevant Medications   albuterol (PROVENTIL HFA;VENTOLIN HFA) 108 (90 Base) MCG/ACT inhaler   Hyperglycemia    minimize simple carbs. Increase exercise as tolerated. Check hgba1c today      Relevant Orders   HgB A1c (Completed)   Thrombocytopenia (HCC)    CBC today      Relevant Orders   TSH (Completed)   CBC (Completed)   Comp Met (CMET) (Completed)   Preventative health care    Patient encouraged to maintain heart healthy diet, regular exercise, adequate sleep. Consider daily probiotics. Take medications as prescribed. Given and reviewed copy of ACP documents from [employer] and encouraged to complete and return.      Relevant Orders   TSH (Completed)   CBC (Completed)   Sed Rate (ESR) (Completed)   Lipid panel (Completed)   Comp Met (CMET) (Completed)   HgB A1c (Completed)   T4, free (Completed)   Cervical cancer screening    Pap today, no concerns on exam.       Migraine    Encouraged increased hydration, 64 ounces of clear fluids daily. Minimize alcohol and caffeine. Eat small frequent meals with lean proteins and complex carbs. Avoid high and low blood sugars. Get adequate sleep, 7-8 hours a night. Needs exercise daily preferably in the morning. Better since adjusting glasses. Worse right before cycles. Uses Advil and Caffeine, caffeine does not help the hormonal migraines as much, consider Excedrin Tension        Relevant Orders   CBC (Completed)   Sed Rate (ESR) (Completed)   Comp Met (CMET) (Completed)   T4, free (Completed)   Hyperlipemia    Encouraged heart healthy diet, increase exercise, avoid trans fats, consider a krill oil cap daily      Relevant Orders   Lipid panel (Completed)   Anxiety state    Both son's with Celiac Disease and a son with disabilities trying to get into college.        Other Visit Diagnoses    Encounter for preventive health examination    -  Primary   Relevant Orders   Cytology - PAP (Completed)   Encounter for Papanicolaou smear for cervical cancer screening       Relevant Orders   Cytology - PAP (Completed)   Encounter for immunization       Relevant Medications   albuterol (PROVENTIL HFA;VENTOLIN HFA) 108 (90 Base) MCG/ACT inhaler   Other Relevant Orders   Cytology - PAP (Completed)   Flu Vaccine QUAD 36+ mos IM (Completed)   TSH (Completed)   CBC (Completed)   Sed Rate (ESR) (Completed)   Lipid panel (Completed)   Comp Met (CMET) (Completed)   HgB A1c (Completed)   T4, free (Completed)   Fatigue, unspecified type       Relevant Orders   T4, free (Completed)   History of recurrent UTI (urinary tract infection)       Relevant Orders   Urinalysis (Completed)   Urine Culture (Completed)      I am having Ms. Litton start on nitrofurantoin. I am also having her maintain her albuterol.  Meds ordered this encounter  Medications  . DISCONTD: albuterol (PROVENTIL HFA;VENTOLIN HFA) 108 (90 Base) MCG/ACT inhaler    Sig: Inhale 2 puffs into the lungs every 6 (six) hours as needed for wheezing.    Dispense:  1 Inhaler    Refill:  1  . albuterol (PROVENTIL HFA;VENTOLIN HFA) 108 (90 Base) MCG/ACT inhaler    Sig: Inhale 2 puffs into the lungs every 6 (six) hours as needed for wheezing.    Dispense:  1 Inhaler    Refill:  1  . nitrofurantoin (MACRODANTIN) 50 MG capsule    Sig: Take 1 capsule (50 mg total) by mouth daily as needed. Coitus     Dispense:  30 capsule    Refill:  1     Penni Homans, MD  [name], female   DOB: 14-Dec-1970, 45 y.o.   MRN: [id number]

## 2016-07-28 NOTE — Assessment & Plan Note (Signed)
Occurs intermittently after coitus, increase hydration, add cranberry tabs, Nitrofurantoin 50 mg prn, check UA and c and s

## 2016-07-28 NOTE — Assessment & Plan Note (Signed)
Patient encouraged to maintain heart healthy diet, regular exercise, adequate sleep. Consider daily probiotics. Take medications as prescribed. Given and reviewed copy of ACP documents from [place] Secretary of State and encouraged to complete and return 

## 2016-07-28 NOTE — Patient Instructions (Signed)
Excedrin Tension for migraines Preventive Care for Adults, Female A healthy lifestyle and preventive care can promote health and wellness. Preventive health guidelines for women include the following key practices.  A routine yearly physical is a good way to check with your health care provider about your health and preventive screening. It is a chance to share any concerns and updates on your health and to receive a thorough exam.  Visit your dentist for a routine exam and preventive care every 6 months. Brush your teeth twice a day and floss once a day. Good oral hygiene prevents tooth decay and gum disease.  The frequency of eye exams is based on your age, health, family medical history, use of contact lenses, and other factors. Follow your health care provider's recommendations for frequency of eye exams.  Eat a healthy diet. Foods like vegetables, fruits, whole grains, low-fat dairy products, and lean protein foods contain the nutrients you need without too many calories. Decrease your intake of foods high in solid fats, added sugars, and salt. Eat the right amount of calories for you.Get information about a proper diet from your health care provider, if necessary.  Regular physical exercise is one of the most important things you can do for your health. Most adults should get at least 150 minutes of moderate-intensity exercise (any activity that increases your heart rate and causes you to sweat) each week. In addition, most adults need muscle-strengthening exercises on 2 or more days a week.  Maintain a healthy weight. The body mass index (BMI) is a screening tool to identify possible weight problems. It provides an estimate of body fat based on height and weight. Your health care provider can find your BMI and can help you achieve or maintain a healthy weight.For adults 20 years and older:  A BMI below 18.5 is considered underweight.  A BMI of 18.5 to 24.9 is normal.  A BMI of 25 to 29.9  is considered overweight.  A BMI of 30 and above is considered obese.  Maintain normal blood lipids and cholesterol levels by exercising and minimizing your intake of saturated fat. Eat a balanced diet with plenty of fruit and vegetables. Blood tests for lipids and cholesterol should begin at age 53 and be repeated every 5 years. If your lipid or cholesterol levels are high, you are over 50, or you are at high risk for heart disease, you may need your cholesterol levels checked more frequently.Ongoing high lipid and cholesterol levels should be treated with medicines if diet and exercise are not working.  If you smoke, find out from your health care provider how to quit. If you do not use tobacco, do not start.  Lung cancer screening is recommended for adults aged 28-80 years who are at high risk for developing lung cancer because of a history of smoking. A yearly low-dose CT scan of the lungs is recommended for people who have at least a 30-pack-year history of smoking and are a current smoker or have quit within the past 15 years. A pack year of smoking is smoking an average of 1 pack of cigarettes a day for 1 year (for example: 1 pack a day for 30 years or 2 packs a day for 15 years). Yearly screening should continue until the smoker has stopped smoking for at least 15 years. Yearly screening should be stopped for people who develop a health problem that would prevent them from having lung cancer treatment.  If you are pregnant, do not drink  alcohol. If you are breastfeeding, be very cautious about drinking alcohol. If you are not pregnant and choose to drink alcohol, do not have more than 1 drink per day. One drink is considered to be 12 ounces (355 mL) of beer, 5 ounces (148 mL) of wine, or 1.5 ounces (44 mL) of liquor.  Avoid use of street drugs. Do not share needles with anyone. Ask for help if you need support or instructions about stopping the use of drugs.  High blood pressure causes heart  disease and increases the risk of stroke. Your blood pressure should be checked at least every 1 to 2 years. Ongoing high blood pressure should be treated with medicines if weight loss and exercise do not work.  If you are 75-48 years old, ask your health care provider if you should take aspirin to prevent strokes.  Diabetes screening is done by taking a blood sample to check your blood glucose level after you have not eaten for a certain period of time (fasting). If you are not overweight and you do not have risk factors for diabetes, you should be screened once every 3 years starting at age 21. If you are overweight or obese and you are 67-59 years of age, you should be screened for diabetes every year as part of your cardiovascular risk assessment.  Breast cancer screening is essential preventive care for women. You should practice "breast self-awareness." This means understanding the normal appearance and feel of your breasts and may include breast self-examination. Any changes detected, no matter how small, should be reported to a health care provider. Women in their 71s and 30s should have a clinical breast exam (CBE) by a health care provider as part of a regular health exam every 1 to 3 years. After age 52, women should have a CBE every year. Starting at age 97, women should consider having a mammogram (breast X-ray test) every year. Women who have a family history of breast cancer should talk to their health care provider about genetic screening. Women at a high risk of breast cancer should talk to their health care providers about having an MRI and a mammogram every year.  Breast cancer gene (BRCA)-related cancer risk assessment is recommended for women who have family members with BRCA-related cancers. BRCA-related cancers include breast, ovarian, tubal, and peritoneal cancers. Having family members with these cancers may be associated with an increased risk for harmful changes (mutations) in the  breast cancer genes BRCA1 and BRCA2. Results of the assessment will determine the need for genetic counseling and BRCA1 and BRCA2 testing.  Your health care provider may recommend that you be screened regularly for cancer of the pelvic organs (ovaries, uterus, and vagina). This screening involves a pelvic examination, including checking for microscopic changes to the surface of your cervix (Pap test). You may be encouraged to have this screening done every 3 years, beginning at age 45.  For women ages 62-65, health care providers may recommend pelvic exams and Pap testing every 3 years, or they may recommend the Pap and pelvic exam, combined with testing for human papilloma virus (HPV), every 5 years. Some types of HPV increase your risk of cervical cancer. Testing for HPV may also be done on women of any age with unclear Pap test results.  Other health care providers may not recommend any screening for nonpregnant women who are considered low risk for pelvic cancer and who do not have symptoms. Ask your health care provider if a screening pelvic  exam is right for you.  If you have had past treatment for cervical cancer or a condition that could lead to cancer, you need Pap tests and screening for cancer for at least 20 years after your treatment. If Pap tests have been discontinued, your risk factors (such as having a new sexual partner) need to be reassessed to determine if screening should resume. Some women have medical problems that increase the chance of getting cervical cancer. In these cases, your health care provider may recommend more frequent screening and Pap tests.  Colorectal cancer can be detected and often prevented. Most routine colorectal cancer screening begins at the age of 27 years and continues through age 54 years. However, your health care provider may recommend screening at an earlier age if you have risk factors for colon cancer. On a yearly basis, your health care provider may  provide home test kits to check for hidden blood in the stool. Use of a small camera at the end of a tube, to directly examine the colon (sigmoidoscopy or colonoscopy), can detect the earliest forms of colorectal cancer. Talk to your health care provider about this at age 65, when routine screening begins. Direct exam of the colon should be repeated every 5-10 years through age 25 years, unless early forms of precancerous polyps or small growths are found.  People who are at an increased risk for hepatitis B should be screened for this virus. You are considered at high risk for hepatitis B if:  You were born in a country where hepatitis B occurs often. Talk with your health care provider about which countries are considered high risk.  Your parents were born in a high-risk country and you have not received a shot to protect against hepatitis B (hepatitis B vaccine).  You have HIV or AIDS.  You use needles to inject street drugs.  You live with, or have sex with, someone who has hepatitis B.  You get hemodialysis treatment.  You take certain medicines for conditions like cancer, organ transplantation, and autoimmune conditions.  Hepatitis C blood testing is recommended for all people born from [birth year] through 1965 and any individual with known risks for hepatitis C.  Practice safe sex. Use condoms and avoid high-risk sexual practices to reduce the spread of sexually transmitted infections (STIs). STIs include gonorrhea, chlamydia, syphilis, trichomonas, herpes, HPV, and human immunodeficiency virus (HIV). Herpes, HIV, and HPV are viral illnesses that have no cure. They can result in disability, cancer, and death.  You should be screened for sexually transmitted illnesses (STIs) including gonorrhea and chlamydia if:  You are sexually active and are younger than 24 years.  You are older than 24 years and your health care provider tells you that you are at risk for this type of infection.  Your  sexual activity has changed since you were last screened and you are at an increased risk for chlamydia or gonorrhea. Ask your health care provider if you are at risk.  If you are at risk of being infected with HIV, it is recommended that you take a prescription medicine daily to prevent HIV infection. This is called preexposure prophylaxis (PrEP). You are considered at risk if:  You are sexually active and do not regularly use condoms or know the HIV status of your partner(s).  You take drugs by injection.  You are sexually active with a partner who has HIV.  Talk with your health care provider about whether you are at high risk of being  infected with HIV. If you choose to begin PrEP, you should first be tested for HIV. You should then be tested every 3 months for as long as you are taking PrEP.  Osteoporosis is a disease in which the bones lose minerals and strength with aging. This can result in serious bone fractures or breaks. The risk of osteoporosis can be identified using a bone density scan. Women ages 49 years and over and women at risk for fractures or osteoporosis should discuss screening with their health care providers. Ask your health care provider whether you should take a calcium supplement or vitamin D to reduce the rate of osteoporosis.  Menopause can be associated with physical symptoms and risks. Hormone replacement therapy is available to decrease symptoms and risks. You should talk to your health care provider about whether hormone replacement therapy is right for you.  Use sunscreen. Apply sunscreen liberally and repeatedly throughout the day. You should seek shade when your shadow is shorter than you. Protect yourself by wearing long sleeves, pants, a wide-brimmed hat, and sunglasses year round, whenever you are outdoors.  Once a month, do a whole body skin exam, using a mirror to look at the skin on your back. Tell your health care provider of new moles, moles that have  irregular borders, moles that are larger than a pencil eraser, or moles that have changed in shape or color.  Stay current with required vaccines (immunizations).  Influenza vaccine. All adults should be immunized every year.  Tetanus, diphtheria, and acellular pertussis (Td, Tdap) vaccine. Pregnant women should receive 1 dose of Tdap vaccine during each pregnancy. The dose should be obtained regardless of the length of time since the last dose. Immunization is preferred during the 27th-36th week of gestation. An adult who has not previously received Tdap or who does not know her vaccine status should receive 1 dose of Tdap. This initial dose should be followed by tetanus and diphtheria toxoids (Td) booster doses every 10 years. Adults with an unknown or incomplete history of completing a 3-dose immunization series with Td-containing vaccines should begin or complete a primary immunization series including a Tdap dose. Adults should receive a Td booster every 10 years.  Varicella vaccine. An adult without evidence of immunity to varicella should receive 2 doses or a second dose if she has previously received 1 dose. Pregnant females who do not have evidence of immunity should receive the first dose after pregnancy. This first dose should be obtained before leaving the health care facility. The second dose should be obtained 4-8 weeks after the first dose.  Human papillomavirus (HPV) vaccine. Females aged 13-26 years who have not received the vaccine previously should obtain the 3-dose series. The vaccine is not recommended for use in pregnant females. However, pregnancy testing is not needed before receiving a dose. If a female is found to be pregnant after receiving a dose, no treatment is needed. In that case, the remaining doses should be delayed until after the pregnancy. Immunization is recommended for any person with an immunocompromised condition through the age of 28 years if she did not get any or  all doses earlier. During the 3-dose series, the second dose should be obtained 4-8 weeks after the first dose. The third dose should be obtained 24 weeks after the first dose and 16 weeks after the second dose.  Zoster vaccine. One dose is recommended for adults aged 68 years or older unless certain conditions are present.  Measles, mumps, and rubella (  MMR) vaccine. Adults born before [birth year] generally are considered immune to measles and mumps. Adults born in [birth year] or later should have 1 or more doses of MMR vaccine unless there is a contraindication to the vaccine or there is laboratory evidence of immunity to each of the three diseases. A routine second dose of MMR vaccine should be obtained at least 28 days after the first dose for students attending postsecondary schools, health care workers, or international travelers. People who received inactivated measles vaccine or an unknown type of measles vaccine during 1963-1967 should receive 2 doses of MMR vaccine. People who received inactivated mumps vaccine or an unknown type of mumps vaccine before 1979 and are at high risk for mumps infection should consider immunization with 2 doses of MMR vaccine. For females of childbearing age, rubella immunity should be determined. If there is no evidence of immunity, females who are not pregnant should be vaccinated. If there is no evidence of immunity, females who are pregnant should delay immunization until after pregnancy. Unvaccinated health care workers born before [birth year] who lack laboratory evidence of measles, mumps, or rubella immunity or laboratory confirmation of disease should consider measles and mumps immunization with 2 doses of MMR vaccine or rubella immunization with 1 dose of MMR vaccine.  Pneumococcal 13-valent conjugate (PCV13) vaccine. When indicated, a person who is uncertain of his immunization history and has no record of immunization should receive the PCV13 vaccine. All adults 81 years of age and  older should receive this vaccine. An adult aged 66 years or older who has certain medical conditions and has not been previously immunized should receive 1 dose of PCV13 vaccine. This PCV13 should be followed with a dose of pneumococcal polysaccharide (PPSV23) vaccine. Adults who are at high risk for pneumococcal disease should obtain the PPSV23 vaccine at least 8 weeks after the dose of PCV13 vaccine. Adults older than 45 years of age who have normal immune system function should obtain the PPSV23 vaccine dose at least 1 year after the dose of PCV13 vaccine.  Pneumococcal polysaccharide (PPSV23) vaccine. When PCV13 is also indicated, PCV13 should be obtained first. All adults aged 46 years and older should be immunized. An adult younger than age 42 years who has certain medical conditions should be immunized. Any person who resides in a nursing home or long-term care facility should be immunized. An adult smoker should be immunized. People with an immunocompromised condition and certain other conditions should receive both PCV13 and PPSV23 vaccines. People with human immunodeficiency virus (HIV) infection should be immunized as soon as possible after diagnosis. Immunization during chemotherapy or radiation therapy should be avoided. Routine use of PPSV23 vaccine is not recommended for American Indians, [place] Natives, or people younger than 65 years unless there are medical conditions that require PPSV23 vaccine. When indicated, people who have unknown immunization and have no record of immunization should receive PPSV23 vaccine. One-time revaccination 5 years after the first dose of PPSV23 is recommended for people aged 19-64 years who have chronic kidney failure, nephrotic syndrome, asplenia, or immunocompromised conditions. People who received 1-2 doses of PPSV23 before age 30 years should receive another dose of PPSV23 vaccine at age 26 years or later if at least 5 years have passed since the previous dose.  Doses of PPSV23 are not needed for people immunized with PPSV23 at or after age 74 years.  Meningococcal vaccine. Adults with asplenia or persistent complement component deficiencies should receive 2 doses of quadrivalent meningococcal conjugate (MenACWY-D) vaccine. The  doses should be obtained at least 2 months apart. Microbiologists working with certain meningococcal bacteria, [place] recruits, people at risk during an outbreak, and people who travel to or live in countries with a high rate of meningitis should be immunized. A first-year college student up through age 42 years who is living in a residence hall should receive a dose if she did not receive a dose on or after her 16th birthday. Adults who have certain high-risk conditions should receive one or more doses of vaccine.  Hepatitis A vaccine. Adults who wish to be protected from this disease, have certain high-risk conditions, work with hepatitis A-infected animals, work in hepatitis A research labs, or travel to or work in countries with a high rate of hepatitis A should be immunized. Adults who were previously unvaccinated and who anticipate close contact with an international adoptee during the first 60 days after arrival in the [place] from a country with a high rate of hepatitis A should be immunized.  Hepatitis B vaccine. Adults who wish to be protected from this disease, have certain high-risk conditions, may be exposed to blood or other infectious body fluids, are household contacts or sex partners of hepatitis B positive people, are clients or workers in certain care facilities, or travel to or work in countries with a high rate of hepatitis B should be immunized.  Haemophilus influenzae type b (Hib) vaccine. A previously unvaccinated person with asplenia or sickle cell disease or having a scheduled splenectomy should receive 1 dose of Hib vaccine. Regardless of previous immunization, a recipient of a hematopoietic stem cell  transplant should receive a 3-dose series 6-12 months after her successful transplant. Hib vaccine is not recommended for adults with HIV infection. Preventive Services / Frequency Ages 41 to 10 years  Blood pressure check.** / Every 3-5 years.  Lipid and cholesterol check.** / Every 5 years beginning at age 70.  Clinical breast exam.** / Every 3 years for women in their 58s and 30s.  BRCA-related cancer risk assessment.** / For women who have family members with a BRCA-related cancer (breast, ovarian, tubal, or peritoneal cancers).  Pap test.** / Every 2 years from ages 72 through 35. Every 3 years starting at age 80 through age 31 or 42 with a history of 3 consecutive normal Pap tests.  HPV screening.** / Every 3 years from ages 76 through ages 58 to 66 with a history of 3 consecutive normal Pap tests.  Hepatitis C blood test.** / For any individual with known risks for hepatitis C.  Skin self-exam. / Monthly.  Influenza vaccine. / Every year.  Tetanus, diphtheria, and acellular pertussis (Tdap, Td) vaccine.** / Consult your health care provider. Pregnant women should receive 1 dose of Tdap vaccine during each pregnancy. 1 dose of Td every 10 years.  Varicella vaccine.** / Consult your health care provider. Pregnant females who do not have evidence of immunity should receive the first dose after pregnancy.  HPV vaccine. / 3 doses over 6 months, if 64 and younger. The vaccine is not recommended for use in pregnant females. However, pregnancy testing is not needed before receiving a dose.  Measles, mumps, rubella (MMR) vaccine.** / You need at least 1 dose of MMR if you were born in 1957 or later. You may also need a 2nd dose. For females of childbearing age, rubella immunity should be determined. If there is no evidence of immunity, females who are not pregnant should be vaccinated. If there is no evidence of  immunity, females who are pregnant should delay immunization until after  pregnancy.  Pneumococcal 13-valent conjugate (PCV13) vaccine.** / Consult your health care provider.  Pneumococcal polysaccharide (PPSV23) vaccine.** / 1 to 2 doses if you smoke cigarettes or if you have certain conditions.  Meningococcal vaccine.** / 1 dose if you are age 72 to 9 years and a Market researcher living in a residence hall, or have one of several medical conditions, you need to get vaccinated against meningococcal disease. You may also need additional booster doses.  Hepatitis A vaccine.** / Consult your health care provider.  Hepatitis B vaccine.** / Consult your health care provider.  Haemophilus influenzae type b (Hib) vaccine.** / Consult your health care provider. Ages 76 to 47 years  Blood pressure check.** / Every year.  Lipid and cholesterol check.** / Every 5 years beginning at age 2 years.  Lung cancer screening. / Every year if you are aged 53-80 years and have a 30-pack-year history of smoking and currently smoke or have quit within the past 15 years. Yearly screening is stopped once you have quit smoking for at least 15 years or develop a health problem that would prevent you from having lung cancer treatment.  Clinical breast exam.** / Every year after age 32 years.  BRCA-related cancer risk assessment.** / For women who have family members with a BRCA-related cancer (breast, ovarian, tubal, or peritoneal cancers).  Mammogram.** / Every year beginning at age 44 years and continuing for as long as you are in good health. Consult with your health care provider.  Pap test.** / Every 3 years starting at age 40 years through age 85 or 66 years with a history of 3 consecutive normal Pap tests.  HPV screening.** / Every 3 years from ages 36 years through ages 67 to 60 years with a history of 3 consecutive normal Pap tests.  Fecal occult blood test (FOBT) of stool. / Every year beginning at age 73 years and continuing until age 46 years. You may not need  to do this test if you get a colonoscopy every 10 years.  Flexible sigmoidoscopy or colonoscopy.** / Every 5 years for a flexible sigmoidoscopy or every 10 years for a colonoscopy beginning at age 14 years and continuing until age 64 years.  Hepatitis C blood test.** / For all people born from [birth year] through 1965 and any individual with known risks for hepatitis C.  Skin self-exam. / Monthly.  Influenza vaccine. / Every year.  Tetanus, diphtheria, and acellular pertussis (Tdap/Td) vaccine.** / Consult your health care provider. Pregnant women should receive 1 dose of Tdap vaccine during each pregnancy. 1 dose of Td every 10 years.  Varicella vaccine.** / Consult your health care provider. Pregnant females who do not have evidence of immunity should receive the first dose after pregnancy.  Zoster vaccine.** / 1 dose for adults aged 23 years or older.  Measles, mumps, rubella (MMR) vaccine.** / You need at least 1 dose of MMR if you were born in 1957 or later. You may also need a second dose. For females of childbearing age, rubella immunity should be determined. If there is no evidence of immunity, females who are not pregnant should be vaccinated. If there is no evidence of immunity, females who are pregnant should delay immunization until after pregnancy.  Pneumococcal 13-valent conjugate (PCV13) vaccine.** / Consult your health care provider.  Pneumococcal polysaccharide (PPSV23) vaccine.** / 1 to 2 doses if you smoke cigarettes or if you have certain conditions.  Meningococcal vaccine.** / Consult your health care provider.  Hepatitis A vaccine.** / Consult your health care provider.  Hepatitis B vaccine.** / Consult your health care provider.  Haemophilus influenzae type b (Hib) vaccine.** / Consult your health care provider. Ages 54 years and over  Blood pressure check.** / Every year.  Lipid and cholesterol check.** / Every 5 years beginning at age 87 years.  Lung cancer  screening. / Every year if you are aged 71-80 years and have a 30-pack-year history of smoking and currently smoke or have quit within the past 15 years. Yearly screening is stopped once you have quit smoking for at least 15 years or develop a health problem that would prevent you from having lung cancer treatment.  Clinical breast exam.** / Every year after age 68 years.  BRCA-related cancer risk assessment.** / For women who have family members with a BRCA-related cancer (breast, ovarian, tubal, or peritoneal cancers).  Mammogram.** / Every year beginning at age 93 years and continuing for as long as you are in good health. Consult with your health care provider.  Pap test.** / Every 3 years starting at age 76 years through age 45 or 58 years with 3 consecutive normal Pap tests. Testing can be stopped between 65 and 70 years with 3 consecutive normal Pap tests and no abnormal Pap or HPV tests in the past 10 years.  HPV screening.** / Every 3 years from ages 37 years through ages 35 or 59 years with a history of 3 consecutive normal Pap tests. Testing can be stopped between 65 and 70 years with 3 consecutive normal Pap tests and no abnormal Pap or HPV tests in the past 10 years.  Fecal occult blood test (FOBT) of stool. / Every year beginning at age 5 years and continuing until age 66 years. You may not need to do this test if you get a colonoscopy every 10 years.  Flexible sigmoidoscopy or colonoscopy.** / Every 5 years for a flexible sigmoidoscopy or every 10 years for a colonoscopy beginning at age 49 years and continuing until age 81 years.  Hepatitis C blood test.** / For all people born from [birth year] through 1965 and any individual with known risks for hepatitis C.  Osteoporosis screening.** / A one-time screening for women ages 28 years and over and women at risk for fractures or osteoporosis.  Skin self-exam. / Monthly.  Influenza vaccine. / Every year.  Tetanus, diphtheria, and  acellular pertussis (Tdap/Td) vaccine.** / 1 dose of Td every 10 years.  Varicella vaccine.** / Consult your health care provider.  Zoster vaccine.** / 1 dose for adults aged 36 years or older.  Pneumococcal 13-valent conjugate (PCV13) vaccine.** / Consult your health care provider.  Pneumococcal polysaccharide (PPSV23) vaccine.** / 1 dose for all adults aged 39 years and older.  Meningococcal vaccine.** / Consult your health care provider.  Hepatitis A vaccine.** / Consult your health care provider.  Hepatitis B vaccine.** / Consult your health care provider.  Haemophilus influenzae type b (Hib) vaccine.** / Consult your health care provider. ** Family history and personal history of risk and conditions may change your health care provider's recommendations.   This information is not intended to replace advice given to you by your health care provider. Make sure you discuss any questions you have with your health care provider.   Document Released: 11/03/2001 Document Revised: 09/28/2014 Document Reviewed: 02/02/2011 Elsevier Interactive Patient Education [employer].

## 2016-07-28 NOTE — Assessment & Plan Note (Signed)
minimize simple carbs. Increase exercise as tolerated. Check hgba1c today 

## 2016-07-28 NOTE — Assessment & Plan Note (Addendum)
Encouraged increased hydration, 64 ounces of clear fluids daily. Minimize alcohol and caffeine. Eat small frequent meals with lean proteins and complex carbs. Avoid high and low blood sugars. Get adequate sleep, 7-8 hours a night. Needs exercise daily preferably in the morning. Better since adjusting glasses. Worse right before cycles. Uses Advil and Caffeine, caffeine does not help the hormonal migraines as much, consider Excedrin Tension

## 2016-07-29 LAB — CYTOLOGY - PAP
CHLAMYDIA, DNA PROBE: NEGATIVE
DIAGNOSIS: NEGATIVE
HPV (WINDOPATH): NOT DETECTED
Neisseria Gonorrhea: NEGATIVE
Trichomonas: NEGATIVE

## 2016-07-29 LAB — URINE CULTURE: Organism ID, Bacteria: NO GROWTH

## 2016-07-31 ENCOUNTER — Telehealth: Payer: Self-pay | Admitting: Family Medicine

## 2016-07-31 ENCOUNTER — Other Ambulatory Visit: Payer: Self-pay | Admitting: Family Medicine

## 2016-07-31 LAB — CERVICOVAGINAL ANCILLARY ONLY
BACTERIAL VAGINITIS: POSITIVE — AB
CANDIDA VAGINITIS: NEGATIVE

## 2016-07-31 MED ORDER — METRONIDAZOLE 500 MG PO TABS: 500.0000 mg | ORAL_TABLET | Freq: Two times a day (BID) | ORAL | 0 refills | Status: DC

## 2016-07-31 NOTE — Telephone Encounter (Signed)
Patient has been informed now of results/instructions.

## 2016-07-31 NOTE — Telephone Encounter (Signed)
Caller name: Relationship to patient: Self Can be reached: [phone]   Reason for call: Request a call back to get lab results and find out if she needs an antibiotic. States she does not understand what she see on my chart

## 2016-08-03 LAB — CERVICOVAGINAL ANCILLARY ONLY: HERPES (WINDOWPATH): NEGATIVE

## 2016-08-05 ENCOUNTER — Ambulatory Visit (INDEPENDENT_AMBULATORY_CARE_PROVIDER_SITE_OTHER): Payer: 59 | Admitting: Psychology

## 2016-08-05 DIAGNOSIS — F4323 Adjustment disorder with mixed anxiety and depressed mood: Secondary | ICD-10-CM | POA: Diagnosis not present

## 2016-08-20 ENCOUNTER — Telehealth: Payer: 59 | Admitting: Nurse Practitioner

## 2016-08-20 DIAGNOSIS — N390 Urinary tract infection, site not specified: Secondary | ICD-10-CM

## 2016-08-20 MED ORDER — NITROFURANTOIN MONOHYD MACRO 100 MG PO CAPS: 100.0000 mg | ORAL_CAPSULE | Freq: Two times a day (BID) | ORAL | 0 refills | Status: DC

## 2016-08-20 NOTE — Progress Notes (Signed)
We are sorry that you are not feeling well.  Here is how we plan to help!  Based on what you shared with me it looks like you most likely have a simple urinary tract infection.  A UTI (Urinary Tract Infection) is a bacterial infection of the bladder.  Most cases of urinary tract infections are simple to treat but a key part of your care is to encourage you to drink plenty of fluids and watch your symptoms carefully.  I have prescribed MacroBid 100 mg twice a day for 5 days.  Your symptoms should gradually improve. Call us if the burning in your urine worsens, you develop worsening fever, back pain or pelvic pain or if your symptoms do not resolve after completing the antibiotic.  Once you have completed this medication, please follow up with Dr. Randel Pigg regarding prophylactic treatment.    Urinary tract infections can be prevented by drinking plenty of water to keep your body hydrated.  Also be sure when you wipe, wipe from front to back and don't hold it in!  If possible, empty your bladder every 4 hours.  Your e-visit answers were reviewed by a board certified advanced clinical practitioner to complete your personal care plan.  Depending on the condition, your plan could have included both over the counter or prescription medications.  If there is a problem please reply  once you have received a response from your provider.  Your safety is important to [place].  If you have drug allergies check your prescription carefully.    You can use MyChart to ask questions about today's visit, request a non-urgent call back, or ask for a work or school excuse for 24 hours related to this e-Visit. If it has been greater than 24 hours you will need to follow up with your provider, or enter a new e-Visit to address those concerns.   You will get an e-mail in the next two days asking about your experience.  I hope that your e-visit has been valuable and will speed your recovery. Thank you for using e-visits.

## 2016-08-26 ENCOUNTER — Ambulatory Visit (INDEPENDENT_AMBULATORY_CARE_PROVIDER_SITE_OTHER): Payer: 59 | Admitting: Psychology

## 2016-08-26 DIAGNOSIS — F4323 Adjustment disorder with mixed anxiety and depressed mood: Secondary | ICD-10-CM

## 2016-09-09 ENCOUNTER — Ambulatory Visit (INDEPENDENT_AMBULATORY_CARE_PROVIDER_SITE_OTHER): Payer: 59 | Admitting: Psychology

## 2016-09-09 DIAGNOSIS — F4323 Adjustment disorder with mixed anxiety and depressed mood: Secondary | ICD-10-CM

## 2016-09-23 ENCOUNTER — Ambulatory Visit (INDEPENDENT_AMBULATORY_CARE_PROVIDER_SITE_OTHER): Payer: 59 | Admitting: Psychology

## 2016-09-23 DIAGNOSIS — F4323 Adjustment disorder with mixed anxiety and depressed mood: Secondary | ICD-10-CM

## 2016-10-13 ENCOUNTER — Ambulatory Visit (INDEPENDENT_AMBULATORY_CARE_PROVIDER_SITE_OTHER): Payer: 59 | Admitting: Psychology

## 2016-10-13 DIAGNOSIS — F4322 Adjustment disorder with anxiety: Secondary | ICD-10-CM

## 2016-10-26 DIAGNOSIS — R300 Dysuria: Secondary | ICD-10-CM | POA: Diagnosis not present

## 2016-10-26 DIAGNOSIS — N926 Irregular menstruation, unspecified: Secondary | ICD-10-CM | POA: Diagnosis not present

## 2016-10-26 DIAGNOSIS — N390 Urinary tract infection, site not specified: Secondary | ICD-10-CM | POA: Diagnosis not present

## 2016-11-03 ENCOUNTER — Ambulatory Visit (INDEPENDENT_AMBULATORY_CARE_PROVIDER_SITE_OTHER): Payer: 59 | Admitting: Psychology

## 2016-11-03 DIAGNOSIS — F4322 Adjustment disorder with anxiety: Secondary | ICD-10-CM | POA: Diagnosis not present

## 2016-11-19 DIAGNOSIS — R300 Dysuria: Secondary | ICD-10-CM | POA: Diagnosis not present

## 2016-11-23 ENCOUNTER — Ambulatory Visit (INDEPENDENT_AMBULATORY_CARE_PROVIDER_SITE_OTHER): Payer: 59 | Admitting: Psychology

## 2016-11-23 DIAGNOSIS — F4322 Adjustment disorder with anxiety: Secondary | ICD-10-CM | POA: Diagnosis not present

## 2016-12-10 DIAGNOSIS — R300 Dysuria: Secondary | ICD-10-CM | POA: Diagnosis not present

## 2016-12-15 ENCOUNTER — Ambulatory Visit (INDEPENDENT_AMBULATORY_CARE_PROVIDER_SITE_OTHER): Payer: 59 | Admitting: Psychology

## 2016-12-15 DIAGNOSIS — F4322 Adjustment disorder with anxiety: Secondary | ICD-10-CM | POA: Diagnosis not present

## 2017-01-05 ENCOUNTER — Ambulatory Visit (INDEPENDENT_AMBULATORY_CARE_PROVIDER_SITE_OTHER): Payer: 59 | Admitting: Psychology

## 2017-01-05 DIAGNOSIS — F4322 Adjustment disorder with anxiety: Secondary | ICD-10-CM | POA: Diagnosis not present

## 2017-01-26 ENCOUNTER — Ambulatory Visit (INDEPENDENT_AMBULATORY_CARE_PROVIDER_SITE_OTHER): Payer: 59 | Admitting: Psychology

## 2017-01-26 DIAGNOSIS — F4322 Adjustment disorder with anxiety: Secondary | ICD-10-CM

## 2017-02-16 ENCOUNTER — Ambulatory Visit (INDEPENDENT_AMBULATORY_CARE_PROVIDER_SITE_OTHER): Payer: 59 | Admitting: Family Medicine

## 2017-02-16 ENCOUNTER — Ambulatory Visit: Payer: 59 | Admitting: Family Medicine

## 2017-02-16 ENCOUNTER — Encounter: Payer: Self-pay | Admitting: Family Medicine

## 2017-02-16 DIAGNOSIS — M79674 Pain in right toe(s): Secondary | ICD-10-CM

## 2017-02-16 HISTORY — DX: Pain in right toe(s): M79.674

## 2017-02-16 NOTE — Progress Notes (Signed)
Subjective:  I acted as a Education administrator for Dr. Charlett Blake. [place], [place]   [name], female    DOB: 26-May-1971, 46 y.o.   MRN: [id number]  Chief Complaint  Patient presents with  . Toe Pain    HPI  Patient is in today for an acute visit for her Right small toe pain. She states she had "hammertoe" surgery years ago and the pain is coming back. She stated when she wears closed toe shoes she feels the pain worst. 5th toe hurting for past several months. Worse with closed toe shoes. Hurts daily no redness pr warmth or recent injury. Did have a similar pain in the same toe in college and they actually had to do surgery to correct the pain describes a fusion of the DIP joint.. It felt well for years. Now she hurts daily. Notes the pain is worse when palpating the callous that has formed over the lateral aspect of the toe. Feels well when she wears sandels. apply topical lidocaine bid and referred to podiatry. Callous noted  Patient Care Team: Mosie Lukes, MD as PCP - General (Family Medicine)   Past Medical History:  Diagnosis Date  . Allergic state 08/30/2012  . Anxiety state 07/01/2014  . Asthma    cats, dust, triggers  . Asthma   . Asthma exacerbation   . Atypical moles 03/03/2012  . Breast lump on right side at 10 o'clock position 03/21/2012  . Chicken pox as a child  . Gestational thrombocytopenia ([place]) 02/29/2012  . History of recurrent UTIs 07/28/2016  . Hyperglycemia 02/29/2012  . Hyperlipidemia 07/01/2013  . Migraine 06/26/2013  . Migraine 07/01/2013  . Mumps as a child  . Neck pain 07/01/2014  . Preventative health care 03/03/2012  . Skin lesion 07/01/2015   Halo lesion on back present since college Raised verrucous lesion right ankle for several years.  . Thrombocytopenia ([place]) 02/29/2012   Possible anemia/low iron? In past per patient   . Toe pain, right 02/16/2017  . UTI (lower urinary tract infection) 04/14/2012    Past Surgical History:  Procedure Laterality  Date  . [place], 2001,2003   X 3  . HAMMER TOE SURGERY  1992   right 5th toe  . TUBAL LIGATION      Family History  Problem Relation Age of Onset  . Thyroid disease Father        Hashimoto's  . Depression Sister        related to cycle  . Muscular dystrophy Son        Donnajean Lopes  . Other Son        Neurogenic bladder  . Asthma Son        mild  . Celiac disease Son   . Stroke Maternal Grandmother   . Heart attack Maternal Grandfather        ?  Marland Kitchen Heart disease Maternal Grandfather   . Diabetes Paternal Grandmother        type 2  . Hypertension Paternal Grandmother   . Obesity Paternal Grandmother   . Other Sister        gluten intolerate or celiac, rectocele repair x 2 with bowel adhesions.  . Stroke Sister 15       s/p infections and surgeries  . Depression Sister   . Fibromyalgia Sister   . Thyroid disease Sister   . Cholelithiasis Sister   . Celiac disease Son   . Kidney disease Maternal Uncle   .  Diabetes Maternal Uncle   . Diabetes Mother        2, diet controlled    Social History   Social History  . Marital status: Married    Spouse name: N/A  . Number of children: N/A  . Years of education: N/A   Occupational History  . Not on file.   Social History Main Topics  . Smoking status: Never Smoker  . Smokeless tobacco: Never Used  . Alcohol use No  . Drug use: No  . Sexual activity: Yes    Partners: Male   Other Topics Concern  . Not on file   Social History Narrative  . No narrative on file    Outpatient Medications Prior to Visit  Medication Sig Dispense Refill  . albuterol (PROVENTIL HFA;VENTOLIN HFA) 108 (90 Base) MCG/ACT inhaler Inhale 2 puffs into the lungs every 6 (six) hours as needed for wheezing. 1 Inhaler 1  . metroNIDAZOLE (FLAGYL) 500 MG tablet Take 1 tablet (500 mg total) by mouth 2 (two) times daily. Take for 7 days. 14 tablet 0  . nitrofurantoin (MACRODANTIN) 50 MG capsule Take 1 capsule (50 mg total) by  mouth daily as needed. [place] 30 capsule 1  . nitrofurantoin, macrocrystal-monohydrate, (MACROBID) 100 MG capsule Take 1 capsule (100 mg total) by mouth 2 (two) times daily. 10 capsule 0   No facility-administered medications prior to visit.     No Known Allergies  Review of Systems  Constitutional: Negative for fever and malaise/fatigue.  HENT: Negative for congestion.   Eyes: Negative for blurred vision.  Respiratory: Negative for cough and shortness of breath.   Cardiovascular: Negative for chest pain, palpitations and leg swelling.  Gastrointestinal: Negative for vomiting.  Musculoskeletal: Positive for joint pain. Negative for back pain.  Skin: Negative for rash.  Neurological: Negative for loss of consciousness and headaches.       Objective:    Physical Exam  Constitutional: She is oriented to person, place, and time. She appears well-developed and well-nourished. No distress.  HENT:  Head: Normocephalic and atraumatic.  Eyes: Conjunctivae are normal.  Neck: Normal range of motion. No thyromegaly present.  Cardiovascular: Normal rate and regular rhythm.   Pulmonary/Chest: Effort normal and breath sounds normal. She has no wheezes.  Abdominal: Soft. Bowel sounds are normal. There is no tenderness.  Musculoskeletal: Normal range of motion. She exhibits no edema or deformity.  Neurological: She is alert and oriented to person, place, and time.  Skin: Skin is warm and dry. She is not diaphoretic.  Right 5 th toe with callous noted laterally  Psychiatric: She has a normal mood and affect.    BP 102/64 (BP Location: Left Arm, Patient Position: Sitting, Cuff Size: Normal)   Pulse 63   Temp 98.4 F (36.9 C) (Oral)   Resp 18   Wt 126 lb 9.6 oz (57.4 kg)   LMP 02/03/2017 (Approximate)   SpO2 99%   BMI 22.43 kg/m  Wt Readings from Last 3 Encounters:  02/16/17 126 lb 9.6 oz (57.4 kg)  07/28/16 120 lb 9.6 oz (54.7 kg)  07/01/15 120 lb 4 oz (54.5 kg)   BP Readings from  Last 3 Encounters:  02/16/17 102/64  07/28/16 96/72  07/01/15 94/70     Immunization History  Administered Date(s) Administered  . Influenza Split 06/16/2012  . Influenza,inj,Quad PF,36+ Mos 06/26/2013, 07/28/2016  . Td 09/21/2005  . Tdap 06/16/2012    Health Maintenance  Topic Date Due  . HIV Screening  12/28/1985  .  INFLUENZA VACCINE  04/21/2017  . PAP SMEAR  07/29/2019  . TETANUS/TDAP  06/16/2022    Lab Results  Component Value Date   WBC 5.2 07/28/2016   HGB 13.6 07/28/2016   HCT 40.8 07/28/2016   PLT 159.0 07/28/2016   GLUCOSE 85 07/28/2016   CHOL 140 07/28/2016   TRIG 71.0 07/28/2016   HDL 52.90 07/28/2016   LDLCALC 73 07/28/2016   ALT 11 07/28/2016   AST 16 07/28/2016   NA 138 07/28/2016   K 3.7 07/28/2016   CL 104 07/28/2016   CREATININE 0.81 07/28/2016   BUN 14 07/28/2016   CO2 29 07/28/2016   TSH 1.68 07/28/2016   HGBA1C 4.9 07/28/2016    Lab Results  Component Value Date   TSH 1.68 07/28/2016   Lab Results  Component Value Date   WBC 5.2 07/28/2016   HGB 13.6 07/28/2016   HCT 40.8 07/28/2016   MCV 91.0 07/28/2016   PLT 159.0 07/28/2016   Lab Results  Component Value Date   NA 138 07/28/2016   K 3.7 07/28/2016   CO2 29 07/28/2016   GLUCOSE 85 07/28/2016   BUN 14 07/28/2016   CREATININE 0.81 07/28/2016   BILITOT 0.7 07/28/2016   ALKPHOS 51 07/28/2016   AST 16 07/28/2016   ALT 11 07/28/2016   PROT 6.8 07/28/2016   ALBUMIN 4.3 07/28/2016   CALCIUM 9.5 07/28/2016   GFR 81.06 07/28/2016   Lab Results  Component Value Date   CHOL 140 07/28/2016   Lab Results  Component Value Date   HDL 52.90 07/28/2016   Lab Results  Component Value Date   LDLCALC 73 07/28/2016   Lab Results  Component Value Date   TRIG 71.0 07/28/2016   Lab Results  Component Value Date   CHOLHDL 3 07/28/2016   Lab Results  Component Value Date   HGBA1C 4.9 07/28/2016         Assessment & Plan:   Problem List Items Addressed This Visit    Toe  pain, right    5th toe hurting for past several months. Worse with closed toe shoes. Hurts daily no redness pr warmth or recent injury. Did have a similar pain in the same toe in college and they actually had to do surgery to correct the paishe describes the fusion of the DIP joint.. It felt well for years. Now she hurts daily. Notes the pain is worse when palpating the callous that has formed over the lateral aspect of the toe. Feels well when she wears sandels. apply topical lidocaine bid and referred to podiatry. Callous noted encouraged to use corn pads until seen by podiatry      Relevant Orders   Ambulatory referral to Podiatry      I have discontinued Ms. Wingerter's nitrofurantoin, metroNIDAZOLE, and nitrofurantoin (macrocrystal-monohydrate). I am also having her maintain her albuterol.  No orders of the defined types were placed in this encounter.   CMA served as Education administrator during this visit. History, Physical and Plan performed by medical provider. Documentation and orders reviewed and attested to.  Penni Homans, MD

## 2017-02-16 NOTE — Assessment & Plan Note (Addendum)
5th toe hurting for past several months. Worse with closed toe shoes. Hurts daily no redness pr warmth or recent injury. Did have a similar pain in the same toe in college and they actually had to do surgery to correct the paishe describes the fusion of the DIP joint.. It felt well for years. Now she hurts daily. Notes the pain is worse when palpating the callous that has formed over the lateral aspect of the toe. Feels well when she wears sandels. apply topical lidocaine bid and referred to podiatry. Callous noted encouraged to use corn pads until seen by podiatry

## 2017-02-16 NOTE — Patient Instructions (Signed)

## 2017-02-22 ENCOUNTER — Ambulatory Visit (INDEPENDENT_AMBULATORY_CARE_PROVIDER_SITE_OTHER): Payer: 59 | Admitting: Psychology

## 2017-02-22 DIAGNOSIS — F4322 Adjustment disorder with anxiety: Secondary | ICD-10-CM | POA: Diagnosis not present

## 2017-03-11 ENCOUNTER — Ambulatory Visit (INDEPENDENT_AMBULATORY_CARE_PROVIDER_SITE_OTHER): Payer: 59 | Admitting: Podiatry

## 2017-03-11 ENCOUNTER — Ambulatory Visit (INDEPENDENT_AMBULATORY_CARE_PROVIDER_SITE_OTHER): Payer: 59

## 2017-03-11 ENCOUNTER — Encounter: Payer: Self-pay | Admitting: Podiatry

## 2017-03-11 DIAGNOSIS — D169 Benign neoplasm of bone and articular cartilage, unspecified: Secondary | ICD-10-CM

## 2017-03-11 DIAGNOSIS — M79674 Pain in right toe(s): Secondary | ICD-10-CM | POA: Diagnosis not present

## 2017-03-11 NOTE — Progress Notes (Signed)
Subjective:    [name], female   DOB: 46 y.o.   MRN: [id number]   HPI patient presents stating she has a painful corn on the fifth digit right and she had had surgery when she was young to address it and it did take care of it for a number of years but over the last couple years it's become bothersome. States she's tried to trim it padded and wear wider shoes without relief    Review of Systems  All other systems reviewed and are negative.       Objective:  Physical Exam  Cardiovascular: Intact distal pulses.   Musculoskeletal: Normal range of motion.  Neurological: She is alert.  Skin: Skin is warm.  Nursing note and vitals reviewed.  neurovascular status intact muscle strength adequate range of motion within normal limits with keratotic lesion fifth digit right lateral side that's painful when pressed with good alignment of the toe overall. It is quite sore and it is red with no breakdown of tissue     Assessment:    Exostotic lesion fifth digit right     Plan:    H&P condition reviewed with patient and x-ray reviewed. At this point due to the long-standing nature and failure to respond to numerous conservative treatments I've recommended office exostectomy to be done and explained procedure and risk. She read consent form going over alternative treatments complications wants surgery signed consent form and understands recovery can take several months. Scheduled for office surgery in the next couple weeks and is encouraged to call with questions

## 2017-03-11 NOTE — Progress Notes (Signed)
   Subjective:    [name], female    DOB: 1971-07-23, 46 y.o.   MRN: [id number]  HPI    Review of Systems  Skin: Positive for color change.       Objective:   Physical Exam        Assessment & Plan:

## 2017-03-18 DIAGNOSIS — N390 Urinary tract infection, site not specified: Secondary | ICD-10-CM | POA: Diagnosis not present

## 2017-03-18 DIAGNOSIS — R350 Frequency of micturition: Secondary | ICD-10-CM | POA: Diagnosis not present

## 2017-03-18 DIAGNOSIS — N3001 Acute cystitis with hematuria: Secondary | ICD-10-CM | POA: Diagnosis not present

## 2017-03-22 ENCOUNTER — Ambulatory Visit (INDEPENDENT_AMBULATORY_CARE_PROVIDER_SITE_OTHER): Payer: 59 | Admitting: Psychology

## 2017-03-22 DIAGNOSIS — F4322 Adjustment disorder with anxiety: Secondary | ICD-10-CM | POA: Diagnosis not present

## 2017-03-31 ENCOUNTER — Encounter: Payer: Self-pay | Admitting: Podiatry

## 2017-03-31 ENCOUNTER — Ambulatory Visit (INDEPENDENT_AMBULATORY_CARE_PROVIDER_SITE_OTHER): Payer: 59 | Admitting: Podiatry

## 2017-03-31 VITALS — BP 115/65 | HR 54 | Temp 98.2°F | Resp 16

## 2017-03-31 DIAGNOSIS — D169 Benign neoplasm of bone and articular cartilage, unspecified: Secondary | ICD-10-CM | POA: Diagnosis not present

## 2017-03-31 NOTE — Progress Notes (Signed)
Subjective:    [name], female   DOB: 46 y.o.   MRN: [id number]   HPI patient presents to office for correction of spur right fifth digit that's painful    ROS      Objective:  Physical Exam neurovascular status intact with large spur formation right fifth toe lateral side that's painful when palpated     Assessment:    Chronic spur formation right fifth digit     Plan:     The area was anesthetized with 60 Milligan Xylocaine Marcaine mixture and the patient was brought to the operating room. Sterile prep applied to the right foot and the tourniquet was inflated to 250 mmHg and the following procedure was performed. Attention was directed to the dorsal lateral aspect right fifth digit where a semielliptical incision was made in the distal lateral side of the toe encompassing the lesion and taken down to bone. The skin wedge was removed in toto and at this time utilizing a sidecutting bur the spur was resected flush with all surfaces. It was then sutured with 5-0 nylon after copious flushing and sterile dressing was applied tourniquet was released and capillary fills noted immediate to all digits. Patient left the OR in satisfactory condition

## 2017-04-12 ENCOUNTER — Ambulatory Visit (INDEPENDENT_AMBULATORY_CARE_PROVIDER_SITE_OTHER): Payer: 59 | Admitting: Psychology

## 2017-04-12 DIAGNOSIS — F4322 Adjustment disorder with anxiety: Secondary | ICD-10-CM

## 2017-04-14 ENCOUNTER — Ambulatory Visit (INDEPENDENT_AMBULATORY_CARE_PROVIDER_SITE_OTHER): Payer: 59 | Admitting: Podiatry

## 2017-04-14 ENCOUNTER — Ambulatory Visit (INDEPENDENT_AMBULATORY_CARE_PROVIDER_SITE_OTHER): Payer: 59

## 2017-04-14 ENCOUNTER — Encounter: Payer: Self-pay | Admitting: Podiatry

## 2017-04-14 VITALS — BP 122/67 | HR 64 | Resp 16

## 2017-04-14 DIAGNOSIS — D169 Benign neoplasm of bone and articular cartilage, unspecified: Secondary | ICD-10-CM

## 2017-04-15 NOTE — Progress Notes (Signed)
Subjective:    [name], female   DOB: 46 y.o.   MRN: [id number]   HPI patient states she's doing great    ROS      Objective:  Physical Exam neurovascular status intact with patient's fifth digit right healing well with wound edges well coapted stitches in place     Assessment:   Doing well post exostectomy fifth digit right      Plan:    Stitches reviewed x-ray discussed and sterile dressing reapplied. Patient be seen back as needed and this should heal uneventfully  X-rays indicate satisfactory section of bone fifth digit right

## 2017-04-28 ENCOUNTER — Encounter: Payer: 59 | Admitting: Podiatry

## 2017-05-04 ENCOUNTER — Ambulatory Visit (INDEPENDENT_AMBULATORY_CARE_PROVIDER_SITE_OTHER): Payer: 59 | Admitting: Psychology

## 2017-05-04 DIAGNOSIS — F4322 Adjustment disorder with anxiety: Secondary | ICD-10-CM

## 2017-05-25 ENCOUNTER — Ambulatory Visit (INDEPENDENT_AMBULATORY_CARE_PROVIDER_SITE_OTHER): Payer: 59 | Admitting: Psychology

## 2017-05-25 DIAGNOSIS — F4322 Adjustment disorder with anxiety: Secondary | ICD-10-CM

## 2017-06-16 ENCOUNTER — Ambulatory Visit (INDEPENDENT_AMBULATORY_CARE_PROVIDER_SITE_OTHER): Payer: 59 | Admitting: Nurse Practitioner

## 2017-06-16 ENCOUNTER — Encounter: Payer: Self-pay | Admitting: Nurse Practitioner

## 2017-06-16 ENCOUNTER — Ambulatory Visit (HOSPITAL_BASED_OUTPATIENT_CLINIC_OR_DEPARTMENT_OTHER)
Admission: RE | Admit: 2017-06-16 | Discharge: 2017-06-16 | Disposition: A | Discharge status: Home / Self Care | Payer: 59 | Source: Ambulatory Visit | Attending: Nurse Practitioner | Admitting: Nurse Practitioner

## 2017-06-16 VITALS — BP 130/84 | HR 79 | Temp 97.7°F | Resp 18

## 2017-06-16 DIAGNOSIS — J45909 Unspecified asthma, uncomplicated: Secondary | ICD-10-CM

## 2017-06-16 DIAGNOSIS — R0602 Shortness of breath: Secondary | ICD-10-CM | POA: Insufficient documentation

## 2017-06-16 LAB — D-DIMER, QUANTITATIVE: D-Dimer, Quant: 0.49 mcg/mL FEU (ref <0.50)

## 2017-06-16 MED ORDER — ALBUTEROL SULFATE HFA 108 (90 BASE) MCG/ACT IN AERS: 2.0000 | INHALATION_SPRAY | Freq: Four times a day (QID) | RESPIRATORY_TRACT | 1 refills | Status: AC | PRN

## 2017-06-16 MED ORDER — ALBUTEROL SULFATE (2.5 MG/3ML) 0.083% IN NEBU
2.5000 mg | INHALATION_SOLUTION | Freq: Once | RESPIRATORY_TRACT | Status: AC
  Administered 2017-06-16: 2.5 mg via RESPIRATORY_TRACT

## 2017-06-16 NOTE — Progress Notes (Signed)
Subjective:    [name], female    DOB: [birth year], 46 y.o.   MRN: [id number]  HPI Ms. Ayars is a 46 year old female who presents today with shortness of breath. The shortness of breath began Saturday. She describes as a tightness in her throat, like she can't get a full breath in. The shortness of breath is worse with exertion.The shortness of breath became severe today after she walked up stairs to her clinic appointment. She believes the dry air in the clinic waiting room made her even more short of breath. The shortness of breath decreases with rest. She denies weakness, syncope, fevers, chest pain, palpitations, cough, edema. She reports infrequent use of her albuterol inhaler, uses only four-five times per year when exposed to an allergen or climate change. She hesitates to use her albutr because it makes her heart race. She is currently out of her albuterol inhaler at home.  Review of Systems See HPI  Past Medical History:  Diagnosis Date  . Allergic state 08/30/2012  . Anxiety state 07/01/2014  . Asthma    cats, dust, triggers  . Asthma   . Asthma exacerbation   . Atypical moles 03/03/2012  . Breast lump on right side at 10 o'clock position 03/21/2012  . Chicken pox as a child  . Gestational thrombocytopenia ([place]) 02/29/2012  . History of recurrent UTIs 07/28/2016  . Hyperglycemia 02/29/2012  . Hyperlipidemia 07/01/2013  . Migraine 06/26/2013  . Migraine 07/01/2013  . Mumps as a child  . Neck pain 07/01/2014  . Preventative health care 03/03/2012  . Skin lesion 07/01/2015   Halo lesion on back present since college Raised verrucous lesion right ankle for several years.  . Thrombocytopenia ([place]) 02/29/2012   Possible anemia/low iron? In past per patient   . Toe pain, right 02/16/2017  . UTI (lower urinary tract infection) 04/14/2012     Social History   Social History  . Marital status: Married    Spouse name: N/A  . Number of children: N/A  . Years of  education: N/A   Occupational History  . Not on file.   Social History Main Topics  . Smoking status: Never Smoker  . Smokeless tobacco: Never Used  . Alcohol use No  . Drug use: No  . Sexual activity: Yes    Partners: Male   Other Topics Concern  . Not on file   Social History Narrative  . No narrative on file    Past Surgical History:  Procedure Laterality Date  . [place], 2001,2003   X 3  . HAMMER TOE SURGERY  1992   right 5th toe  . TUBAL LIGATION      Family History  Problem Relation Age of Onset  . Thyroid disease Father        Hashimoto's  . Depression Sister        related to cycle  . Muscular dystrophy Son        Donnajean Lopes  . Other Son        Neurogenic bladder  . Asthma Son        mild  . Celiac disease Son   . Stroke Maternal Grandmother   . Heart attack Maternal Grandfather        ?  Marland Kitchen Heart disease Maternal Grandfather   . Diabetes Paternal Grandmother        type 2  . Hypertension Paternal Grandmother   . Obesity Paternal Grandmother   .  Other Sister        gluten intolerate or celiac, rectocele repair x 2 with bowel adhesions.  . Stroke Sister 23       s/p infections and surgeries  . Depression Sister   . Fibromyalgia Sister   . Thyroid disease Sister   . Cholelithiasis Sister   . Celiac disease Son   . Kidney disease Maternal Uncle   . Diabetes Maternal Uncle   . Diabetes Mother        2, diet controlled    Allergies  Allergen Reactions  . Molds & Smuts     COUGH, TIGHTNESS IN CHEST  . Other     CAT DANDER= tightness in chest, watery eyes, cough    Current Outpatient Prescriptions on File Prior to Visit  Medication Sig Dispense Refill  . albuterol (PROVENTIL HFA;VENTOLIN HFA) 108 (90 Base) MCG/ACT inhaler Inhale 2 puffs into the lungs every 6 (six) hours as needed for wheezing. 1 Inhaler 1   No current facility-administered medications on file prior to visit.     BP 130/84 (BP Location: Right Arm, Cuff  Size: Normal)   Pulse 79   Temp 97.7 F (36.5 C) (Oral)   Resp 18   LMP 05/27/2017   SpO2 99%      Objective:   Physical Exam  Constitutional: She is oriented to person, place, and time. She appears well-developed and well-nourished.  Cardiovascular: Normal rate, regular rhythm, normal heart sounds and intact distal pulses.   No murmur heard. Pulmonary/Chest: Tachypneic in respiratory distress on arrival to clinic. Breathing improved after rest and nebulizer. No distress at completion of visit. Breath sounds normal. She has no wheezes, rhonchi, rales.  Neurological: She is alert and oriented to person, place, and time.  Skin: Skin is warm and dry.      Assessment & Plan:  Shortness of breath EKG completed and albuterol nebulizer given in clinic. I personally reviewed the EKG. NSR noted, no acute abnormalites. The patient is feeling some improvement in her breathing after the nebulizer treatment. STAT D-dimer and CXR ordered. Albuterol prescription refilled. Pt educated on appropriate use and normal side effects of albuterol. Instructed patient to RTC in 1 week for follow up or go to ER sooner for worsening symptoms.

## 2017-06-16 NOTE — Patient Instructions (Addendum)
Please stop by the lab to have blood drawn and go downstairs for an x-ray of your chest.  We will contact you later today with your test results. Please keep your cell phone on so that we can reach you.  I would like for you to follow-up with your PCP in 1 week for a re-check. If you become more short of breath or develop chest pain or worsening symptoms please go to the nearest ER.

## 2017-06-17 ENCOUNTER — Encounter: Payer: Self-pay | Admitting: Medical

## 2017-06-17 ENCOUNTER — Telehealth: Payer: Self-pay | Admitting: *Deleted

## 2017-06-17 ENCOUNTER — Ambulatory Visit (INDEPENDENT_AMBULATORY_CARE_PROVIDER_SITE_OTHER): Payer: 59 | Admitting: Medical

## 2017-06-17 VITALS — BP 110/82 | HR 85 | Temp 98.2°F | Wt 126.0 lb

## 2017-06-17 DIAGNOSIS — R062 Wheezing: Secondary | ICD-10-CM

## 2017-06-17 DIAGNOSIS — R0600 Dyspnea, unspecified: Secondary | ICD-10-CM

## 2017-06-17 MED ORDER — PREDNISONE 10 MG PO TABS: ORAL_TABLET | ORAL | 0 refills | Status: DC

## 2017-06-17 MED ORDER — LEVOCETIRIZINE DIHYDROCHLORIDE 5 MG PO TABS: 5.0000 mg | ORAL_TABLET | Freq: Every evening | ORAL | 2 refills | Status: DC

## 2017-06-17 MED ORDER — BENZONATATE 100 MG PO CAPS: 100.0000 mg | ORAL_CAPSULE | Freq: Three times a day (TID) | ORAL | 0 refills | Status: DC | PRN

## 2017-06-17 MED ORDER — BECLOMETHASONE DIPROPIONATE 40 MCG/ACT IN AERS: 2.0000 | INHALATION_SPRAY | Freq: Two times a day (BID) | RESPIRATORY_TRACT | 12 refills | Status: DC

## 2017-06-17 MED ORDER — METHYLPREDNISOLONE ACETATE 80 MG/ML IJ SUSP
80.0000 mg | Freq: Once | INTRAMUSCULAR | Status: AC
  Administered 2017-06-17: 80 mg via INTRAMUSCULAR

## 2017-06-17 NOTE — Telephone Encounter (Signed)
Received results from [facility]; forwarded to provider/SLS 09/27

## 2017-06-17 NOTE — Patient Instructions (Addendum)
For your recent wheezing, shortness of breath and nasal congestion, we gave you Depo-Medrol 80 mg IM tonight. Also I want you to start this 6 day taper dose of prednisone tonight.  Continue to use albuterol every 4-6 hours as needed.   Also prescribing Qvar steroid inhaler.  You had negative workup yesterday. EKG was normal, d-dimer was negative and chest x-ray was negative.  Workup is very limited presently in our clinic because  lab being closed.   If you worsen despite the above treatment would recommend emergency department evaluation tonight. Also I would like for you to call our office and give me an update tomorrow morning. I would like to know that you have improved some. Even tomorrow I might recommend emergency department evaluation if you have not improved significantly daily as we are approaching the weekend. Also getting you in to see a pulmonologist in the near future might be extremely challenging.   For allergy component to recent illness I am prescribing Xyzal. For cough prescription of benzonatate.  Follow-up this coming Monday or as needed.

## 2017-06-17 NOTE — Progress Notes (Signed)
Subjective:    [name], female    DOB: [birth year], 46 y.o.   MRN: [id number]  HPI  Pt in for follow up from yesterday. Pt states just seen yesterday by NP. She is not better.   Pt had cxr, ekg and d-dimer were normal.(I reviewed these studies)  Pt today has some nasal congestion today. She states her wheezing and shortness of breath not improving.  Pt thinks she has allergies that has caused wheezing.  She has no pain in her legs or any swelling. No swelling reported.  In past pt states asthma flares would occur with allergies, exposure to cats or illness.   She is not diabetic.  She denies any chest pain. More like can't take a full deep breath. No family history of heart disease at young age. Pt does don't smoke.  Pt states that maybe one time in past may have had to use steroid for wheezing. But she can't remember when.  lmp-05-27-2017.     Review of Systems  Constitutional: Negative for chills, diaphoresis and fatigue.  HENT: Positive for congestion. Negative for ear pain, hearing loss, nosebleeds, postnasal drip, rhinorrhea and sinus pain.   Respiratory: Positive for cough, shortness of breath and wheezing.   Cardiovascular: Negative for chest pain and palpitations.  Gastrointestinal: Negative for abdominal distention, abdominal pain, nausea and vomiting.  Musculoskeletal: Negative for back pain.  Skin: Negative for rash.  Neurological: Negative for dizziness, seizures, weakness and headaches.  Hematological: Negative for adenopathy. Does not bruise/bleed easily.  Psychiatric/Behavioral: Negative for behavioral problems and confusion.   Past Medical History:  Diagnosis Date  . Allergic state 08/30/2012  . Anxiety state 07/01/2014  . Asthma    cats, dust, triggers  . Asthma   . Asthma exacerbation   . Atypical moles 03/03/2012  . Breast lump on right side at 10 o'clock position 03/21/2012  . Chicken pox as a child  . Gestational thrombocytopenia  ([place]) 02/29/2012  . History of recurrent UTIs 07/28/2016  . Hyperglycemia 02/29/2012  . Hyperlipidemia 07/01/2013  . Migraine 06/26/2013  . Migraine 07/01/2013  . Mumps as a child  . Neck pain 07/01/2014  . Preventative health care 03/03/2012  . Skin lesion 07/01/2015   Halo lesion on back present since college Raised verrucous lesion right ankle for several years.  . Thrombocytopenia ([place]) 02/29/2012   Possible anemia/low iron? In past per patient   . Toe pain, right 02/16/2017  . UTI (lower urinary tract infection) 04/14/2012     Social History   Social History  . Marital status: Married    Spouse name: N/A  . Number of children: N/A  . Years of education: N/A   Occupational History  . Not on file.   Social History Main Topics  . Smoking status: Never Smoker  . Smokeless tobacco: Never Used  . Alcohol use No  . Drug use: No  . Sexual activity: Yes    Partners: Male   Other Topics Concern  . Not on file   Social History Narrative  . No narrative on file    Past Surgical History:  Procedure Laterality Date  . [place], 2001,2003   X 3  . HAMMER TOE SURGERY  1992   right 5th toe  . TUBAL LIGATION      Family History  Problem Relation Age of Onset  . Thyroid disease Father        Hashimoto's  . Depression Sister  related to cycle  . Muscular dystrophy Son        Donnajean Lopes  . Other Son        Neurogenic bladder  . Asthma Son        mild  . Celiac disease Son   . Stroke Maternal Grandmother   . Heart attack Maternal Grandfather        ?  Marland Kitchen Heart disease Maternal Grandfather   . Diabetes Paternal Grandmother        type 2  . Hypertension Paternal Grandmother   . Obesity Paternal Grandmother   . Other Sister        gluten intolerate or celiac, rectocele repair x 2 with bowel adhesions.  . Stroke Sister 50       s/p infections and surgeries  . Depression Sister   . Fibromyalgia Sister   . Thyroid disease Sister   .  Cholelithiasis Sister   . Celiac disease Son   . Kidney disease Maternal Uncle   . Diabetes Maternal Uncle   . Diabetes Mother        2, diet controlled    Allergies  Allergen Reactions  . Molds & Smuts     COUGH, TIGHTNESS IN CHEST  . Other     CAT DANDER= tightness in chest, watery eyes, cough    Current Outpatient Prescriptions on File Prior to Visit  Medication Sig Dispense Refill  . albuterol (PROVENTIL HFA;VENTOLIN HFA) 108 (90 Base) MCG/ACT inhaler Inhale 2 puffs into the lungs every 6 (six) hours as needed for wheezing. 1 Inhaler 1   No current facility-administered medications on file prior to visit.     BP 110/82   Pulse 100   Temp 98.2 F (36.8 C) (Oral)   Wt 126 lb (57.2 kg)   LMP 05/27/2017   SpO2 99%   BMI 22.32 kg/m       Objective:   Physical Exam  General  Mental Status - Alert. General Appearance - Well groomed. Not in acute distress.Sounds nasal congested.  Skin Rashes- No Rashes.  HEENT Head- Normal. Ear Auditory Canal - Left- Normal. Right - Normal.Tympanic Membrane- Left- Normal. Right- Normal. Eye Sclera/Conjunctiva- Left- Normal. Right- Normal. Nose & Sinuses Nasal Mucosa- Left-  Boggy and Congested. Right-  Boggy and  Congested.Bilateral no maxillary and no frontal sinus pressure. Mouth & Throat Lips: Upper Lip- Normal: no dryness, cracking, pallor, cyanosis, or vesicular eruption. Lower Lip-Normal: no dryness, cracking, pallor, cyanosis or vesicular eruption. Buccal Mucosa- Bilateral- No Aphthous ulcers. Oropharynx- No Discharge or Erythema. Positive postnasal drainage Tonsils: Characteristics- Bilateral- No Erythema or Congestion. Size/Enlargement- Bilateral- No enlargement. Discharge- bilateral-None.  Neck Neck- Supple. No Masses. No JVD   Chest and Lung Exam Auscultation: Breath Sounds:-even and unlabored.(But coughs intermittently throughout the interview and appeared mildly short of breath.) Asked patient to walk down the  hall with me and her pulse varied between 85 and 98. Most of time in the 80s then increased at into 98. Pulse ox remained stable at 98% the entire time. She did not see with labored breathing on the walk.  Cardiovascular Auscultation:Rythm- Regular, rate and rhythm. Murmurs & Other Heart Sounds:Ausculatation of the heart reveal- No Murmurs.  Lymphatic Head & Neck General Head & Neck Lymphatics: Bilateral: Description- No Localized lymphadenopathy.  Lower ext- negative Homans sign bilaterally. No pedal edema.      Assessment & Plan:  .For your recent wheezing, shortness of breath and nasal congestion, we gave you Depo-Medrol 80 mg  IM tonight. Also I want you to start this 6 day taper dose of prednisone tonight.  Continue to use albuterol every 4-6 hours as needed.   Also prescribing Qvar steroid inhaler.  You had negative workup yesterday. EKG was normal, d-dimer was negative and chest x-ray was negative.  Workup is very limited presently in our clinic because  lab being closed.   If you worsen despite the above treatment would recommend emergency department evaluation tonight. Also I would like for you to call our office and give me an update tomorrow morning. I would like to know that you have improved some. Even tomorrow I might recommend emergency department evaluation if you have not improved significantly daily as we are approaching the weekend. Also getting you in to see a pulmonologist in the near future might be extremely challenging.   For allergy component to recent illness I am prescribing Xyzal. For cough prescription of benzonatate.  Follow-up this coming Monday or as needed.   Elisa Kutner, Percell Miller, PA-C

## 2017-06-18 ENCOUNTER — Telehealth: Payer: Self-pay | Admitting: Medical

## 2017-06-18 ENCOUNTER — Telehealth: Payer: Self-pay | Admitting: Nurse Practitioner

## 2017-06-18 ENCOUNTER — Telehealth: Payer: Self-pay

## 2017-06-18 MED ORDER — BECLOMETHASONE DIPROP HFA 40 MCG/ACT IN AERB: 2.0000 | INHALATION_SPRAY | Freq: Two times a day (BID) | RESPIRATORY_TRACT | 12 refills | Status: DC

## 2017-06-18 NOTE — Telephone Encounter (Signed)
Sent Qvar redihaler to pharmacy. Qvar is no longer available.

## 2017-06-18 NOTE — Telephone Encounter (Signed)
Pt called to let April Bridges know she is feeling much better after starting steroid last night.

## 2017-06-18 NOTE — Telephone Encounter (Signed)
I reviewed negative chest x-ray and d-dimer with the patient and she will follow up as instructed.

## 2017-06-21 ENCOUNTER — Encounter: Payer: Self-pay | Admitting: Medical

## 2017-06-21 ENCOUNTER — Ambulatory Visit (INDEPENDENT_AMBULATORY_CARE_PROVIDER_SITE_OTHER): Payer: 59 | Admitting: Medical

## 2017-06-21 VITALS — BP 120/70 | HR 67 | Temp 98.3°F | Ht 63.0 in | Wt 128.2 lb

## 2017-06-21 DIAGNOSIS — J45909 Unspecified asthma, uncomplicated: Secondary | ICD-10-CM | POA: Diagnosis not present

## 2017-06-21 DIAGNOSIS — J3089 Other allergic rhinitis: Secondary | ICD-10-CM | POA: Diagnosis not present

## 2017-06-21 MED ORDER — FLUTICASONE PROPIONATE HFA 110 MCG/ACT IN AERO: 2.0000 | INHALATION_SPRAY | Freq: Two times a day (BID) | RESPIRATORY_TRACT | 2 refills | Status: DC

## 2017-06-21 NOTE — Patient Instructions (Addendum)
You are much better today from probable asthma exacerbation. The steroid taper dose and Depo-Medrol injection seen to have helped a lot. You could not fill Qvar due to insurance issues. So I will prescribe Flovent. Use Flovent daily for at least a month. Continue to use albuterol if needed. Anticipate by 2 weeks or sooner that you will not need any more daily albuterol. But if so then would recommend referral to pulmonologist.  I do think that allergies may have precipitated/proceeded the asthma flare. Would recommend daily Xyzal throughout the fall. You could also add over-the-counter Flonase nasal spray if you notice nasal congestion component.  Follow-up with PCP as regularly scheduled or as needed.

## 2017-06-21 NOTE — Progress Notes (Signed)
Subjective:    [name], female    DOB: 1970/11/26, 46 y.o.   MRN: [id number]  HPI  Pt in states she overall feels a lot better. She state feels mild shortness but no severe level of shortness of breath ss on Thursday night.(see last visit)  Pt given depo medrol injection and tapered steroid regimen. Pt had some albuterol inhaler at home. Pt q-var was not covered.   Pt cough is a lot better as well.  Pt  thought maybe recennt allergies may have preceded shortness of breath. I prescribed her xyzal.  No fever, no chills, no sweating.     Review of Systems  Constitutional: Negative for chills, fatigue and fever.  HENT: Negative for congestion, drooling, ear pain, mouth sores, rhinorrhea, sinus pain and sinus pressure.   Respiratory: Negative for cough, chest tightness, shortness of breath and wheezing.        Better cough and much improved sob now.  Cardiovascular: Negative for chest pain and palpitations.  Gastrointestinal: Negative for abdominal pain, blood in stool, diarrhea, nausea and vomiting.  Musculoskeletal: Negative for back pain, joint swelling and myalgias.  Skin: Negative for rash.  Neurological: Negative for dizziness and headaches.  Hematological: Negative for adenopathy. Does not bruise/bleed easily.  Psychiatric/Behavioral: Negative for behavioral problems and confusion.   Past Medical History:  Diagnosis Date  . Allergic state 08/30/2012  . Anxiety state 07/01/2014  . Asthma    cats, dust, triggers  . Asthma   . Asthma exacerbation   . Atypical moles 03/03/2012  . Breast lump on right side at 10 o'clock position 03/21/2012  . Chicken pox as a child  . Gestational thrombocytopenia ([place]) 02/29/2012  . History of recurrent UTIs 07/28/2016  . Hyperglycemia 02/29/2012  . Hyperlipidemia 07/01/2013  . Migraine 06/26/2013  . Migraine 07/01/2013  . Mumps as a child  . Neck pain 07/01/2014  . Preventative health care 03/03/2012  . Skin lesion  07/01/2015   Halo lesion on back present since college Raised verrucous lesion right ankle for several years.  . Thrombocytopenia ([place]) 02/29/2012   Possible anemia/low iron? In past per patient   . Toe pain, right 02/16/2017  . UTI (lower urinary tract infection) 04/14/2012     Social History   Social History  . Marital status: Married    Spouse name: N/A  . Number of children: N/A  . Years of education: N/A   Occupational History  . Not on file.   Social History Main Topics  . Smoking status: Never Smoker  . Smokeless tobacco: Never Used  . Alcohol use No  . Drug use: No  . Sexual activity: Yes    Partners: Male   Other Topics Concern  . Not on file   Social History Narrative  . No narrative on file    Past Surgical History:  Procedure Laterality Date  . [place], 2001,2003   X 3  . HAMMER TOE SURGERY  1992   right 5th toe  . TUBAL LIGATION      Family History  Problem Relation Age of Onset  . Thyroid disease Father        Hashimoto's  . Depression Sister        related to cycle  . Muscular dystrophy Son        Donnajean Lopes  . Other Son        Neurogenic bladder  . Asthma Son  mild  . Celiac disease Son   . Stroke Maternal Grandmother   . Heart attack Maternal Grandfather        ?  Marland Kitchen Heart disease Maternal Grandfather   . Diabetes Paternal Grandmother        type 2  . Hypertension Paternal Grandmother   . Obesity Paternal Grandmother   . Other Sister        gluten intolerate or celiac, rectocele repair x 2 with bowel adhesions.  . Stroke Sister 46       s/p infections and surgeries  . Depression Sister   . Fibromyalgia Sister   . Thyroid disease Sister   . Cholelithiasis Sister   . Celiac disease Son   . Kidney disease Maternal Uncle   . Diabetes Maternal Uncle   . Diabetes Mother        2, diet controlled    Allergies  Allergen Reactions  . Molds & Smuts     COUGH, TIGHTNESS IN CHEST  . Other     CAT DANDER=  tightness in chest, watery eyes, cough    Current Outpatient Prescriptions on File Prior to Visit  Medication Sig Dispense Refill  . albuterol (PROVENTIL HFA;VENTOLIN HFA) 108 (90 Base) MCG/ACT inhaler Inhale 2 puffs into the lungs every 6 (six) hours as needed for wheezing. 1 Inhaler 1  . benzonatate (TESSALON) 100 MG capsule Take 1 capsule (100 mg total) by mouth 3 (three) times daily as needed for cough. 20 capsule 0  . levocetirizine (XYZAL) 5 MG tablet Take 1 tablet (5 mg total) by mouth every evening. 30 tablet 2  . predniSONE (DELTASONE) 10 MG tablet 6 TAB PO DAY 1 5 TAB PO DAY 2 4 TAB PO DAY 3 3 TAB PO DAY 4 2 TAB PO DAY 5 1 TAB PO DAY 6 21 tablet 0   No current facility-administered medications on file prior to visit.     BP (!) 80/55 (BP Location: Right Arm, Patient Position: Sitting, Cuff Size: Normal)   Pulse 67   Temp 98.3 F (36.8 C) (Oral)   Ht 5\' 3"  (1.6 m)   Wt 128 lb 3.2 oz (58.2 kg)   LMP 05/27/2017   SpO2 100%   BMI 22.71 kg/m       Objective:   Physical Exam   General  Mental Status - Alert. General Appearance - Well groomed. Not in acute distress.  Skin Rashes- No Rashes.  HEENT Head- Normal. Ear Auditory Canal - Left- Normal. Right - Normal.Tympanic Membrane- Left- Normal. Right- Normal. Eye Sclera/Conjunctiva- Left- Normal. Right- Normal. Nose & Sinuses Nasal Mucosa- Left-  mild Boggy and Congested. Right- mild Boggy and  Congested.Bilateral  No maxillary and no  frontal sinus pressure.  Mouth & Throat Lips: Upper Lip- Normal: no dryness, cracking, pallor, cyanosis, or vesicular eruption. Lower Lip-Normal: no dryness, cracking, pallor, cyanosis or vesicular eruption. Buccal Mucosa- Bilateral- No Aphthous ulcers. Oropharynx- No Discharge or Erythema. Tonsils: Characteristics- Bilateral- No Erythema or Congestion. Size/Enlargement- Bilateral- No enlargement. Discharge- bilateral-None.  Neck Neck- Supple. No Masses.   Chest and Lung  Exam Auscultation: Breath Sounds:-Clear even and unlabored.  Cardiovascular Auscultation:Rythm- Regular, rate and rhythm. Murmurs & Other Heart Sounds:Ausculatation of the heart reveal- No Murmurs.  Lymphatic Head & Neck General Head & Neck Lymphatics: Bilateral: Description- No Localized lymphadenopathy.     Assessment & Plan:  You are much better today from probable asthma exacerbation. The steroid taper dose and Depo-Medrol injection seen to have helped a  lot. You could not fill Qvar due to insurance issues. So I will prescribe Flovent. Use Flovent daily for at least a month. Continue to use albuterol if needed. Anticipate by 2 weeks or sooner that she will need any more daily albuterol. But if so then would recommend referral to pulmonologist.  I do think that allergies may have precipitated/proceeded th asthma flare. Would recommend daily Xyzal throughout the fall. You could also add over-the-counter Flonase nasal spray if you notice nasal congestion component.  Follow-up with PCP as regularly scheduled or as needed.  [place], Percell Miller, PA-C

## 2017-06-25 ENCOUNTER — Ambulatory Visit (INDEPENDENT_AMBULATORY_CARE_PROVIDER_SITE_OTHER): Payer: 59 | Admitting: Family Medicine

## 2017-06-25 ENCOUNTER — Encounter: Payer: Self-pay | Admitting: Family Medicine

## 2017-06-25 VITALS — BP 100/70 | HR 80 | Temp 98.0°F | Ht 63.0 in | Wt 128.0 lb

## 2017-06-25 DIAGNOSIS — R739 Hyperglycemia, unspecified: Secondary | ICD-10-CM | POA: Diagnosis not present

## 2017-06-25 DIAGNOSIS — J209 Acute bronchitis, unspecified: Secondary | ICD-10-CM

## 2017-06-25 DIAGNOSIS — T7840XS Allergy, unspecified, sequela: Secondary | ICD-10-CM | POA: Diagnosis not present

## 2017-06-25 DIAGNOSIS — J45909 Unspecified asthma, uncomplicated: Secondary | ICD-10-CM

## 2017-06-25 MED ORDER — METHYLPREDNISOLONE 4 MG PO TABS: ORAL_TABLET | ORAL | 0 refills | Status: DC

## 2017-06-25 MED ORDER — MONTELUKAST SODIUM 10 MG PO TABS: 10.0000 mg | ORAL_TABLET | Freq: Every evening | ORAL | 2 refills | Status: DC | PRN

## 2017-06-25 MED ORDER — METHYLPREDNISOLONE 4 MG PO TABS: ORAL_TABLET | ORAL | 1 refills | Status: DC

## 2017-06-25 MED ORDER — FLUTICASONE PROPIONATE 50 MCG/ACT NA SUSP: 2.0000 | Freq: Every day | NASAL | 2 refills | Status: DC | PRN

## 2017-06-25 NOTE — Progress Notes (Signed)
Subjective:  I acted as a Education administrator for Dr. Rogue Jury, CMA   [name], female    DOB: 02-08-1971, 46 y.o.   MRN: [id number]  Chief Complaint  Patient presents with  . Follow-up    Feels chest pressure and has a slight cough. Wants discuss with Dr. Charlett Blake . Pt had EKG and D dimer during last office visit. No abnormal results  . Breathing Problem    States the air in the office feels heavy compared to others places.     HPI  Patient is in today for cough and congestion. She was recently treated for allergic response and asthma exacerbation with Xyzal and Prednisone. She improved some but then her symptoms returned. She feels tight in her chest, has a cough and congestion. Notes malaise. No recent fever or chill. Denies CP/palp/HA/congestion/fevers/GI or GU c/o. Taking meds as prescribed  Patient Care Team: Mosie Lukes, MD as PCP - General (Family Medicine)   Past Medical History:  Diagnosis Date  . Acute bronchitis 06/26/2017  . Allergic state 08/30/2012  . Anxiety state 07/01/2014  . Asthma    cats, dust, triggers  . Asthma   . Asthma exacerbation   . Atypical moles 03/03/2012  . Breast lump on right side at 10 o'clock position 03/21/2012  . Chicken pox as a child  . Gestational thrombocytopenia ([place]) 02/29/2012  . History of recurrent UTIs 07/28/2016  . Hyperglycemia 02/29/2012  . Hyperlipidemia 07/01/2013  . Migraine 06/26/2013  . Migraine 07/01/2013  . Mumps as a child  . Neck pain 07/01/2014  . Preventative health care 03/03/2012  . Skin lesion 07/01/2015   Halo lesion on back present since college Raised verrucous lesion right ankle for several years.  . Thrombocytopenia ([place]) 02/29/2012   Possible anemia/low iron? In past per patient   . Toe pain, right 02/16/2017  . UTI (lower urinary tract infection) 04/14/2012    Past Surgical History:  Procedure Laterality Date  . [place], 2001,2003   X 3  . HAMMER TOE SURGERY  1992   right 5th toe   . TUBAL LIGATION      Family History  Problem Relation Age of Onset  . Thyroid disease Father        Hashimoto's  . Depression Sister        related to cycle  . Muscular dystrophy Son        Donnajean Lopes  . Other Son        Neurogenic bladder  . Asthma Son        mild  . Celiac disease Son   . Stroke Maternal Grandmother   . Heart attack Maternal Grandfather        ?  Marland Kitchen Heart disease Maternal Grandfather   . Diabetes Paternal Grandmother        type 2  . Hypertension Paternal Grandmother   . Obesity Paternal Grandmother   . Other Sister        gluten intolerate or celiac, rectocele repair x 2 with bowel adhesions.  . Stroke Sister 23       s/p infections and surgeries  . Depression Sister   . Fibromyalgia Sister   . Thyroid disease Sister   . Cholelithiasis Sister   . Celiac disease Son   . Kidney disease Maternal Uncle   . Diabetes Maternal Uncle   . Diabetes Mother        2, diet controlled    Social  History   Social History  . Marital status: Married    Spouse name: N/A  . Number of children: N/A  . Years of education: N/A   Occupational History  . Not on file.   Social History Main Topics  . Smoking status: Never Smoker  . Smokeless tobacco: Never Used  . Alcohol use No  . Drug use: No  . Sexual activity: Yes    Partners: Male   Other Topics Concern  . Not on file   Social History Narrative  . No narrative on file    Outpatient Medications Prior to Visit  Medication Sig Dispense Refill  . albuterol (PROVENTIL HFA;VENTOLIN HFA) 108 (90 Base) MCG/ACT inhaler Inhale 2 puffs into the lungs every 6 (six) hours as needed for wheezing. 1 Inhaler 1  . fluticasone (FLOVENT HFA) 110 MCG/ACT inhaler Inhale 2 puffs into the lungs 2 (two) times daily. 1 Inhaler 2  . levocetirizine (XYZAL) 5 MG tablet Take 1 tablet (5 mg total) by mouth every evening. 30 tablet 2  . benzonatate (TESSALON) 100 MG capsule Take 1 capsule (100 mg total) by mouth 3 (three)  times daily as needed for cough. 20 capsule 0  . predniSONE (DELTASONE) 10 MG tablet 6 TAB PO DAY 1 5 TAB PO DAY 2 4 TAB PO DAY 3 3 TAB PO DAY 4 2 TAB PO DAY 5 1 TAB PO DAY 6 21 tablet 0   No facility-administered medications prior to visit.     Allergies  Allergen Reactions  . Molds & Smuts     COUGH, TIGHTNESS IN CHEST  . Other     CAT DANDER= tightness in chest, watery eyes, cough    Review of Systems  Constitutional: Positive for malaise/fatigue. Negative for fever.  HENT: Positive for congestion.   Eyes: Negative for blurred vision.  Respiratory: Positive for cough and shortness of breath.   Cardiovascular: Negative for chest pain, palpitations and leg swelling.  Gastrointestinal: Negative for abdominal pain, blood in stool and nausea.  Genitourinary: Negative for dysuria and frequency.  Musculoskeletal: Negative for falls.  Skin: Negative for rash.  Neurological: Negative for dizziness, loss of consciousness and headaches.  Endo/Heme/Allergies: Negative for environmental allergies.  Psychiatric/Behavioral: Negative for depression. The patient is not nervous/anxious.        Objective:    Physical Exam  Constitutional: She is oriented to person, place, and time. She appears well-developed and well-nourished. No distress.  HENT:  Head: Normocephalic and atraumatic.  Nose: Nose normal.  Eyes: Right eye exhibits no discharge. Left eye exhibits no discharge.  Neck: Normal range of motion. Neck supple.  Cardiovascular: Normal rate and regular rhythm.   No murmur heard. Pulmonary/Chest: Effort normal and breath sounds normal.  Abdominal: Soft. Bowel sounds are normal. There is no tenderness.  Musculoskeletal: She exhibits no edema.  Neurological: She is alert and oriented to person, place, and time.  Skin: Skin is warm and dry.  Psychiatric: She has a normal mood and affect.  Nursing note and vitals reviewed.   BP 100/70   Pulse 80   Temp 98 F (36.7 C) (Oral)    Ht 5\' 3"  (1.6 m)   Wt 128 lb (58.1 kg)   LMP 05/27/2017   SpO2 98%   BMI 22.67 kg/m  Wt Readings from Last 3 Encounters:  06/25/17 128 lb (58.1 kg)  06/21/17 128 lb 3.2 oz (58.2 kg)  06/17/17 126 lb (57.2 kg)   BP Readings from Last 3 Encounters:  06/25/17 100/70  06/21/17 120/70  06/17/17 110/82     Immunization History  Administered Date(s) Administered  . Influenza Split 06/16/2012  . Influenza,inj,Quad PF,6+ Mos 06/26/2013, 07/28/2016  . Td 09/21/2005  . Tdap 06/16/2012    Health Maintenance  Topic Date Due  . HIV Screening  12/28/1985  . INFLUENZA VACCINE  07/26/2017 (Originally 04/21/2017)  . PAP SMEAR  07/29/2019  . TETANUS/TDAP  06/16/2022    Lab Results  Component Value Date   WBC 5.2 07/28/2016   HGB 13.6 07/28/2016   HCT 40.8 07/28/2016   PLT 159.0 07/28/2016   GLUCOSE 85 07/28/2016   CHOL 140 07/28/2016   TRIG 71.0 07/28/2016   HDL 52.90 07/28/2016   LDLCALC 73 07/28/2016   ALT 11 07/28/2016   AST 16 07/28/2016   NA 138 07/28/2016   K 3.7 07/28/2016   CL 104 07/28/2016   CREATININE 0.81 07/28/2016   BUN 14 07/28/2016   CO2 29 07/28/2016   TSH 1.68 07/28/2016   HGBA1C 4.9 07/28/2016    Lab Results  Component Value Date   TSH 1.68 07/28/2016   Lab Results  Component Value Date   WBC 5.2 07/28/2016   HGB 13.6 07/28/2016   HCT 40.8 07/28/2016   MCV 91.0 07/28/2016   PLT 159.0 07/28/2016   Lab Results  Component Value Date   NA 138 07/28/2016   K 3.7 07/28/2016   CO2 29 07/28/2016   GLUCOSE 85 07/28/2016   BUN 14 07/28/2016   CREATININE 0.81 07/28/2016   BILITOT 0.7 07/28/2016   ALKPHOS 51 07/28/2016   AST 16 07/28/2016   ALT 11 07/28/2016   PROT 6.8 07/28/2016   ALBUMIN 4.3 07/28/2016   CALCIUM 9.5 07/28/2016   GFR 81.06 07/28/2016   Lab Results  Component Value Date   CHOL 140 07/28/2016   Lab Results  Component Value Date   HDL 52.90 07/28/2016   Lab Results  Component Value Date   LDLCALC 73 07/28/2016   Lab  Results  Component Value Date   TRIG 71.0 07/28/2016   Lab Results  Component Value Date   CHOLHDL 3 07/28/2016   Lab Results  Component Value Date   HGBA1C 4.9 07/28/2016         Assessment & Plan:   Problem List Items Addressed This Visit    Asthma - Primary   Relevant Medications   montelukast (SINGULAIR) 10 MG tablet   methylPREDNISolone (MEDROL) 4 MG tablet   Hyperglycemia    minimize simple carbs. Increase exercise as tolerated.       Allergic state    Zyrtec bid, Flonase daily, Singulair daily, nasal saline prn.       Acute bronchitis    Has been flared for past few weeks. Was treated with steroids and improved after she had a bad allergic response to working outdoors. Now her symptoms are worsening again. Will start Medrol dosepak an use Albuterol prn         I have discontinued Ms. Cuneo's predniSONE and benzonatate. I am also having her start on montelukast and fluticasone. Additionally, I am having her maintain her albuterol, levocetirizine, fluticasone, and methylPREDNISolone.  Meds ordered this encounter  Medications  . DISCONTD: methylPREDNISolone (MEDROL) 4 MG tablet    Sig: 5 tab po qd X 1d then 4 tab po qd X 1d then 3 tab po qd X 1d then 2 tab po qd then 1 tab po qd    Dispense:  15 tablet  Refill:  0  . montelukast (SINGULAIR) 10 MG tablet    Sig: Take 1 tablet (10 mg total) by mouth at bedtime as needed.    Dispense:  30 tablet    Refill:  2  . fluticasone (FLONASE) 50 MCG/ACT nasal spray    Sig: Place 2 sprays into both nostrils daily as needed for allergies or rhinitis.    Dispense:  16 g    Refill:  2  . methylPREDNISolone (MEDROL) 4 MG tablet    Sig: 5 tab po qd X 1d then 4 tab po qd X 1d then 3 tab po qd X 1d then 2 tab po qd then 1 tab po qd    Dispense:  15 tablet    Refill:  1    Penni Homans, MD CMA served as scribe during this visit. History, Physical and Plan performed by medical provider. Documentation and orders reviewed  and attested to.

## 2017-06-25 NOTE — Patient Instructions (Addendum)
Add Claritin at bedtime Flonase daily for next month then as needed Singulair/Montelukast daily x 1 month then as needed Nasal saline NOW [employer] at [email or web address] probiotic Asthma, Adult Asthma is a recurring condition in which the airways tighten and narrow. Asthma can make it difficult to breathe. It can cause coughing, wheezing, and shortness of breath. Asthma episodes, also called asthma attacks, range from minor to life-threatening. Asthma cannot be cured, but medicines and lifestyle changes can help control it. What are the causes? Asthma is believed to be caused by inherited (genetic) and environmental factors, but its exact cause is unknown. Asthma may be triggered by allergens, lung infections, or irritants in the air. Asthma triggers are different for each person. Common triggers include:  Animal dander.  Dust mites.  Cockroaches.  Pollen from trees or grass.  Mold.  Smoke.  Air pollutants such as dust, household cleaners, hair sprays, aerosol sprays, paint fumes, strong chemicals, or strong odors.  Cold air, weather changes, and winds (which increase molds and pollens in the air).  Strong emotional expressions such as crying or laughing hard.  Stress.  Certain medicines (such as aspirin) or types of drugs (such as beta-blockers).  Sulfites in foods and drinks. Foods and drinks that may contain sulfites include dried fruit, potato chips, and sparkling grape juice.  Infections or inflammatory conditions such as the flu, a cold, or an inflammation of the nasal membranes (rhinitis).  Gastroesophageal reflux disease (GERD).  Exercise or strenuous activity.  What are the signs or symptoms? Symptoms may occur immediately after asthma is triggered or many hours later. Symptoms include:  Wheezing.  Excessive nighttime or early morning coughing.  Frequent or severe coughing with a common cold.  Chest tightness.  Shortness of breath.  How is this  diagnosed? The diagnosis of asthma is made by a review of your medical history and a physical exam. Tests may also be performed. These may include:  Lung function studies. These tests show how much air you breathe in and out.  Allergy tests.  Imaging tests such as X-rays.  How is this treated? Asthma cannot be cured, but it can usually be controlled. Treatment involves identifying and avoiding your asthma triggers. It also involves medicines. There are 2 classes of medicine used for asthma treatment:  Controller medicines. These prevent asthma symptoms from occurring. They are usually taken every day.  Reliever or rescue medicines. These quickly relieve asthma symptoms. They are used as needed and provide short-term relief.  Your health care provider will help you create an asthma action plan. An asthma action plan is a written plan for managing and treating your asthma attacks. It includes a list of your asthma triggers and how they may be avoided. It also includes information on when medicines should be taken and when their dosage should be changed. An action plan may also involve the use of a device called a peak flow meter. A peak flow meter measures how well the lungs are working. It helps you monitor your condition. Follow these instructions at home:  Take medicines only as directed by your health care provider. Speak with your health care provider if you have questions about how or when to take the medicines.  Use a peak flow meter as directed by your health care provider. Record and keep track of readings.  Understand and use the action plan to help minimize or stop an asthma attack without needing to seek medical care.  Control your home environment in the following  ways to help prevent asthma attacks: ? Do not smoke. Avoid being exposed to secondhand smoke. ? Change your heating and air conditioning filter regularly. ? Limit your use of fireplaces and wood stoves. ? Get rid of  pests (such as roaches and mice) and their droppings. ? Throw away plants if you see mold on them. ? Clean your floors and dust regularly. Use unscented cleaning products. ? Try to have someone else vacuum for you regularly. Stay out of rooms while they are being vacuumed and for a short while afterward. If you vacuum, use a dust mask from a hardware store, a double-layered or microfilter vacuum cleaner bag, or a vacuum cleaner with a HEPA filter. ? Replace carpet with wood, tile, or vinyl flooring. Carpet can trap dander and dust. ? Use allergy-proof pillows, mattress covers, and box spring covers. ? Wash bed sheets and blankets every week in hot water and dry them in a dryer. ? Use blankets that are made of polyester or cotton. ? Clean bathrooms and kitchens with bleach. If possible, have someone repaint the walls in these rooms with mold-resistant paint. Keep out of the rooms that are being cleaned and painted. ? Wash hands frequently. Contact a health care provider if:  You have wheezing, shortness of breath, or a cough even if taking medicine to prevent attacks.  The colored mucus you cough up (sputum) is thicker than usual.  Your sputum changes from clear or white to yellow, green, gray, or bloody.  You have any problems that may be related to the medicines you are taking (such as a rash, itching, swelling, or trouble breathing).  You are using a reliever medicine more than 2-3 times per week.  Your peak flow is still at 50-79% of your personal best after following your action plan for 1 hour.  You have a fever. Get help right away if:  You seem to be getting worse and are unresponsive to treatment during an asthma attack.  You are short of breath even at rest.  You get short of breath when doing very little physical activity.  You have difficulty eating, drinking, or talking due to asthma symptoms.  You develop chest pain.  You develop a fast heartbeat.  You have a  bluish color to your lips or fingernails.  You are light-headed, dizzy, or faint.  Your peak flow is less than 50% of your personal best. This information is not intended to replace advice given to you by your health care provider. Make sure you discuss any questions you have with your health care provider. Document Released: 09/07/2005 Document Revised: 02/19/2016 Document Reviewed: 04/06/2013 Elsevier Interactive Patient Education  2017 [employer].

## 2017-06-26 ENCOUNTER — Encounter: Payer: Self-pay | Admitting: Family Medicine

## 2017-06-26 DIAGNOSIS — J209 Acute bronchitis, unspecified: Secondary | ICD-10-CM | POA: Insufficient documentation

## 2017-06-26 HISTORY — DX: Acute bronchitis, unspecified: J20.9

## 2017-06-26 NOTE — Assessment & Plan Note (Signed)
minimize simple carbs. Increase exercise as tolerated.  

## 2017-06-26 NOTE — Assessment & Plan Note (Signed)
Has been flared for past few weeks. Was treated with steroids and improved after she had a bad allergic response to working outdoors. Now her symptoms are worsening again. Will start Medrol dosepak an use Albuterol prn

## 2017-06-26 NOTE — Assessment & Plan Note (Signed)
Zyrtec bid, Flonase daily, Singulair daily, nasal saline prn.

## 2017-06-29 ENCOUNTER — Ambulatory Visit (INDEPENDENT_AMBULATORY_CARE_PROVIDER_SITE_OTHER): Payer: 59 | Admitting: Psychology

## 2017-06-29 DIAGNOSIS — F4323 Adjustment disorder with mixed anxiety and depressed mood: Secondary | ICD-10-CM | POA: Diagnosis not present

## 2017-07-30 ENCOUNTER — Encounter: Payer: Self-pay | Admitting: Family Medicine

## 2017-08-01 IMAGING — MG MM DIGITAL SCREENING BILATERAL
4 series · 4 of 4 positions shown · non-contrast
Comparison: Previous exam(s).

CLINICAL DATA: Screening.

EXAM:
DIGITAL SCREENING BILATERAL MAMMOGRAM WITH CAD

[L CC]
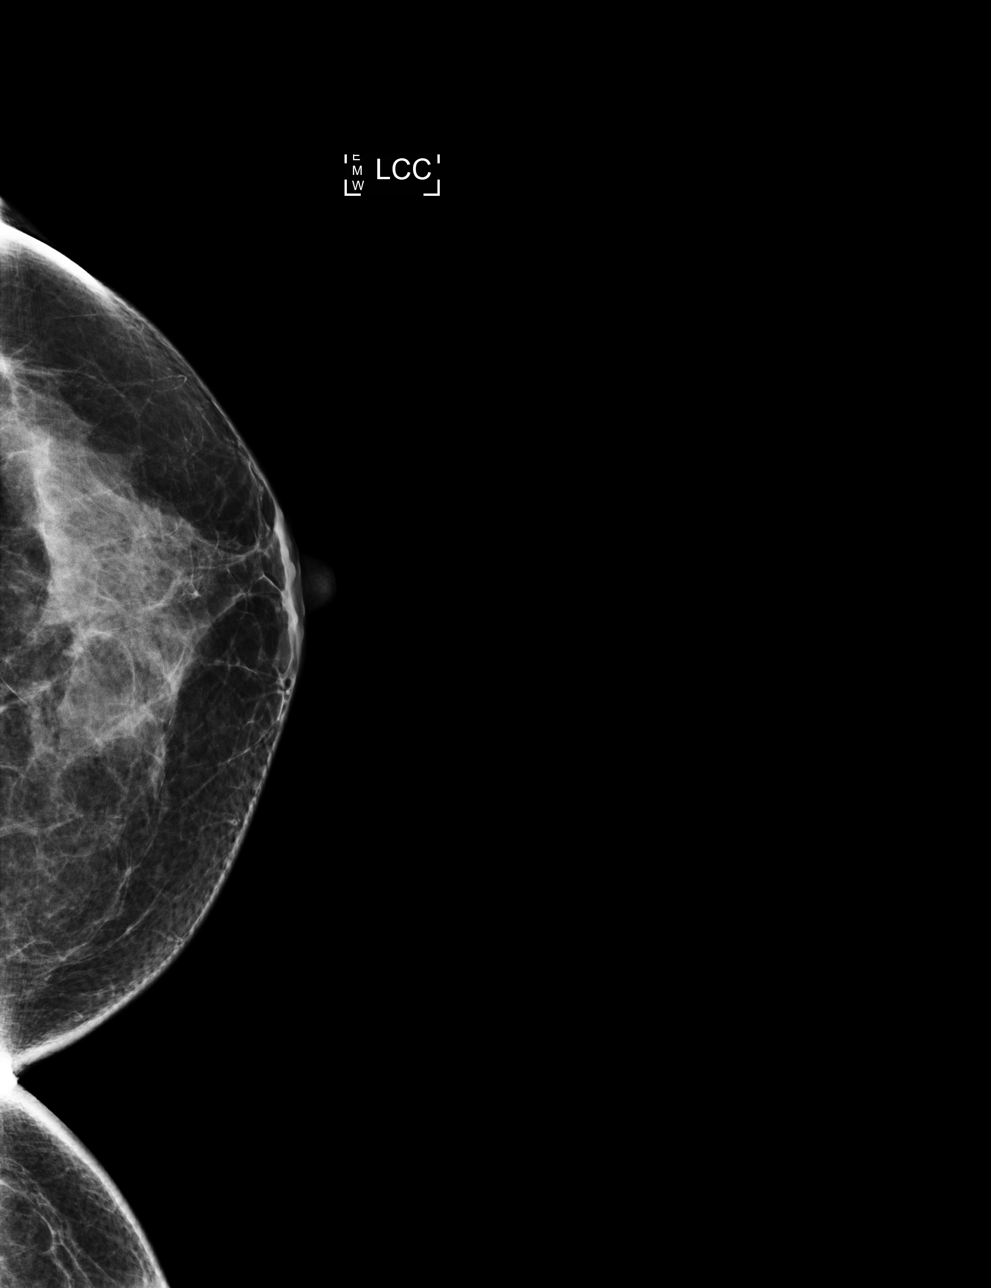

[R CC]
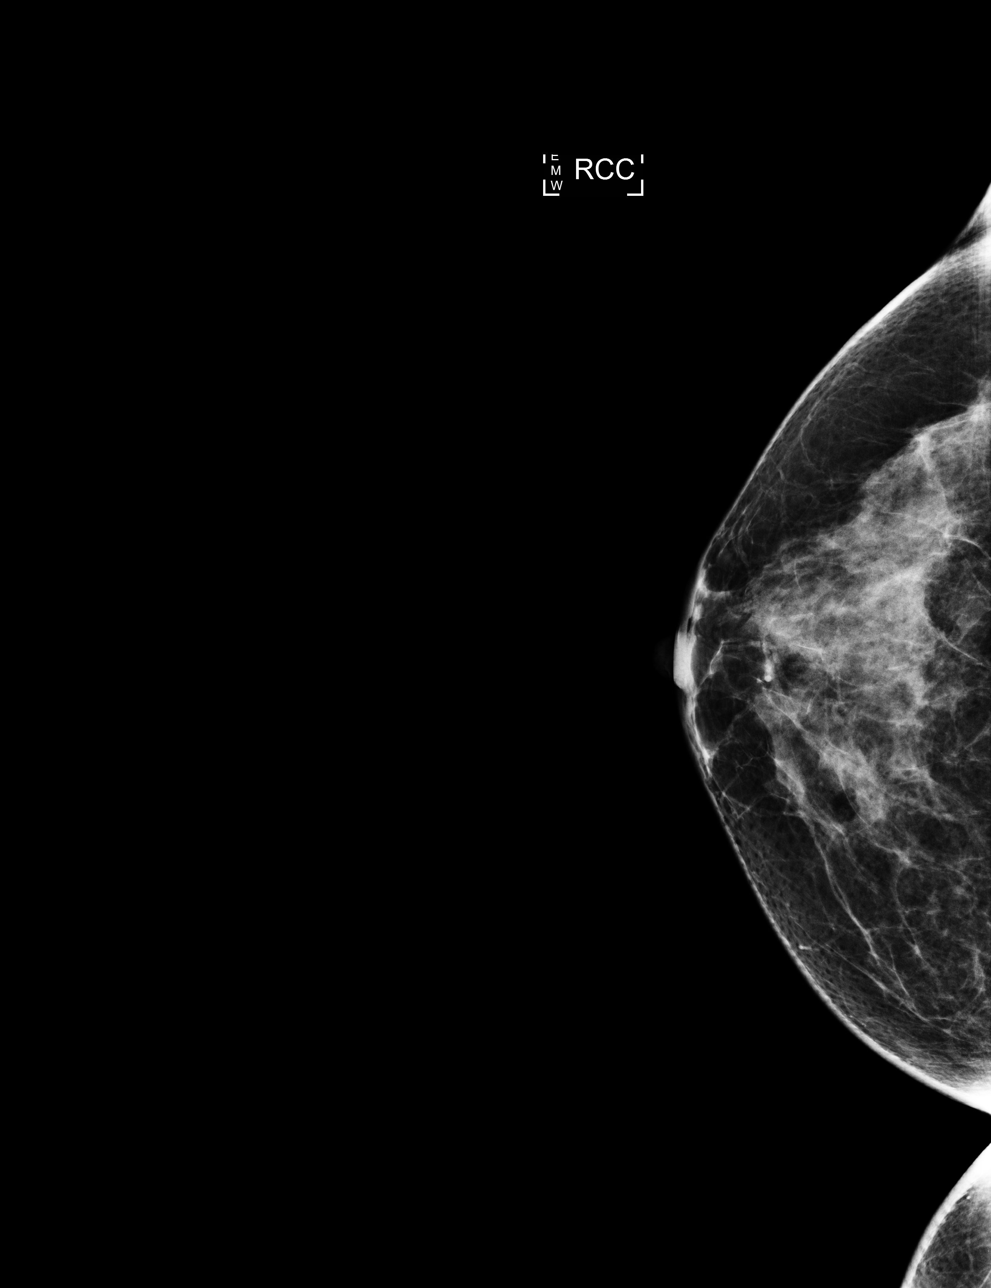

[R MLO]
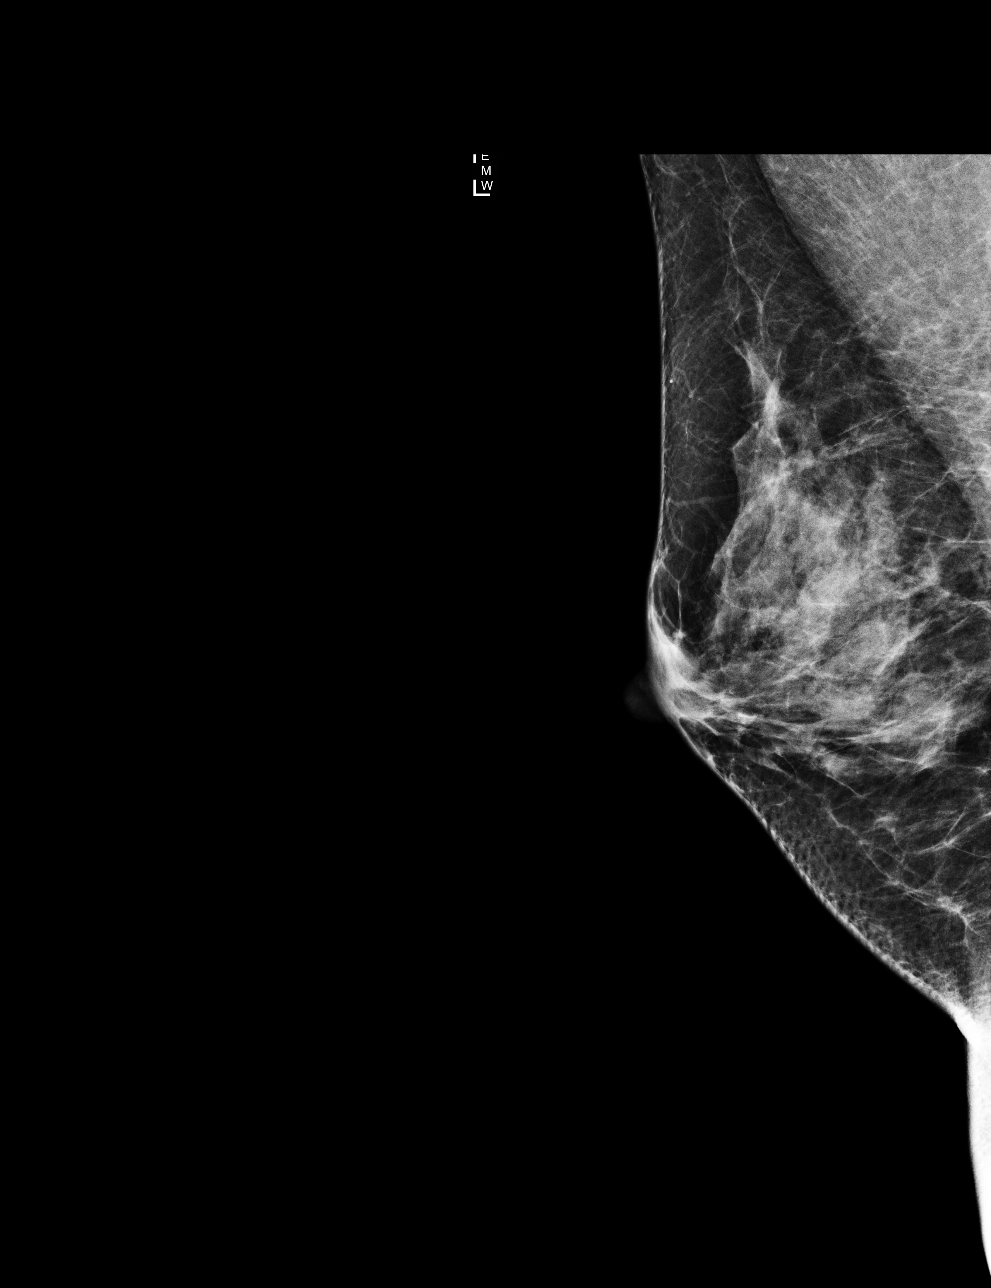

[L MLO]
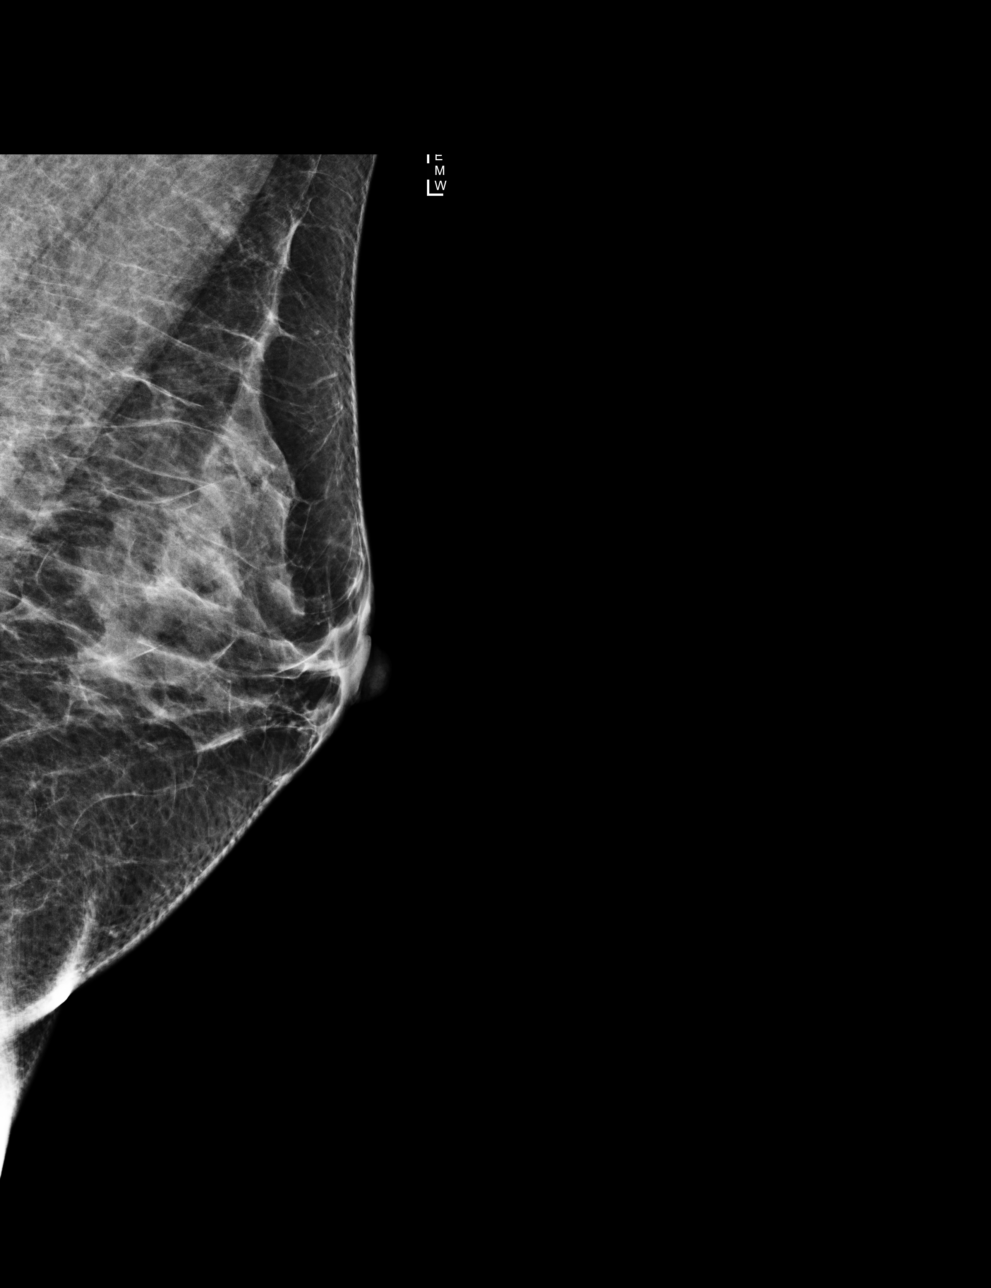

[4 of 4 positions shown; findings below may reference images not displayed]

ACR Breast Density Category d: The breast tissue is extremely dense,
which lowers the sensitivity of mammography.
FINDINGS: There are no findings suspicious for malignancy. Images were
processed with CAD.
IMPRESSION: No mammographic evidence of malignancy. A result letter of this
screening mammogram will be mailed directly to the patient.

RECOMMENDATION:
Screening mammogram in one year. (Code:[id number])

BI-RADS CATEGORY  1: Negative.

## 2017-08-03 ENCOUNTER — Ambulatory Visit (INDEPENDENT_AMBULATORY_CARE_PROVIDER_SITE_OTHER): Payer: 59 | Admitting: Psychology

## 2017-08-03 DIAGNOSIS — F4322 Adjustment disorder with anxiety: Secondary | ICD-10-CM

## 2017-09-07 ENCOUNTER — Ambulatory Visit (INDEPENDENT_AMBULATORY_CARE_PROVIDER_SITE_OTHER): Payer: 59 | Admitting: Psychology

## 2017-09-07 DIAGNOSIS — F4322 Adjustment disorder with anxiety: Secondary | ICD-10-CM

## 2017-09-12 ENCOUNTER — Encounter (HOSPITAL_BASED_OUTPATIENT_CLINIC_OR_DEPARTMENT_OTHER): Payer: Self-pay

## 2017-09-12 ENCOUNTER — Emergency Department (HOSPITAL_BASED_OUTPATIENT_CLINIC_OR_DEPARTMENT_OTHER)
Admission: EM | Admit: 2017-09-12 | Discharge: 2017-09-13 | Disposition: A | Discharge status: Home / Self Care | Payer: 59 | Attending: Emergency Medicine | Admitting: Emergency Medicine

## 2017-09-12 ENCOUNTER — Other Ambulatory Visit: Payer: Self-pay

## 2017-09-12 DIAGNOSIS — Y929 Unspecified place or not applicable: Secondary | ICD-10-CM | POA: Insufficient documentation

## 2017-09-12 DIAGNOSIS — S6992XA Unspecified injury of left wrist, hand and finger(s), initial encounter: Secondary | ICD-10-CM | POA: Diagnosis present

## 2017-09-12 DIAGNOSIS — Y999 Unspecified external cause status: Secondary | ICD-10-CM | POA: Insufficient documentation

## 2017-09-12 DIAGNOSIS — Z79899 Other long term (current) drug therapy: Secondary | ICD-10-CM | POA: Insufficient documentation

## 2017-09-12 DIAGNOSIS — Y939 Activity, unspecified: Secondary | ICD-10-CM | POA: Insufficient documentation

## 2017-09-12 DIAGNOSIS — W272XXA Contact with scissors, initial encounter: Secondary | ICD-10-CM | POA: Insufficient documentation

## 2017-09-12 DIAGNOSIS — S61213A Laceration without foreign body of left middle finger without damage to nail, initial encounter: Secondary | ICD-10-CM | POA: Insufficient documentation

## 2017-09-12 DIAGNOSIS — J45909 Unspecified asthma, uncomplicated: Secondary | ICD-10-CM | POA: Insufficient documentation

## 2017-09-12 MED ORDER — BACITRACIN ZINC 500 UNIT/GM EX OINT: TOPICAL_OINTMENT | Freq: Once | CUTANEOUS | Status: DC

## 2017-09-12 MED ORDER — LIDOCAINE HCL 2 % IJ SOLN
INTRAMUSCULAR | Status: AC
  Filled 2017-09-12: qty 20

## 2017-09-12 NOTE — Discharge Instructions (Signed)
WOUND CARE ?Please have your stitches/staples removed in 10 or sooner if you have concerns. You may do this at any available urgent care or at your primary care doctor's office. ? Keep area clean and dry for 24 hours. Do not remove ?bandage, if applied. ? After 24 hours, remove bandage and wash wound ?gently with mild soap and warm water. Reapply ?a new bandage after cleaning wound, if directed. ? Continue daily cleansing with soap and water until ?stitches/staples are removed. ? Do not apply any ointments or creams to the wound ?while stitches/staples are in place, as this may cause ?delayed healing. ? Seek medical careif you experience any of the following ?signs of infection: Swelling, redness, pus drainage, ?streaking, fever >101.0 F ? Seek care if you experience excessive bleeding ?that does not stop after 15-20 minutes of constant, firm ?pressure. ? ? ?

## 2017-09-12 NOTE — ED Notes (Signed)
Suture cart outside room.

## 2017-09-12 NOTE — ED Triage Notes (Signed)
Pt was cutting hair when she accidentally cut the dorsal surface of the L 3rd digit.

## 2017-09-13 NOTE — ED Provider Notes (Signed)
[facility] EMERGENCY DEPARTMENT Provider Note   CSN: [id number] Arrival date & time: 09/12/17  2307     History   Chief Complaint Chief Complaint  Patient presents with  . Laceration    HPI April Bridges is a 46 y.o. female.  HPI 46 year old female with no pertinent past medical history presents to the ED for evaluation of laceration to her left middle finger.  States that she was cutting here this evening when she asked caught the left middle finger.  States that she used a butterfly bandage at home but thought that it need to be sutured given it was over the joint.  Reports minimal bleeding.  Denies any pain with range of motion or paresthesias or weakness.  Patient states her tetanus shot is up-to-date.  She has not taken nothing for symptoms prior to arrival. Past Medical History:  Diagnosis Date  . Acute bronchitis 06/26/2017  . Allergic state 08/30/2012  . Anxiety state 07/01/2014  . Asthma    cats, dust, triggers  . Asthma   . Asthma exacerbation   . Atypical moles 03/03/2012  . Breast lump on right side at 10 o'clock position 03/21/2012  . Chicken pox as a child  . Gestational thrombocytopenia ([place]) 02/29/2012  . History of recurrent UTIs 07/28/2016  . Hyperglycemia 02/29/2012  . Hyperlipidemia 07/01/2013  . Migraine 06/26/2013  . Migraine 07/01/2013  . Mumps as a child  . Neck pain 07/01/2014  . Preventative health care 03/03/2012  . Skin lesion 07/01/2015   Halo lesion on back present since college Raised verrucous lesion right ankle for several years.  . Thrombocytopenia ([place]) 02/29/2012   Possible anemia/low iron? In past per patient   . Toe pain, right 02/16/2017  . UTI (lower urinary tract infection) 04/14/2012    Patient Active Problem List   Diagnosis Date Noted  . Acute bronchitis 06/26/2017  . Toe pain, right 02/16/2017  . History of recurrent UTIs 07/28/2016  . Skin lesion 07/01/2015  . Hyperlipemia 07/01/2014  . Anxiety state 07/01/2014    . Neck pain 07/01/2014  . Migraine 06/26/2013  . Allergic state 08/30/2012  . Cervical cancer screening 03/21/2012  . Breast lump on right side at 10 o'clock position 03/21/2012  . Preventative health care 03/03/2012  . Atypical moles 03/03/2012  . Hyperglycemia 02/29/2012  . Thrombocytopenia ([place]) 02/29/2012  . Asthma     Past Surgical History:  Procedure Laterality Date  . [place], 2001,2003   X 3  . HAMMER TOE SURGERY  1992   right 5th toe  . TUBAL LIGATION      OB History    No data available       Home Medications    Prior to Admission medications   Medication Sig Start Date End Date Taking? Authorizing Provider  albuterol (PROVENTIL HFA;VENTOLIN HFA) 108 (90 Base) MCG/ACT inhaler Inhale 2 puffs into the lungs every 6 (six) hours as needed for wheezing. 06/16/17 10/14/19  Lance Sell, NP  fluticasone (FLONASE) 50 MCG/ACT nasal spray Place 2 sprays into both nostrils daily as needed for allergies or rhinitis. 06/25/17   Mosie Lukes, MD  fluticasone (FLOVENT HFA) 110 MCG/ACT inhaler Inhale 2 puffs into the lungs 2 (two) times daily. 06/21/17   Saguier, Percell Miller, PA-C  levocetirizine (XYZAL) 5 MG tablet Take 1 tablet (5 mg total) by mouth every evening. 06/17/17   Saguier, Percell Miller, PA-C  methylPREDNISolone (MEDROL) 4 MG tablet 5 tab po qd X 1d then  4 tab po qd X 1d then 3 tab po qd X 1d then 2 tab po qd then 1 tab po qd 06/25/17   Mosie Lukes, MD  montelukast (SINGULAIR) 10 MG tablet Take 1 tablet (10 mg total) by mouth at bedtime as needed. 06/25/17   Mosie Lukes, MD    Family History Family History  Problem Relation Age of Onset  . Thyroid disease Father        Hashimoto's  . Depression Sister        related to cycle  . Muscular dystrophy Son        Donnajean Lopes  . Other Son        Neurogenic bladder  . Asthma Son        mild  . Celiac disease Son   . Stroke Maternal Grandmother   . Heart attack Maternal Grandfather        ?  Marland Kitchen  Heart disease Maternal Grandfather   . Diabetes Paternal Grandmother        type 2  . Hypertension Paternal Grandmother   . Obesity Paternal Grandmother   . Other Sister        gluten intolerate or celiac, rectocele repair x 2 with bowel adhesions.  . Stroke Sister 65       s/p infections and surgeries  . Depression Sister   . Fibromyalgia Sister   . Thyroid disease Sister   . Cholelithiasis Sister   . Celiac disease Son   . Kidney disease Maternal Uncle   . Diabetes Maternal Uncle   . Diabetes Mother        2, diet controlled    Social History Social History   Tobacco Use  . Smoking status: Never Smoker  . Smokeless tobacco: Never Used  Substance Use Topics  . Alcohol use: No  . Drug use: No     Allergies   Molds & smuts and Other   Review of Systems Review of Systems  Musculoskeletal: Positive for myalgias.  Skin: Positive for wound.  Neurological: Negative for weakness and numbness.     Physical Exam Updated Vital Signs Ht 5\' 3"  (1.6 m)   Wt 54.4 kg (120 lb)   LMP 09/07/2017   BMI 21.26 kg/m   Physical Exam  Constitutional: She appears well-developed and well-nourished. No distress.  HENT:  Head: Normocephalic and atraumatic.  Eyes: Right eye exhibits no discharge. Left eye exhibits no discharge. No scleral icterus.  Neck: Normal range of motion.  Pulmonary/Chest: No respiratory distress.  Musculoskeletal: Normal range of motion.  She has approximately 1 cm laceration to the dorsum of the left middle finger over the PIP.  Bleeding is controlled.  Full range of motion of the DIP and PIP without pain.  No nailbed involvement.  No foreign body or debris noted.  Brisk cap refill.  Radial pulses 2+ bilaterally.  Sensation intact.  Neurological: She is alert.  Skin: No pallor.  Psychiatric: Her behavior is normal. Judgment and thought content normal.  Nursing note and vitals reviewed.    ED Treatments / Results  Labs (all labs ordered are listed, but  only abnormal results are displayed) Labs Reviewed - No data to display  EKG  EKG Interpretation None       Radiology No results found.  Procedures .Marland KitchenLaceration Repair Date/Time: 09/13/2017 1:54 AM Performed by: Doristine Devoid, PA-C Authorized by: Doristine Devoid, PA-C   Consent:    Consent obtained:  Verbal  Consent given by:  Patient   Risks discussed:  Infection, need for additional repair, nerve damage, poor wound healing, poor cosmetic result, pain, retained foreign body, tendon damage and vascular damage   Alternatives discussed:  No treatment Anesthesia (see MAR for exact dosages):    Anesthesia method:  Local infiltration   Local anesthetic:  Lidocaine 1% w/o epi Laceration details:    Location:  Finger   Finger location:  L long finger   Length (cm):  1   Laceration depth: Very superficial. Repair type:    Repair type:  Simple Pre-procedure details:    Preparation:  Patient was prepped and draped in usual sterile fashion Exploration:    Hemostasis achieved with:  Direct pressure   Wound exploration: wound explored through full range of motion and entire depth of wound probed and visualized     Wound extent: no foreign bodies/material noted     Contaminated: no   Treatment:    Area cleansed with:  Betadine   Amount of cleaning:  Standard   Irrigation solution:  Sterile saline   Irrigation volume:  60   Irrigation method:  Pressure wash   Visualized foreign bodies/material removed: no   Skin repair:    Repair method:  Sutures   Suture size:  4-0   Suture material:  Prolene   Suture technique:  Simple interrupted   Number of sutures:  3 Approximation:    Approximation:  Close   Vermilion border: well-aligned   Post-procedure details:    Dressing:  Splint for protection and non-adherent dressing   Patient tolerance of procedure:  Tolerated well, no immediate complications   (including critical care time)  Medications Ordered in  ED Medications  lidocaine (XYLOCAINE) 2 % (with pres) injection (not administered)  bacitracin ointment (not administered)     Initial Impression / Assessment and Plan / ED Course  I have reviewed the triage vital signs and the nursing notes.  Pertinent labs & imaging results that were available during my care of the patient were reviewed by me and considered in my medical decision making (see chart for details).     Tdap up-to-date.Pressure irrigation performed. Laceration occurred < 8 hours prior to repair which was well tolerated. Pt has no co morbidities to effect normal wound healing. Discussed suture home care w pt and answered questions. Pt to f-u for wound check and suture removal in 10-14 days. Pt is hemodynamically stable w no complaints prior to dc.     Final Clinical Impressions(s) / ED Diagnoses   Final diagnoses:  Laceration of left middle finger without foreign body without damage to nail, initial encounter    ED Discharge Orders    None       Aaron Edelman 76/72/09 [id number]    Delora Fuel, MD 62/83/66 [id number]

## 2017-10-15 ENCOUNTER — Ambulatory Visit (INDEPENDENT_AMBULATORY_CARE_PROVIDER_SITE_OTHER): Payer: 59 | Admitting: Psychology

## 2017-10-15 DIAGNOSIS — F4322 Adjustment disorder with anxiety: Secondary | ICD-10-CM

## 2017-11-12 ENCOUNTER — Ambulatory Visit (INDEPENDENT_AMBULATORY_CARE_PROVIDER_SITE_OTHER): Payer: 59 | Admitting: Psychology

## 2017-11-12 DIAGNOSIS — F4322 Adjustment disorder with anxiety: Secondary | ICD-10-CM

## 2017-12-17 ENCOUNTER — Ambulatory Visit (INDEPENDENT_AMBULATORY_CARE_PROVIDER_SITE_OTHER): Payer: 59 | Admitting: Psychology

## 2017-12-17 DIAGNOSIS — F4322 Adjustment disorder with anxiety: Secondary | ICD-10-CM

## 2018-02-18 ENCOUNTER — Ambulatory Visit (INDEPENDENT_AMBULATORY_CARE_PROVIDER_SITE_OTHER): Payer: 59 | Admitting: Psychology

## 2018-02-18 DIAGNOSIS — F4322 Adjustment disorder with anxiety: Secondary | ICD-10-CM | POA: Diagnosis not present

## 2018-03-03 ENCOUNTER — Telehealth: Payer: Self-pay | Admitting: Family Medicine

## 2018-03-03 NOTE — Telephone Encounter (Signed)
I was giving the phone after the patient had declined to be triaged.  I spoke with the patient and stated to her " with the SOB you are experiencing Dr. Charlett Blake wants you to be seen sooner rather than later, Dr Charlett Blake schedule is full today and she would rather you be seen in the ER down stairs."   Patient stated "ok I will thanks" Phone hung.

## 2018-03-15 ENCOUNTER — Ambulatory Visit (INDEPENDENT_AMBULATORY_CARE_PROVIDER_SITE_OTHER): Payer: 59 | Admitting: Psychology

## 2018-03-15 DIAGNOSIS — F4322 Adjustment disorder with anxiety: Secondary | ICD-10-CM | POA: Diagnosis not present

## 2018-04-26 ENCOUNTER — Ambulatory Visit (INDEPENDENT_AMBULATORY_CARE_PROVIDER_SITE_OTHER): Payer: 59 | Admitting: Psychology

## 2018-04-26 DIAGNOSIS — F4322 Adjustment disorder with anxiety: Secondary | ICD-10-CM | POA: Diagnosis not present

## 2018-06-03 ENCOUNTER — Ambulatory Visit (INDEPENDENT_AMBULATORY_CARE_PROVIDER_SITE_OTHER): Payer: 59 | Admitting: Psychology

## 2018-06-03 DIAGNOSIS — F4322 Adjustment disorder with anxiety: Secondary | ICD-10-CM

## 2018-07-01 ENCOUNTER — Ambulatory Visit (INDEPENDENT_AMBULATORY_CARE_PROVIDER_SITE_OTHER): Payer: 59 | Admitting: Psychology

## 2018-07-01 DIAGNOSIS — F4322 Adjustment disorder with anxiety: Secondary | ICD-10-CM | POA: Diagnosis not present

## 2018-08-01 ENCOUNTER — Ambulatory Visit (INDEPENDENT_AMBULATORY_CARE_PROVIDER_SITE_OTHER): Payer: 59 | Admitting: Psychology

## 2018-08-01 DIAGNOSIS — F4322 Adjustment disorder with anxiety: Secondary | ICD-10-CM | POA: Diagnosis not present

## 2018-09-02 ENCOUNTER — Ambulatory Visit (INDEPENDENT_AMBULATORY_CARE_PROVIDER_SITE_OTHER): Payer: 59 | Admitting: Psychology

## 2018-09-02 DIAGNOSIS — F4322 Adjustment disorder with anxiety: Secondary | ICD-10-CM

## 2018-10-14 ENCOUNTER — Ambulatory Visit (INDEPENDENT_AMBULATORY_CARE_PROVIDER_SITE_OTHER): Payer: 59 | Admitting: Psychology

## 2018-10-14 DIAGNOSIS — F4322 Adjustment disorder with anxiety: Secondary | ICD-10-CM

## 2018-11-11 ENCOUNTER — Ambulatory Visit (INDEPENDENT_AMBULATORY_CARE_PROVIDER_SITE_OTHER): Payer: 59 | Admitting: Psychology

## 2018-11-11 DIAGNOSIS — F4322 Adjustment disorder with anxiety: Secondary | ICD-10-CM | POA: Diagnosis not present

## 2018-12-12 ENCOUNTER — Ambulatory Visit (INDEPENDENT_AMBULATORY_CARE_PROVIDER_SITE_OTHER): Payer: 59 | Admitting: Psychology

## 2018-12-12 ENCOUNTER — Other Ambulatory Visit: Payer: Self-pay

## 2018-12-12 DIAGNOSIS — F4322 Adjustment disorder with anxiety: Secondary | ICD-10-CM

## 2019-01-30 ENCOUNTER — Ambulatory Visit (INDEPENDENT_AMBULATORY_CARE_PROVIDER_SITE_OTHER): Payer: 59 | Admitting: Psychology

## 2019-01-30 DIAGNOSIS — F4322 Adjustment disorder with anxiety: Secondary | ICD-10-CM

## 2019-03-07 ENCOUNTER — Ambulatory Visit (INDEPENDENT_AMBULATORY_CARE_PROVIDER_SITE_OTHER): Payer: 59 | Admitting: Psychology

## 2019-03-07 DIAGNOSIS — F4322 Adjustment disorder with anxiety: Secondary | ICD-10-CM

## 2019-04-03 ENCOUNTER — Ambulatory Visit: Payer: 59 | Admitting: Psychology

## 2019-04-07 ENCOUNTER — Telehealth: Payer: Self-pay | Admitting: Family Medicine

## 2019-04-07 NOTE — Telephone Encounter (Signed)
LM for pt to call and schedule nurse visit

## 2019-04-07 NOTE — Telephone Encounter (Signed)
Patient requesting TB for employer please advise best # to call back today [phone]

## 2019-04-07 NOTE — Telephone Encounter (Signed)
Please schedule patient for a TB skin test on the nurse schedule on a Tuesday or Wednesday

## 2019-04-10 ENCOUNTER — Other Ambulatory Visit: Payer: Self-pay

## 2019-04-11 ENCOUNTER — Encounter: Payer: Self-pay | Admitting: Medical

## 2019-04-11 ENCOUNTER — Ambulatory Visit (INDEPENDENT_AMBULATORY_CARE_PROVIDER_SITE_OTHER): Payer: 59 | Admitting: Medical

## 2019-04-11 ENCOUNTER — Ambulatory Visit: Payer: 59

## 2019-04-11 ENCOUNTER — Ambulatory Visit (HOSPITAL_BASED_OUTPATIENT_CLINIC_OR_DEPARTMENT_OTHER)
Admission: RE | Admit: 2019-04-11 | Discharge: 2019-04-11 | Disposition: A | Discharge status: Home / Self Care | Payer: 59 | Source: Ambulatory Visit | Attending: Medical | Admitting: Medical

## 2019-04-11 VITALS — BP 117/55 | HR 69 | Temp 98.4°F | Resp 16 | Ht 63.0 in | Wt 134.0 lb

## 2019-04-11 DIAGNOSIS — Z111 Encounter for screening for respiratory tuberculosis: Secondary | ICD-10-CM

## 2019-04-11 DIAGNOSIS — M25522 Pain in left elbow: Secondary | ICD-10-CM

## 2019-04-11 DIAGNOSIS — M25532 Pain in left wrist: Secondary | ICD-10-CM

## 2019-04-11 MED ORDER — DICLOFENAC SODIUM 75 MG PO TBEC: 75.0000 mg | DELAYED_RELEASE_TABLET | Freq: Two times a day (BID) | ORAL | 0 refills | Status: DC

## 2019-04-11 NOTE — Patient Instructions (Addendum)
You appear to have lateral epicondylitis and wrist pain. Will rx diclofenac and recommend tennis elbow splint. Refer to sports med also.  Will get ppd today and have you follow up 2 days for read.

## 2019-04-11 NOTE — Progress Notes (Signed)
Subjective:    [name], female    DOB: 1971/05/04, 48 y.o.   MRN: [id number]  HPI  Pt in with some left elbow and left wrist pain 3-4 weeks. Pt has to help care for her child. She has to lift him a lot. She has lifted less but her elbow and wrist still hurt.  When squeezes her hand left side has lateral epicondyle burn and some forearm pain. Pt is rt handed. Supination hurts.  Pt has tried some advil for couple of days.  She only has faint ache to her wrist.  Pt also states she has to have tb test for work. 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 Better for her to work/take care of him than other in light of covid pandemic.  Pt states 2 weeks from now will leave [facility] [employer].     Review of Systems  Constitutional: Negative for chills, fatigue and fever.  Respiratory: Negative for chest tightness, shortness of breath and wheezing.   Cardiovascular: Negative for chest pain and palpitations.  Gastrointestinal: Negative for abdominal pain.  Musculoskeletal:       See hpi.  Skin: Negative for rash.  Neurological: Negative for dizziness and headaches.  Hematological: Negative for adenopathy. Does not bruise/bleed easily.  Psychiatric/Behavioral: Negative for behavioral problems and confusion.    Past Medical History:  Diagnosis Date  . Acute bronchitis 06/26/2017  . Allergic state 08/30/2012  . Anxiety state 07/01/2014  . Asthma    cats, dust, triggers  . Asthma   . Asthma exacerbation   . Atypical moles 03/03/2012  . Breast lump on right side at 10 o'clock position 03/21/2012  . Chicken pox as a child  . Gestational thrombocytopenia ([place]) 02/29/2012  . History of recurrent UTIs 07/28/2016  . Hyperglycemia 02/29/2012  . Hyperlipidemia 07/01/2013  . Migraine 06/26/2013  . Migraine 07/01/2013  . Mumps as a child  . Neck pain 07/01/2014  . Preventative health care 03/03/2012  . Skin lesion 07/01/2015   Halo lesion on back present since college  Raised verrucous lesion right ankle for several years.  . Thrombocytopenia ([place]) 02/29/2012   Possible anemia/low iron? In past per patient   . Toe pain, right 02/16/2017  . UTI (lower urinary tract infection) 04/14/2012     Social History   Socioeconomic History  . Marital status: Married    Spouse name: Not on file  . Number of children: Not on file  . Years of education: Not on file  . Highest education level: Not on file  Occupational History  . Not on file  Social Needs  . Financial resource strain: Not on file  . Food insecurity    Worry: Not on file    Inability: Not on file  . Transportation needs    Medical: Not on file    Non-medical: Not on file  Tobacco Use  . Smoking status: Never Smoker  . Smokeless tobacco: Never Used  Substance and Sexual Activity  . Alcohol use: No  . Drug use: No  . Sexual activity: Yes    Partners: Male  Lifestyle  . Physical activity    Days per week: Not on file    Minutes per session: Not on file  . Stress: Not on file  Relationships  . Social Herbalist on phone: Not on file    Gets together: Not on file    Attends religious service: Not on  file    Active member of club or organization: Not on file    Attends meetings of clubs or organizations: Not on file    Relationship status: Not on file  . Intimate partner violence    Fear of current or ex partner: Not on file    Emotionally abused: Not on file    Physically abused: Not on file    Forced sexual activity: Not on file  Other Topics Concern  . Not on file  Social History Narrative  . Not on file    Past Surgical History:  Procedure Laterality Date  . [place], 2001,2003   X 3  . HAMMER TOE SURGERY  1992   right 5th toe  . TUBAL LIGATION      Family History  Problem Relation Age of Onset  . Thyroid disease Father        Hashimoto's  . Depression Sister        related to cycle  . Muscular dystrophy Son        Donnajean Lopes  . Other  Son        Neurogenic bladder  . Asthma Son        mild  . Celiac disease Son   . Stroke Maternal Grandmother   . Heart attack Maternal Grandfather        ?  Marland Kitchen Heart disease Maternal Grandfather   . Diabetes Paternal Grandmother        type 2  . Hypertension Paternal Grandmother   . Obesity Paternal Grandmother   . Other Sister        gluten intolerate or celiac, rectocele repair x 2 with bowel adhesions.  . Stroke Sister 33       s/p infections and surgeries  . Depression Sister   . Fibromyalgia Sister   . Thyroid disease Sister   . Cholelithiasis Sister   . Celiac disease Son   . Kidney disease Maternal Uncle   . Diabetes Maternal Uncle   . Diabetes Mother        2, diet controlled    Allergies  Allergen Reactions  . Molds & Smuts     COUGH, TIGHTNESS IN CHEST  . Other     CAT DANDER= tightness in chest, watery eyes, cough    Current Outpatient Medications on File Prior to Visit  Medication Sig Dispense Refill  . albuterol (PROVENTIL HFA;VENTOLIN HFA) 108 (90 Base) MCG/ACT inhaler Inhale 2 puffs into the lungs every 6 (six) hours as needed for wheezing. 1 Inhaler 1  . fluticasone (FLONASE) 50 MCG/ACT nasal spray Place 2 sprays into both nostrils daily as needed for allergies or rhinitis. 16 g 2  . fluticasone (FLOVENT HFA) 110 MCG/ACT inhaler Inhale 2 puffs into the lungs 2 (two) times daily. 1 Inhaler 2  . levocetirizine (XYZAL) 5 MG tablet Take 1 tablet (5 mg total) by mouth every evening. 30 tablet 2  . methylPREDNISolone (MEDROL) 4 MG tablet 5 tab po qd X 1d then 4 tab po qd X 1d then 3 tab po qd X 1d then 2 tab po qd then 1 tab po qd 15 tablet 1  . montelukast (SINGULAIR) 10 MG tablet Take 1 tablet (10 mg total) by mouth at bedtime as needed. 30 tablet 2   No current facility-administered medications on file prior to visit.     BP (!) 117/55   Pulse 69   Temp 98.4 F (36.9 C) (Oral)   Resp 16  Ht 5\' 3"  (1.6 m)   Wt 134 lb (60.8 kg)   SpO2 100%   BMI  23.74 kg/m       Objective:   Physical Exam  General- No acute distress. Pleasant patient. Neck- Full range of motion, no jvd Lungs- Clear, even and unlabored. Heart- regular rate and rhythm. Neurologic- CNII- XII grossly intact.  Left upper ext- lateral epicondyle pain on palpation. Pain on supination, pronation, flexion and ext.   Left wrist- mild pain on rom.     Assessment & Plan:  You appear to have lateral epicondylitis and wrist pain. Will rx diclofenac and recommend tennis elbow splint. Refer to sports med also.  Will get ppd today and have you follow up 2 days for read.  Mackie Pai, PA-C

## 2019-04-12 ENCOUNTER — Ambulatory Visit: Payer: Self-pay

## 2019-04-12 ENCOUNTER — Encounter: Payer: Self-pay | Admitting: Family Medicine

## 2019-04-12 ENCOUNTER — Other Ambulatory Visit: Payer: Self-pay

## 2019-04-12 ENCOUNTER — Ambulatory Visit (INDEPENDENT_AMBULATORY_CARE_PROVIDER_SITE_OTHER): Payer: 59 | Admitting: Family Medicine

## 2019-04-12 VITALS — BP 117/80 | HR 65 | Ht 63.0 in | Wt 135.0 lb

## 2019-04-12 DIAGNOSIS — M7712 Lateral epicondylitis, left elbow: Secondary | ICD-10-CM

## 2019-04-12 DIAGNOSIS — M25522 Pain in left elbow: Secondary | ICD-10-CM

## 2019-04-12 HISTORY — DX: Lateral epicondylitis, left elbow: M77.12

## 2019-04-12 MED ORDER — PENNSAID 2 % TD SOLN: 1.0000 "application " | Freq: Two times a day (BID) | TRANSDERMAL | 3 refills | Status: DC

## 2019-04-12 NOTE — Assessment & Plan Note (Signed)
Findings on clinical exam and ultrasound are most consistent with lateral epicondylitis.  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 -Counseled on the use of diclofenac. -Pennsaid sent in. -Counseled on compression -Counseled on home exercise therapy and supportive care. -If no improvement can consider nitroglycerin or PT.

## 2019-04-12 NOTE — Patient Instructions (Signed)
Nice to meet you Please take the diclofenac for 4 days straight and then as needed Please try the pennsaid after that. Apply peasize to the area  Please consider trying compression  Please try the exercises   Please send me a message in MyChart with any questions or updates.  Please see me back in 4 weeks and we can do a virtual visit.   --Dr. Raeford Razor

## 2019-04-12 NOTE — Progress Notes (Signed)
April Bridges - 48 y.o. female MRN [id number]  Date of birth: 25-Jul-1971  SUBJECTIVE:  Including CC & ROS.  Chief Complaint  Patient presents with  . Elbow Pain    left elbow x 1 month    April Bridges is a 48 y.o. female that is presenting with left elbow pain.  The pain is occurring over the lateral epicondyle.  The pain is been ongoing for 1 month.  She does do a lot of transferring of her son.  He suffers from muscular dystrophy.  The pain is intermittent in nature.  Is worse when she is doing certain movements.  The pain can range from mild to severe.  It is a burning situation at times.  Has been started recently in diclofenac and notices some improvement.  No history of trauma or injury.  No prior surgery the elbow.   Review of Systems  Constitutional: Negative for fever.  HENT: Negative for congestion.   Respiratory: Negative for cough.   Cardiovascular: Negative for chest pain.  Gastrointestinal: Negative for abdominal pain.  Musculoskeletal: Negative for back pain.  Skin: Negative for color change.  Neurological: Negative for weakness.  Hematological: Negative for adenopathy.    HISTORY: Past Medical, Surgical, Social, and Family History Reviewed & Updated per EMR.   Pertinent Historical Findings include:  Past Medical History:  Diagnosis Date  . Acute bronchitis 06/26/2017  . Allergic state 08/30/2012  . Anxiety state 07/01/2014  . Asthma    cats, dust, triggers  . Asthma   . Asthma exacerbation   . Atypical moles 03/03/2012  . Breast lump on right side at 10 o'clock position 03/21/2012  . Chicken pox as a child  . Gestational thrombocytopenia ([place]) 02/29/2012  . History of recurrent UTIs 07/28/2016  . Hyperglycemia 02/29/2012  . Hyperlipidemia 07/01/2013  . Migraine 06/26/2013  . Migraine 07/01/2013  . Mumps as a child  . Neck pain 07/01/2014  . Preventative health care 03/03/2012  . Skin lesion 07/01/2015   Halo lesion on back present since college Raised  verrucous lesion right ankle for several years.  . Thrombocytopenia ([place]) 02/29/2012   Possible anemia/low iron? In past per patient   . Toe pain, right 02/16/2017  . UTI (lower urinary tract infection) 04/14/2012    Past Surgical History:  Procedure Laterality Date  . [place], 2001,2003   X 3  . HAMMER TOE SURGERY  1992   right 5th toe  . TUBAL LIGATION      Allergies  Allergen Reactions  . Molds & Smuts     COUGH, TIGHTNESS IN CHEST  . Other     CAT DANDER= tightness in chest, watery eyes, cough    Family History  Problem Relation Age of Onset  . Thyroid disease Father        Hashimoto's  . Depression Sister        related to cycle  . Muscular dystrophy Son        Donnajean Lopes  . Other Son        Neurogenic bladder  . Asthma Son        mild  . Celiac disease Son   . Stroke Maternal Grandmother   . Heart attack Maternal Grandfather        ?  Marland Kitchen Heart disease Maternal Grandfather   . Diabetes Paternal Grandmother        type 2  . Hypertension Paternal Grandmother   . Obesity Paternal Grandmother   .  Other Sister        gluten intolerate or celiac, rectocele repair x 2 with bowel adhesions.  . Stroke Sister 13       s/p infections and surgeries  . Depression Sister   . Fibromyalgia Sister   . Thyroid disease Sister   . Cholelithiasis Sister   . Celiac disease Son   . Kidney disease Maternal Uncle   . Diabetes Maternal Uncle   . Diabetes Mother        2, diet controlled     Social History   Socioeconomic History  . Marital status: Married    Spouse name: Not on file  . Number of children: Not on file  . Years of education: Not on file  . Highest education level: Not on file  Occupational History  . Not on file  Social Needs  . Financial resource strain: Not on file  . Food insecurity    Worry: Not on file    Inability: Not on file  . Transportation needs    Medical: Not on file    Non-medical: Not on file  Tobacco Use  .  Smoking status: Never Smoker  . Smokeless tobacco: Never Used  Substance and Sexual Activity  . Alcohol use: No  . Drug use: No  . Sexual activity: Yes    Partners: Male  Lifestyle  . Physical activity    Days per week: Not on file    Minutes per session: Not on file  . Stress: Not on file  Relationships  . Social Herbalist on phone: Not on file    Gets together: Not on file    Attends religious service: Not on file    Active member of club or organization: Not on file    Attends meetings of clubs or organizations: Not on file    Relationship status: Not on file  . Intimate partner violence    Fear of current or ex partner: Not on file    Emotionally abused: Not on file    Physically abused: Not on file    Forced sexual activity: Not on file  Other Topics Concern  . Not on file  Social History Narrative  . Not on file     PHYSICAL EXAM:  VS: BP 117/80   Pulse 65   Ht 5\' 3"  (1.6 m)   Wt 135 lb (61.2 kg)   LMP 03/22/2019   BMI 23.91 kg/m  Physical Exam Gen: NAD, alert, cooperative with exam, well-appearing ENT: normal lips, normal nasal mucosa,  Eye: normal EOM, normal conjunctiva and lids CV:  no edema, +2 pedal pulses   Resp: no accessory muscle use, non-labored,  GI: no masses or tenderness, no hernia  Skin: no rashes, no areas of induration  Neuro: normal tone, normal sensation to touch Psych:  normal insight, alert and oriented MSK:  Left elbow: Normal range of motion. Tenderness palpation of the lateral epicondyle. No swelling or redness. No significant pain with supination or pronation. Pain with resistance of middle finger extension. Neurovascular intact  Limited ultrasound: Left elbow:  No effusion within the elbow joint. No increased vascularity at the origin of the common extensors.  There appears to be hypoechoic change at the deep attachment to suggest an effusion.  No chronic changes or mucus changes of the tendon  Summary:  Findings suggest that epicondylitis  Ultrasound and interpretation by Clearance Coots, MD      ASSESSMENT & PLAN:   Lateral  epicondylitis of left elbow Findings on clinical exam and ultrasound are most consistent with lateral epicondylitis.  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 -Counseled on the use of diclofenac. -Pennsaid sent in. -Counseled on compression -Counseled on home exercise therapy and supportive care. -If no improvement can consider nitroglycerin or PT.

## 2019-04-13 LAB — TB SKIN TEST
Induration: NEGATIVE mm
TB Skin Test: NEGATIVE

## 2019-04-17 ENCOUNTER — Ambulatory Visit (INDEPENDENT_AMBULATORY_CARE_PROVIDER_SITE_OTHER): Payer: 59 | Admitting: Psychology

## 2019-04-17 DIAGNOSIS — F4322 Adjustment disorder with anxiety: Secondary | ICD-10-CM

## 2019-05-25 ENCOUNTER — Ambulatory Visit (INDEPENDENT_AMBULATORY_CARE_PROVIDER_SITE_OTHER): Payer: 59 | Admitting: Psychology

## 2019-05-25 DIAGNOSIS — F4322 Adjustment disorder with anxiety: Secondary | ICD-10-CM | POA: Diagnosis not present

## 2019-06-22 ENCOUNTER — Ambulatory Visit (INDEPENDENT_AMBULATORY_CARE_PROVIDER_SITE_OTHER): Payer: 59 | Admitting: Psychology

## 2019-06-22 DIAGNOSIS — F4322 Adjustment disorder with anxiety: Secondary | ICD-10-CM | POA: Diagnosis not present

## 2019-07-27 ENCOUNTER — Ambulatory Visit: Payer: 59 | Admitting: Psychology

## 2019-07-28 ENCOUNTER — Ambulatory Visit (INDEPENDENT_AMBULATORY_CARE_PROVIDER_SITE_OTHER): Payer: 59 | Admitting: Psychology

## 2019-07-28 DIAGNOSIS — F4322 Adjustment disorder with anxiety: Secondary | ICD-10-CM

## 2019-08-24 ENCOUNTER — Encounter: Payer: 59 | Admitting: Family Medicine

## 2019-09-06 ENCOUNTER — Other Ambulatory Visit: Payer: Self-pay

## 2019-09-07 ENCOUNTER — Ambulatory Visit (INDEPENDENT_AMBULATORY_CARE_PROVIDER_SITE_OTHER): Payer: 59 | Admitting: Psychology

## 2019-09-07 ENCOUNTER — Ambulatory Visit (INDEPENDENT_AMBULATORY_CARE_PROVIDER_SITE_OTHER): Payer: 59 | Admitting: Family Medicine

## 2019-09-07 ENCOUNTER — Ambulatory Visit (HOSPITAL_BASED_OUTPATIENT_CLINIC_OR_DEPARTMENT_OTHER)
Admission: RE | Admit: 2019-09-07 | Discharge: 2019-09-07 | Disposition: A | Discharge status: Home / Self Care | Payer: 59 | Source: Ambulatory Visit | Attending: Family Medicine | Admitting: Family Medicine

## 2019-09-07 ENCOUNTER — Encounter: Payer: Self-pay | Admitting: Family Medicine

## 2019-09-07 ENCOUNTER — Other Ambulatory Visit (HOSPITAL_COMMUNITY)
Admission: RE | Admit: 2019-09-07 | Discharge: 2019-09-07 | Disposition: A | Discharge status: Home / Self Care | Payer: 59 | Source: Ambulatory Visit | Attending: Family Medicine | Admitting: Family Medicine

## 2019-09-07 VITALS — BP 121/57 | HR 60 | Temp 95.4°F | Ht 63.0 in | Wt 133.8 lb

## 2019-09-07 DIAGNOSIS — J45909 Unspecified asthma, uncomplicated: Secondary | ICD-10-CM

## 2019-09-07 DIAGNOSIS — Z1231 Encounter for screening mammogram for malignant neoplasm of breast: Secondary | ICD-10-CM | POA: Insufficient documentation

## 2019-09-07 DIAGNOSIS — E785 Hyperlipidemia, unspecified: Secondary | ICD-10-CM | POA: Diagnosis not present

## 2019-09-07 DIAGNOSIS — R739 Hyperglycemia, unspecified: Secondary | ICD-10-CM | POA: Diagnosis not present

## 2019-09-07 DIAGNOSIS — Z124 Encounter for screening for malignant neoplasm of cervix: Secondary | ICD-10-CM | POA: Insufficient documentation

## 2019-09-07 DIAGNOSIS — Z1239 Encounter for other screening for malignant neoplasm of breast: Secondary | ICD-10-CM

## 2019-09-07 DIAGNOSIS — F4322 Adjustment disorder with anxiety: Secondary | ICD-10-CM | POA: Diagnosis not present

## 2019-09-07 DIAGNOSIS — Z87440 Personal history of urinary (tract) infections: Secondary | ICD-10-CM

## 2019-09-07 DIAGNOSIS — Z0000 Encounter for general adult medical examination without abnormal findings: Secondary | ICD-10-CM | POA: Diagnosis not present

## 2019-09-07 NOTE — Assessment & Plan Note (Signed)
Following with urology and doing well

## 2019-09-07 NOTE — Assessment & Plan Note (Signed)
hgba1c acceptable, minimize simple carbs. Increase exercise as tolerated.  

## 2019-09-07 NOTE — Assessment & Plan Note (Signed)
Patient encouraged to maintain heart healthy diet, regular exercise, adequate sleep. Consider daily probiotics. Take medications as prescribed. Labs ordered and reviewed, MGM ordered

## 2019-09-07 NOTE — Assessment & Plan Note (Signed)
Encouraged heart healthy diet, increase exercise, avoid trans fats, consider a krill oil cap daily 

## 2019-09-07 NOTE — Assessment & Plan Note (Signed)
Pap today, no concerns on exam.  

## 2019-09-07 NOTE — Assessment & Plan Note (Signed)
She is now under the care of [facility] and they have treated her successfully for allergy induced asthma in the spring and fall. She takes Firef[employer] and that helps

## 2019-09-07 NOTE — Patient Instructions (Signed)
Multivitamin with minerals, selenium, zinc, vitamin c and d Vitamin 2000 IU daily ECASA 81 mg once daily  Melatonin 1-5 mg at bedtime   Omron blood pressure cuff, upper arm Pulse oximeter want oxygen in the 90s  Preventive Care 11-48 Years Old, Female Preventive care refers to visits with your health care provider and lifestyle choices that can promote health and wellness. This includes:  A yearly physical exam. This may also be called an annual well check.  Regular dental visits and eye exams.  Immunizations.  Screening for certain conditions.  Healthy lifestyle choices, such as eating a healthy diet, getting regular exercise, not using drugs or products that contain nicotine and tobacco, and limiting alcohol use. What can I expect for my preventive care visit? Physical exam Your health care provider will check your:  Height and weight. This may be used to calculate body mass index (BMI), which tells if you are at a healthy weight.  Heart rate and blood pressure.  Skin for abnormal spots. Counseling Your health care provider may ask you questions about your:  Alcohol, tobacco, and drug use.  Emotional well-being.  Home and relationship well-being.  Sexual activity.  Eating habits.  Work and work Statistician.  Method of birth control.  Menstrual cycle.  Pregnancy history. What immunizations do I need?  Influenza (flu) vaccine  This is recommended every year. Tetanus, diphtheria, and pertussis (Tdap) vaccine  You may need a Td booster every 10 years. Varicella (chickenpox) vaccine  You may need this if you have not been vaccinated. Zoster (shingles) vaccine  You may need this after age 54. Measles, mumps, and rubella (MMR) vaccine  You may need at least one dose of MMR if you were born in 1957 or later. You may also need a second dose. Pneumococcal conjugate (PCV13) vaccine  You may need this if you have certain conditions and were not previously  vaccinated. Pneumococcal polysaccharide (PPSV23) vaccine  You may need one or two doses if you smoke cigarettes or if you have certain conditions. Meningococcal conjugate (MenACWY) vaccine  You may need this if you have certain conditions. Hepatitis A vaccine  You may need this if you have certain conditions or if you travel or work in places where you may be exposed to hepatitis A. Hepatitis B vaccine  You may need this if you have certain conditions or if you travel or work in places where you may be exposed to hepatitis B. Haemophilus influenzae type b (Hib) vaccine  You may need this if you have certain conditions. Human papillomavirus (HPV) vaccine  If recommended by your health care provider, you may need three doses over 6 months. You may receive vaccines as individual doses or as more than one vaccine together in one shot (combination vaccines). Talk with your health care provider about the risks and benefits of combination vaccines. What tests do I need? Blood tests  Lipid and cholesterol levels. These may be checked every 5 years, or more frequently if you are over 73 years old.  Hepatitis C test.  Hepatitis B test. Screening  Lung cancer screening. You may have this screening every year starting at age 59 if you have a 30-pack-year history of smoking and currently smoke or have quit within the past 15 years.  Colorectal cancer screening. All adults should have this screening starting at age 18 and continuing until age 65. Your health care provider may recommend screening at age 32 if you are at increased risk. You will  tests every 1-10 years, depending on your results and the type of screening test.  Diabetes screening. This is done by checking your blood sugar (glucose) after you have not eaten for a while (fasting). You may have this done every 1-3 years.  Mammogram. This may be done every 1-2 years. Talk with your health care provider about when you should start  having regular mammograms. This may depend on whether you have a family history of breast cancer.  BRCA-related cancer screening. This may be done if you have a family history of breast, ovarian, tubal, or peritoneal cancers.  Pelvic exam and Pap test. This may be done every 3 years starting at age 21. Starting at age 30, this may be done every 5 years if you have a Pap test in combination with an HPV test. Other tests  Sexually transmitted disease (STD) testing.  Bone density scan. This is done to screen for osteoporosis. You may have this scan if you are at high risk for osteoporosis. Follow these instructions at home: Eating and drinking  Eat a diet that includes fresh fruits and vegetables, whole grains, lean protein, and low-fat dairy.  Take vitamin and mineral supplements as recommended by your health care provider.  Do not drink alcohol if: ? Your health care provider tells you not to drink. ? You are pregnant, may be pregnant, or are planning to become pregnant.  If you drink alcohol: ? Limit how much you have to 0-1 drink a day. ? Be aware of how much alcohol is in your drink. In the [place] one drink equals one 12 oz bottle of beer (355 mL), one 5 oz glass of wine (148 mL), or one 1 oz glass of hard liquor (44 mL). Lifestyle  Take daily care of your teeth and gums.  Stay active. Exercise for at least 30 minutes on 5 or more days each week.  Do not use any products that contain nicotine or tobacco, such as cigarettes, e-cigarettes, and chewing tobacco. If you need help quitting, ask your health care provider.  If you are sexually active, practice safe sex. Use a condom or other form of birth control (contraception) in order to prevent pregnancy and STIs (sexually transmitted infections).  If told by your health care provider, take low-dose aspirin daily starting at age 50. What's next?  Visit your health care provider once a year for a well check visit.  Ask your health  care provider how often you should have your eyes and teeth checked.  Stay up to date on all vaccines. This information is not intended to replace advice given to you by your health care provider. Make sure you discuss any questions you have with your health care provider. Document Released: 10/04/2015 Document Revised: 05/19/2018 Document Reviewed: 05/19/2018 Elsevier Patient Education  2020 Elsevier Inc.  

## 2019-09-07 NOTE — Progress Notes (Signed)
Subjective:    [name], female    DOB: 11/25/1970, 48 y.o.   MRN: [id number]  Chief Complaint  Patient presents with  . Annual Exam    HPI Patient is in today for annual preventative exam and follow up on chronic medical concerns. No recent febrile illness or hospitalizations. No polyuria or polydipsia. She hs had to go to college with her special needs kid but is otherwise trying to maintain quarantine. She is eating well and staying active. She is following with pulmonology for her asthma and urology for her UTIs and both of those are doing well. Her mother has been diagnosed with breast cancer in April. HER2+ with some LN involvement and 2.5 cm, which was removed. She has declined chemo and is doing well. Denies CP/palp/SOB/HA/congestion/fevers/GI or GU c/o. Taking meds as prescribed  Past Medical History:  Diagnosis Date  . Acute bronchitis 06/26/2017  . Allergic state 08/30/2012  . Anxiety state 07/01/2014  . Asthma    cats, dust, triggers  . Asthma   . Asthma exacerbation   . Atypical moles 03/03/2012  . Breast lump on right side at 10 o'clock position 03/21/2012  . Chicken pox as a child  . Gestational thrombocytopenia ([place]) 02/29/2012  . History of recurrent UTIs 07/28/2016  . Hyperglycemia 02/29/2012  . Hyperlipidemia 07/01/2013  . Migraine 06/26/2013  . Migraine 07/01/2013  . Mumps as a child  . Neck pain 07/01/2014  . Preventative health care 03/03/2012  . Skin lesion 07/01/2015   Halo lesion on back present since college Raised verrucous lesion right ankle for several years.  . Thrombocytopenia ([place]) 02/29/2012   Possible anemia/low iron? In past per patient   . Toe pain, right 02/16/2017  . UTI (lower urinary tract infection) 04/14/2012    Past Surgical History:  Procedure Laterality Date  . [place], 2001,2003   X 3  . HAMMER TOE SURGERY  1992   right 5th toe  . TUBAL LIGATION      Family History  Problem Relation Age of Onset  .  Thyroid disease Father        Hashimoto's  . Depression Sister        related to cycle  . Muscular dystrophy Son        Donnajean Lopes  . Other Son        Neurogenic bladder  . Asthma Son        mild  . Celiac disease Son   . Stroke Maternal Grandmother   . Heart attack Maternal Grandfather        ?  Marland Kitchen Heart disease Maternal Grandfather   . Diabetes Paternal Grandmother        type 2  . Hypertension Paternal Grandmother   . Obesity Paternal Grandmother   . Other Sister        gluten intolerate or celiac, rectocele repair x 2 with bowel adhesions.  . Stroke Sister 14       s/p infections and surgeries  . Depression Sister   . Fibromyalgia Sister   . Thyroid disease Sister   . Cholelithiasis Sister   . Celiac disease Son   . Kidney disease Maternal Uncle   . Diabetes Maternal Uncle   . Diabetes Mother        2, diet controlled  . Cancer Mother        breast    Social History   Socioeconomic History  . Marital status: Married  Spouse name: Not on file  . Number of children: Not on file  . Years of education: Not on file  . Highest education level: Not on file  Occupational History  . Not on file  Tobacco Use  . Smoking status: Never Smoker  . Smokeless tobacco: Never Used  Substance and Sexual Activity  . Alcohol use: No  . Drug use: No  . Sexual activity: Yes    Partners: Male  Other Topics Concern  . Not on file  Social History Narrative  . Not on file   Social Determinants of Health   Financial Resource Strain:   . Difficulty of Paying Living Expenses: Not on file  Food Insecurity:   . Worried About Charity fundraiser in the Last Year: Not on file  . Ran Out of Food in the Last Year: Not on file  Transportation Needs:   . Lack of Transportation (Medical): Not on file  . Lack of Transportation (Non-Medical): Not on file  Physical Activity:   . Days of Exercise per Week: Not on file  . Minutes of Exercise per Session: Not on file  Stress:   .  Feeling of Stress : Not on file  Social Connections:   . Frequency of Communication with Friends and Family: Not on file  . Frequency of Social Gatherings with Friends and Family: Not on file  . Attends Religious Services: Not on file  . Active Member of Clubs or Organizations: Not on file  . Attends Archivist Meetings: Not on file  . Marital Status: Not on file  Intimate Partner Violence:   . Fear of Current or Ex-Partner: Not on file  . Emotionally Abused: Not on file  . Physically Abused: Not on file  . Sexually Abused: Not on file    Outpatient Medications Prior to Visit  Medication Sig Dispense Refill  . albuterol (PROVENTIL HFA;VENTOLIN HFA) 108 (90 Base) MCG/ACT inhaler Inhale 2 puffs into the lungs every 6 (six) hours as needed for wheezing. 1 Inhaler 1  . fluticasone (FLOVENT HFA) 110 MCG/ACT inhaler Inhale 2 puffs into the lungs 2 (two) times daily. 1 Inhaler 2  . diclofenac (VOLTAREN) 75 MG EC tablet Take 1 tablet (75 mg total) by mouth 2 (two) times daily. 30 tablet 0  . Diclofenac Sodium (PENNSAID) 2 % SOLN Place 1 application onto the skin 2 (two) times daily. 112 g 3  . fluticasone (FLONASE) 50 MCG/ACT nasal spray Place 2 sprays into both nostrils daily as needed for allergies or rhinitis. 16 g 2  . levocetirizine (XYZAL) 5 MG tablet Take 1 tablet (5 mg total) by mouth every evening. 30 tablet 2  . methylPREDNISolone (MEDROL) 4 MG tablet 5 tab po qd X 1d then 4 tab po qd X 1d then 3 tab po qd X 1d then 2 tab po qd then 1 tab po qd 15 tablet 1  . montelukast (SINGULAIR) 10 MG tablet Take 1 tablet (10 mg total) by mouth at bedtime as needed. 30 tablet 2   No facility-administered medications prior to visit.    Allergies  Allergen Reactions  . Molds & Smuts     COUGH, TIGHTNESS IN CHEST  . Other     CAT DANDER= tightness in chest, watery eyes, cough    Review of Systems  Constitutional: Negative for chills, fever and malaise/fatigue.  HENT: Negative for  congestion and hearing loss.   Eyes: Negative for discharge.  Respiratory: Negative for cough, sputum production  and shortness of breath.   Cardiovascular: Negative for chest pain, palpitations and leg swelling.  Gastrointestinal: Negative for abdominal pain, blood in stool, constipation, diarrhea, heartburn, nausea and vomiting.  Genitourinary: Negative for dysuria, frequency, hematuria and urgency.  Musculoskeletal: Negative for back pain, falls and myalgias.  Skin: Negative for rash.  Neurological: Negative for dizziness, sensory change, loss of consciousness, weakness and headaches.  Endo/Heme/Allergies: Negative for environmental allergies. Does not bruise/bleed easily.  Psychiatric/Behavioral: Negative for depression and suicidal ideas. The patient is not nervous/anxious and does not have insomnia.        Objective:    Physical Exam Constitutional:      General: She is not in acute distress.    Appearance: She is well-developed.  HENT:     Head: Normocephalic and atraumatic.  Eyes:     Conjunctiva/sclera: Conjunctivae normal.  Neck:     Thyroid: No thyromegaly.  Cardiovascular:     Rate and Rhythm: Normal rate and regular rhythm.     Heart sounds: Normal heart sounds. No murmur.  Pulmonary:     Effort: Pulmonary effort is normal. No respiratory distress.     Breath sounds: Normal breath sounds.  Abdominal:     General: Bowel sounds are normal. There is no distension.     Palpations: Abdomen is soft. There is no mass.     Tenderness: There is no abdominal tenderness.  Musculoskeletal:     Cervical back: Neck supple.  Lymphadenopathy:     Cervical: No cervical adenopathy.  Skin:    General: Skin is warm and dry.  Neurological:     Mental Status: She is alert and oriented to person, place, and time.  Psychiatric:        Behavior: Behavior normal.     BP (!) 121/57   Pulse 60   Temp (!) 95.4 F (35.2 C) (Skin)   Ht _0  (1.6 m)   Wt 133 lb 12.8 oz (60.7 kg)    SpO2 100%   BMI 23.70 kg/m  Wt Readings from Last 3 Encounters:  09/07/19 133 lb 12.8 oz (60.7 kg)  04/12/19 135 lb (61.2 kg)  04/11/19 134 lb (60.8 kg)    Diabetic Foot Exam - Simple   No data filed     Lab Results  Component Value Date   WBC 5.2 07/28/2016   HGB 13.6 07/28/2016   HCT 40.8 07/28/2016   PLT 159.0 07/28/2016   GLUCOSE 85 07/28/2016   CHOL 140 07/28/2016   TRIG 71.0 07/28/2016   HDL 52.90 07/28/2016   LDLCALC 73 07/28/2016   ALT 11 07/28/2016   AST 16 07/28/2016   NA 138 07/28/2016   K 3.7 07/28/2016   CL 104 07/28/2016   CREATININE 0.81 07/28/2016   BUN 14 07/28/2016   CO2 29 07/28/2016   TSH 1.68 07/28/2016   HGBA1C 4.9 07/28/2016    Lab Results  Component Value Date   TSH 1.68 07/28/2016   Lab Results  Component Value Date   WBC 5.2 07/28/2016   HGB 13.6 07/28/2016   HCT 40.8 07/28/2016   MCV 91.0 07/28/2016   PLT 159.0 07/28/2016   Lab Results  Component Value Date   NA 138 07/28/2016   K 3.7 07/28/2016   CO2 29 07/28/2016   GLUCOSE 85 07/28/2016   BUN 14 07/28/2016   CREATININE 0.81 07/28/2016   BILITOT 0.7 07/28/2016   ALKPHOS 51 07/28/2016   AST 16 07/28/2016   ALT 11 07/28/2016   PROT  6.8 07/28/2016   ALBUMIN 4.3 07/28/2016   CALCIUM 9.5 07/28/2016   GFR 81.06 07/28/2016   Lab Results  Component Value Date   CHOL 140 07/28/2016   Lab Results  Component Value Date   HDL 52.90 07/28/2016   Lab Results  Component Value Date   LDLCALC 73 07/28/2016   Lab Results  Component Value Date   TRIG 71.0 07/28/2016   Lab Results  Component Value Date   CHOLHDL 3 07/28/2016   Lab Results  Component Value Date   HGBA1C 4.9 07/28/2016       Assessment & Plan:   Problem List Items Addressed This Visit    Asthma    She is now under the care of [facility] and they have treated her successfully for allergy induced asthma in the spring and fall. She takes Breo and that helps      Hyperglycemia    hgba1c acceptable,  minimize simple carbs. Increase exercise as tolerated.       Relevant Orders   CBC   TSH   A1C   Preventative health care    Patient encouraged to maintain heart healthy diet, regular exercise, adequate sleep. Consider daily probiotics. Take medications as prescribed. Labs ordered and reviewed, MGM ordered      Relevant Orders   CBC   TSH   Cervical cancer screening    Pap today, no concerns on exam.       Relevant Orders   Cytology - PAP( [place])   Hyperlipemia    Encouraged heart healthy diet, increase exercise, avoid trans fats, consider a krill oil cap daily      Relevant Orders   CMP   Lipid panel   History of recurrent UTIs    Following with urology and doing well       Other Visit Diagnoses    Encounter for screening for malignant neoplasm of breast, unspecified screening modality    -  Primary   Relevant Orders   MAMMOGRAM TOMO 3-D ([facility])      I have discontinued Elmyra Ricks Hiltz's levocetirizine, montelukast, methylPREDNISolone, diclofenac, and Pennsaid. I am also having her maintain her albuterol and fluticasone.  No orders of the defined types were placed in this encounter.    Penni Homans, MD

## 2019-09-08 LAB — COMPREHENSIVE METABOLIC PANEL
ALT: 17 U/L (ref 0–35)
AST: 19 U/L (ref 0–37)
Albumin: 4.6 g/dL (ref 3.5–5.2)
Alkaline Phosphatase: 55 U/L (ref 39–117)
BUN: 15 mg/dL (ref 6–23)
CO2: 27 mEq/L (ref 19–32)
Calcium: 9.4 mg/dL (ref 8.4–10.5)
Chloride: 105 mEq/L (ref 96–112)
Creatinine, Ser: 0.94 mg/dL (ref 0.40–1.20)
GFR: 63.37 mL/min (ref >60.00)
Glucose, Bld: 83 mg/dL (ref 70–99)
Potassium: 3.7 mEq/L (ref 3.5–5.1)
Sodium: 140 mEq/L (ref 135–145)
Total Bilirubin: 0.8 mg/dL (ref 0.2–1.2)
Total Protein: 7.1 g/dL (ref 6.0–8.3)

## 2019-09-08 LAB — CBC
HCT: 42.9 % (ref 36.0–46.0)
Hemoglobin: 14.3 g/dL (ref 12.0–15.0)
MCHC: 33.4 g/dL (ref 30.0–36.0)
MCV: 92.9 fl (ref 78.0–100.0)
Platelets: 159 10*3/uL (ref 150.0–400.0)
RBC: 4.61 Mil/uL (ref 3.87–5.11)
RDW: 12.7 % (ref 11.5–15.5)
WBC: 7.1 10*3/uL (ref 4.0–10.5)

## 2019-09-08 LAB — LIPID PANEL
Cholesterol: 168 mg/dL (ref 0–200)
HDL: 50.9 mg/dL (ref >39.00)
LDL Cholesterol: 106 mg/dL — ABNORMAL HIGH (ref 0–99)
NonHDL: 117.05
Total CHOL/HDL Ratio: 3
Triglycerides: 57 mg/dL (ref 0.0–149.0)
VLDL: 11.4 mg/dL (ref 0.0–40.0)

## 2019-09-08 LAB — TSH: TSH: 2.19 u[IU]/mL (ref 0.35–4.50)

## 2019-09-08 LAB — HEMOGLOBIN A1C: Hgb A1c MFr Bld: 5 % (ref 4.6–6.5)

## 2019-09-11 LAB — CYTOLOGY - PAP
Diagnosis: NEGATIVE
Diagnosis: REACTIVE

## 2019-09-27 ENCOUNTER — Ambulatory Visit (INDEPENDENT_AMBULATORY_CARE_PROVIDER_SITE_OTHER): Payer: 59 | Admitting: Psychology

## 2019-09-27 DIAGNOSIS — F4322 Adjustment disorder with anxiety: Secondary | ICD-10-CM

## 2019-10-31 ENCOUNTER — Ambulatory Visit (INDEPENDENT_AMBULATORY_CARE_PROVIDER_SITE_OTHER): Payer: 59 | Admitting: Psychology

## 2019-10-31 DIAGNOSIS — F4322 Adjustment disorder with anxiety: Secondary | ICD-10-CM | POA: Diagnosis not present

## 2019-12-12 ENCOUNTER — Ambulatory Visit (INDEPENDENT_AMBULATORY_CARE_PROVIDER_SITE_OTHER): Payer: 59 | Admitting: Psychology

## 2019-12-12 DIAGNOSIS — F4322 Adjustment disorder with anxiety: Secondary | ICD-10-CM

## 2020-01-08 ENCOUNTER — Ambulatory Visit (INDEPENDENT_AMBULATORY_CARE_PROVIDER_SITE_OTHER): Payer: 59 | Admitting: Psychology

## 2020-01-08 DIAGNOSIS — F4322 Adjustment disorder with anxiety: Secondary | ICD-10-CM | POA: Diagnosis not present

## 2020-02-14 ENCOUNTER — Ambulatory Visit (INDEPENDENT_AMBULATORY_CARE_PROVIDER_SITE_OTHER): Payer: 59 | Admitting: Psychology

## 2020-02-14 DIAGNOSIS — F4322 Adjustment disorder with anxiety: Secondary | ICD-10-CM

## 2020-03-28 ENCOUNTER — Ambulatory Visit (INDEPENDENT_AMBULATORY_CARE_PROVIDER_SITE_OTHER): Payer: 59 | Admitting: Psychology

## 2020-03-28 DIAGNOSIS — F4322 Adjustment disorder with anxiety: Secondary | ICD-10-CM | POA: Diagnosis not present

## 2020-04-24 ENCOUNTER — Ambulatory Visit (INDEPENDENT_AMBULATORY_CARE_PROVIDER_SITE_OTHER): Payer: 59 | Admitting: Psychology

## 2020-04-24 DIAGNOSIS — F4322 Adjustment disorder with anxiety: Secondary | ICD-10-CM

## 2020-05-30 ENCOUNTER — Ambulatory Visit (INDEPENDENT_AMBULATORY_CARE_PROVIDER_SITE_OTHER): Payer: 59 | Admitting: Psychology

## 2020-05-30 DIAGNOSIS — F4322 Adjustment disorder with anxiety: Secondary | ICD-10-CM

## 2020-06-01 ENCOUNTER — Other Ambulatory Visit: Payer: Self-pay

## 2020-06-01 ENCOUNTER — Encounter (HOSPITAL_BASED_OUTPATIENT_CLINIC_OR_DEPARTMENT_OTHER): Payer: Self-pay

## 2020-06-01 ENCOUNTER — Emergency Department (HOSPITAL_BASED_OUTPATIENT_CLINIC_OR_DEPARTMENT_OTHER): Payer: 59

## 2020-06-01 ENCOUNTER — Emergency Department (HOSPITAL_BASED_OUTPATIENT_CLINIC_OR_DEPARTMENT_OTHER)
Admission: EM | Admit: 2020-06-01 | Discharge: 2020-06-01 | Disposition: A | Discharge status: Home / Self Care | Payer: 59 | Attending: Emergency Medicine | Admitting: Emergency Medicine

## 2020-06-01 DIAGNOSIS — M791 Myalgia, unspecified site: Secondary | ICD-10-CM | POA: Diagnosis not present

## 2020-06-01 DIAGNOSIS — Z79899 Other long term (current) drug therapy: Secondary | ICD-10-CM | POA: Diagnosis not present

## 2020-06-01 DIAGNOSIS — J45901 Unspecified asthma with (acute) exacerbation: Secondary | ICD-10-CM | POA: Insufficient documentation

## 2020-06-01 DIAGNOSIS — M79605 Pain in left leg: Secondary | ICD-10-CM | POA: Diagnosis present

## 2020-06-01 DIAGNOSIS — I82812 Embolism and thrombosis of superficial veins of left lower extremities: Secondary | ICD-10-CM | POA: Diagnosis not present

## 2020-06-01 DIAGNOSIS — Z7951 Long term (current) use of inhaled steroids: Secondary | ICD-10-CM | POA: Insufficient documentation

## 2020-06-01 NOTE — Discharge Instructions (Addendum)
Please follow-up with your primary care provider to schedule a follow-up appointment as well as to schedule an outpatient ultrasound for the blood clot in your left leg.  I would recommend you start taking a full dose of aspirin every day for the next 30 days.  This is 325 mg of aspirin per day.  You can continue to apply heat to the region.  Continue to move around and exercise as your pain allows.  If you develop any chest pain, shortness of breath, worsening pain and swelling in the leg, please return to the ER for reevaluation.  It was a pleasure to meet you.

## 2020-06-01 NOTE — ED Notes (Signed)
Pt discharged to home. Discharge instructions have been discussed with patient and/or family members. Pt verbally acknowledges understanding d/c instructions, and endorses comprehension to checkout at registration before leaving.  °

## 2020-06-01 NOTE — ED Provider Notes (Signed)
[facility] EMERGENCY DEPARTMENT Provider Note   CSN: [id number] Arrival date & time: 06/01/20  1209     History Chief Complaint  Patient presents with  . Leg Pain    April Bridges is a 49 y.o. female.  HPI Patient is a 49 year old female with a medical history as noted below.  Patient states that about 3 days ago she started experiencing mild, worsening, constant, left calf pain.  Symptoms seem to mildly alleviate with ambulation.  They worsen when sitting still for long periods of time.  Patient states that her son is a Electronics engineer with special needs in [place] and she has been driving back and forth to [place] on a regular basis recently.  She was evaluated at urgent care and informed to come to the emergency department for a DVT rule out.  Patient has no other physical complaints at this time.    Past Medical History:  Diagnosis Date  . Acute bronchitis 06/26/2017  . Allergic state 08/30/2012  . Anxiety state 07/01/2014  . Asthma    cats, dust, triggers  . Asthma   . Asthma exacerbation   . Atypical moles 03/03/2012  . Breast lump on right side at 10 o'clock position 03/21/2012  . Chicken pox as a child  . Gestational thrombocytopenia ([place]) 02/29/2012  . History of recurrent UTIs 07/28/2016  . Hyperglycemia 02/29/2012  . Hyperlipidemia 07/01/2013  . Migraine 06/26/2013  . Migraine 07/01/2013  . Mumps as a child  . Neck pain 07/01/2014  . Preventative health care 03/03/2012  . Skin lesion 07/01/2015   Halo lesion on back present since college Raised verrucous lesion right ankle for several years.  . Thrombocytopenia ([place]) 02/29/2012   Possible anemia/low iron? In past per patient   . Toe pain, right 02/16/2017  . UTI (lower urinary tract infection) 04/14/2012   Patient Active Problem List   Diagnosis Date Noted  . Lateral epicondylitis of left elbow 04/12/2019  . History of recurrent UTIs 07/28/2016  . Hyperlipemia 07/01/2014  . Anxiety state 07/01/2014  .  Neck pain 07/01/2014  . Migraine 06/26/2013  . Allergic state 08/30/2012  . Cervical cancer screening 03/21/2012  . Breast lump on right side at 10 o'clock position 03/21/2012  . Preventative health care 03/03/2012  . Atypical moles 03/03/2012  . Hyperglycemia 02/29/2012  . Thrombocytopenia ([place]) 02/29/2012  . Asthma     Past Surgical History:  Procedure Laterality Date  . [place], 2001,2003   X 3  . HAMMER TOE SURGERY  1992   right 5th toe  . TUBAL LIGATION       OB History   No obstetric history on file.     Family History  Problem Relation Age of Onset  . Thyroid disease Father        Hashimoto's  . Depression Sister        related to cycle  . Muscular dystrophy Son        Donnajean Lopes  . Other Son        Neurogenic bladder  . Asthma Son        mild  . Celiac disease Son   . Stroke Maternal Grandmother   . Heart attack Maternal Grandfather        ?  Marland Kitchen Heart disease Maternal Grandfather   . Diabetes Paternal Grandmother        type 2  . Hypertension Paternal Grandmother   . Obesity Paternal Grandmother   . Other  Sister        gluten intolerate or celiac, rectocele repair x 2 with bowel adhesions.  . Stroke Sister 12       s/p infections and surgeries  . Depression Sister   . Fibromyalgia Sister   . Thyroid disease Sister   . Cholelithiasis Sister   . Celiac disease Son   . Kidney disease Maternal Uncle   . Diabetes Maternal Uncle   . Diabetes Mother        2, diet controlled  . Cancer Mother        breast    Social History   Tobacco Use  . Smoking status: Never Smoker  . Smokeless tobacco: Never Used  Vaping Use  . Vaping Use: Never used  Substance Use Topics  . Alcohol use: No  . Drug use: No    Home Medications Prior to Admission medications   Medication Sig Start Date End Date Taking? Authorizing Provider  albuterol (PROVENTIL HFA;VENTOLIN HFA) 108 (90 Base) MCG/ACT inhaler Inhale 2 puffs into the lungs every 6 (six)  hours as needed for wheezing. 06/16/17 10/14/19  Lance Sell, NP  fluticasone (FLOVENT HFA) 110 MCG/ACT inhaler Inhale 2 puffs into the lungs 2 (two) times daily. 06/21/17   Saguier, Percell Miller, PA-C    Allergies    Molds & smuts and Other  Review of Systems   Review of Systems  Respiratory: Negative for shortness of breath.   Cardiovascular: Negative for chest pain.  Musculoskeletal: Positive for myalgias.  Skin: Negative for color change and wound.  Neurological: Negative for syncope, weakness, numbness and headaches.   Physical Exam Updated Vital Signs BP 129/74 (BP Location: Left Arm)   Pulse 65   Temp 98.8 F (37.1 C) (Oral)   Resp 16   Ht 5\' 3"  (1.6 m)   Wt 60.3 kg   LMP 05/18/2020   SpO2 100%   BMI 23.56 kg/m   Physical Exam Vitals and nursing note reviewed.  Constitutional:      General: She is not in acute distress.    Appearance: Normal appearance. She is not ill-appearing, toxic-appearing or diaphoretic.  HENT:     Head: Normocephalic and atraumatic.     Right Ear: External ear normal.     Left Ear: External ear normal.     Nose: Nose normal.     Mouth/Throat:     Mouth: Mucous membranes are moist.     Pharynx: Oropharynx is clear. No oropharyngeal exudate or posterior oropharyngeal erythema.  Eyes:     Extraocular Movements: Extraocular movements intact.  Cardiovascular:     Rate and Rhythm: Normal rate and regular rhythm.     Pulses: Normal pulses.     Heart sounds: Normal heart sounds. No murmur heard.  No friction rub. No gallop.      Comments: 2+ DP/PT pulses noted bilaterally.  Heart is regular rate and rhythm.  Not tachycardic. Pulmonary:     Effort: Pulmonary effort is normal. No respiratory distress.     Breath sounds: Normal breath sounds. No stridor. No wheezing, rhonchi or rales.     Comments: Lungs are clear to auscultation bilaterally. Abdominal:     General: Abdomen is flat.     Tenderness: There is no abdominal tenderness.    Musculoskeletal:        General: Tenderness present. No swelling. Normal range of motion.     Cervical back: Normal range of motion and neck supple. No tenderness.     Right  lower leg: No edema.     Left lower leg: No edema.     Comments: Mild TTP noted diffusely along the left calf.  No palpable cords.  No edema.  No erythema or ecchymosis.  Good pedal pulses.  Good cap refill.  Distal sensation intact.  Full range of motion of the knee and ankle.  Patient is able to move the toes of the left foot without difficulty.  Skin:    General: Skin is warm and dry.     Capillary Refill: Capillary refill takes less than 2 seconds.  Neurological:     General: No focal deficit present.     Mental Status: She is alert and oriented to person, place, and time.  Psychiatric:        Mood and Affect: Mood normal.        Behavior: Behavior normal.    ED Results / Procedures / Treatments   Labs (all labs ordered are listed, but only abnormal results are displayed) Labs Reviewed - No data to display  EKG None  Radiology US Venous Img Lower Unilateral Left  Result Date: 06/01/2020 CLINICAL DATA:  Left calf region pain and edema EXAM: LEFT LOWER EXTREMITY VENOUS DUPLEX ULTRASOUND TECHNIQUE: Gray-scale sonography with graded compression, as well as color Doppler and duplex ultrasound were performed to evaluate the left lower extremity deep venous system from the level of the common femoral vein and including the common femoral, femoral, profunda femoral, popliteal and calf veins including the posterior tibial, peroneal and gastrocnemius veins when visible. The superficial great saphenous vein was also interrogated. Spectral Doppler was utilized to evaluate flow at rest and with distal augmentation maneuvers in the common femoral, femoral and popliteal veins. COMPARISON:  None. FINDINGS: Contralateral Common Femoral Vein: Respiratory phasicity is normal and symmetric with the symptomatic side. No evidence of  thrombus. Normal compressibility. Common Femoral Vein: No evidence of thrombus. Normal compressibility, respiratory phasicity and response to augmentation. Saphenofemoral Junction: No evidence of thrombus. Normal compressibility and flow on color Doppler imaging. Profunda Femoral Vein: No evidence of thrombus. Normal compressibility and flow on color Doppler imaging. Femoral Vein: No evidence of thrombus. Normal compressibility, respiratory phasicity and response to augmentation. Popliteal Vein: No evidence of thrombus. Normal compressibility, respiratory phasicity and response to augmentation. Calf Veins: No evidence of thrombus. Normal compressibility and flow on color Doppler imaging. Superficial Great Saphenous Vein: No evidence of thrombus. Normal compressibility. Venous Reflux:  None. Other Findings: There is acute appearing thrombus in left gastrocnemius veins with loss of Doppler signal and absence of flow in the left calf region. Thrombus is seen near the popliteal vein but does not extend into the popliteal vein. IMPRESSION: No evidence of deep venous thrombosis in the left lower extremity. There is acute superficial venous thrombosis in the left gastrocnemius veins in the calf region. This thrombus does not extend into the popliteal vein currently. Right common femoral vein also present. Electronically Signed   By: Lowella Grip III M.D.   On: 06/01/2020 14:02   Procedures Procedures (including critical care time)  Medications Ordered in ED Medications - No data to display  ED Course  I have reviewed the triage vital signs and the nursing notes.  Pertinent labs & imaging results that were available during my care of the patient were reviewed by me and considered in my medical decision making (see chart for details).    MDM Rules/Calculators/A&P  Pt is a 49 y.o. female that presents with a history, physical exam, and ED Clinical Course as noted above.    Patient presents today with atraumatic left calf pain and swelling.  Ultrasound obtained which was negative for DVT but does show a superficial venous thrombosis.  Discussed this with the patient in length.  Also discussed this with my attending physician as well.  Recommended that the patient take 325 mg of ASA for the next 30 days as well as reach out to her PCP for reevaluation and to schedule an outpatient ultrasound in 30 days.  No chest pain or shortness of breath.  Patient is not tachycardic or hypoxic.  No history of known malignancy.  No history of blood clot.  Recommended continued use of heat on the region as well as continued movement, which appears to be helping her pain.  She understands that if she develops any new or worsening symptoms such as chest pain, shortness of breath, or worsening leg swelling, she needs to return to the ER for immediate reevaluation.  Patient is hemodynamically stable and in NAD at the time of d/c. Evaluation does not show pathology that would require ongoing emergent intervention or inpatient treatment. I explained the diagnosis to the patient. Patient is comfortable with above plan and is stable for discharge at this time. All questions were answered prior to disposition. Strict return precautions for returning to the ED were discussed. Encouraged follow up with PCP.    An After Visit Summary was printed and given to the patient.  Patient discharged to home/self care.  Condition at discharge: Stable  Note: Portions of this report may have been transcribed using voice recognition software. Every effort was made to ensure accuracy; however, inadvertent computerized transcription errors may be present.   Final Clinical Impression(s) / ED Diagnoses Final diagnoses:  Acute superficial venous thrombosis of left lower extremity   Rx / DC Orders ED Discharge Orders    None       Rayna Sexton, PA-C 06/01/20 1618    Drenda Freeze,  MD 06/02/20 1458

## 2020-06-01 NOTE — ED Triage Notes (Signed)
Pt arrives with pain to left calf starting Wednesday night states that she has been driving a lot recently was seen at [facility] and sent here for [place] to rule out DVT.

## 2020-06-03 ENCOUNTER — Telehealth: Payer: Self-pay | Admitting: Family Medicine

## 2020-06-03 NOTE — Telephone Encounter (Signed)
Patient had [place] on Saturday and was told to schedule a followup US in a month. Patient states she is only in town on Thursdays and Fridays. Please place order and call patient to schedule.

## 2020-06-03 NOTE — Telephone Encounter (Signed)
Patient has been notified and has been scheduled for ED Followup within 30 days and scheduled with Mackie Pai, PA-C on 10/14@9  am for reevaluation and if needed additional SS

## 2020-06-27 ENCOUNTER — Ambulatory Visit (INDEPENDENT_AMBULATORY_CARE_PROVIDER_SITE_OTHER): Payer: 59 | Admitting: Psychology

## 2020-06-27 DIAGNOSIS — F4322 Adjustment disorder with anxiety: Secondary | ICD-10-CM | POA: Diagnosis not present

## 2020-07-04 ENCOUNTER — Ambulatory Visit: Payer: 59 | Admitting: Medical

## 2020-08-08 ENCOUNTER — Ambulatory Visit (INDEPENDENT_AMBULATORY_CARE_PROVIDER_SITE_OTHER): Payer: 59 | Admitting: Psychology

## 2020-08-08 DIAGNOSIS — F4322 Adjustment disorder with anxiety: Secondary | ICD-10-CM | POA: Diagnosis not present

## 2020-09-25 ENCOUNTER — Ambulatory Visit (INDEPENDENT_AMBULATORY_CARE_PROVIDER_SITE_OTHER): Payer: 59 | Admitting: Psychology

## 2020-09-25 DIAGNOSIS — F4322 Adjustment disorder with anxiety: Secondary | ICD-10-CM

## 2020-10-29 ENCOUNTER — Ambulatory Visit (INDEPENDENT_AMBULATORY_CARE_PROVIDER_SITE_OTHER): Payer: 59 | Admitting: Psychology

## 2020-10-29 DIAGNOSIS — F4322 Adjustment disorder with anxiety: Secondary | ICD-10-CM

## 2020-11-22 ENCOUNTER — Ambulatory Visit (INDEPENDENT_AMBULATORY_CARE_PROVIDER_SITE_OTHER): Payer: 59 | Admitting: Family Medicine

## 2020-11-22 ENCOUNTER — Encounter: Payer: Self-pay | Admitting: Family Medicine

## 2020-11-22 ENCOUNTER — Other Ambulatory Visit: Payer: Self-pay | Admitting: Family Medicine

## 2020-11-22 ENCOUNTER — Other Ambulatory Visit: Payer: Self-pay

## 2020-11-22 VITALS — BP 118/76 | HR 77 | Temp 97.8°F | Resp 16 | Ht 63.0 in | Wt 132.0 lb

## 2020-11-22 DIAGNOSIS — N390 Urinary tract infection, site not specified: Secondary | ICD-10-CM

## 2020-11-22 DIAGNOSIS — B079 Viral wart, unspecified: Secondary | ICD-10-CM | POA: Diagnosis not present

## 2020-11-22 DIAGNOSIS — D489 Neoplasm of uncertain behavior, unspecified: Secondary | ICD-10-CM

## 2020-11-22 MED ORDER — CEPHALEXIN 500 MG PO CAPS: ORAL_CAPSULE | ORAL | 2 refills | Status: AC

## 2020-11-22 NOTE — Patient Instructions (Addendum)
Stay hydrated.  Do not shower for the rest of the day. When you do wash it, use only soap and water. Do not vigorously scrub. Apply triple antibiotic ointment (like Neosporin) twice daily. Keep the area clean and dry.   Things to look out for: increasing pain not relieved by ibuprofen/acetaminophen, fevers, spreading redness, drainage of pus, or foul odor.  Give [place] a week to get the results of your skin biopsy back.   Let us know if you need anything.

## 2020-11-22 NOTE — Progress Notes (Signed)
Chief Complaint  Patient presents with  . Frequent UTI    Refill on medication cephalexin 500mg -takes after intercourse. Urology Dr. Has left practice.    . skin lesion    Lower back skin lesion    Subjective: Patient is a 50 y.o. female here for f/u recurrent UTI.  The patient has a history of recurrent UTIs after intercourse.  She usually takes 500 mg of cephalexin after intercourse.  This typically works well.  She also voids and make sure to stay well-hydrated.  She has no current UTI symptoms.  1 yr of a lesion on her lower back region. No pain, drainage.  Denies new topicals or bleeding.  It will sometimes scale and itch.  She has not tried anything so far.  No personal or family history of skin cancer.  It does not seem to be changing.  Past Medical History:  Diagnosis Date  . Acute bronchitis 06/26/2017  . Allergic state 08/30/2012  . Anxiety state 07/01/2014  . Asthma    cats, dust, triggers  . Asthma   . Asthma exacerbation   . Atypical moles 03/03/2012  . Breast lump on right side at 10 o'clock position 03/21/2012  . Chicken pox as a child  . Gestational thrombocytopenia ([place]) 02/29/2012  . History of recurrent UTIs 07/28/2016  . Hyperglycemia 02/29/2012  . Hyperlipidemia 07/01/2013  . Migraine 06/26/2013  . Migraine 07/01/2013  . Mumps as a child  . Neck pain 07/01/2014  . Preventative health care 03/03/2012  . Skin lesion 07/01/2015   Halo lesion on back present since college Raised verrucous lesion right ankle for several years.  . Thrombocytopenia ([place]) 02/29/2012   Possible anemia/low iron? In past per patient   . Toe pain, right 02/16/2017  . UTI (lower urinary tract infection) 04/14/2012    Objective: BP 118/76 (BP Location: Left Arm, Patient Position: Sitting, Cuff Size: Normal)   Pulse 77   Temp 97.8 F (36.6 C)   Resp 16   Ht 5\' 3"  (1.6 m)   Wt 132 lb (59.9 kg)   SpO2 99%   BMI 23.38 kg/m  General: Awake, appears stated age Skin: In the lumbar region  just off to the left side, there is a slightly scaly and raised pinkish lesion measuring 0.4 x 0.6 cm in diameter.  There is no erythema, tenderness to palpation, fluctuance, or drainage. Lungs: No accessory muscle use Psych: Age appropriate judgment and insight, normal affect and mood  Procedure note; shave biopsy Informed consent was obtained. Therapeutic and diagnostic.  The area was cleaned with alcohol and injected with 0.5 mL of 1% lidocaine with epinephrine. A Dermablade was slightly bent and used to cut under the area of interest. The specimen was placed in a sterile specimen cup and sent to the lab. The area was then cauterized ensuring adequate hemostasis. The area was dressed with triple antibiotic ointment and a bandage. There were no complications noted. The patient tolerated the procedure well.  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  Continue postcoital Keflex 500 mg.  Stay hydrated. 2.  Shave biopsy today, aftercare instructions verbalized and written down.  Warning signs and symptoms verbalized and written down.  Await biopsy results. Follow-up at convenience for a physical with Dr. Charlett Blake. The patient voiced understanding and agreement to the plan.  [place], DO  11/22/20  11:37 AM

## 2020-11-25 ENCOUNTER — Ambulatory Visit: Payer: 59 | Admitting: Family Medicine

## 2020-12-10 ENCOUNTER — Ambulatory Visit (INDEPENDENT_AMBULATORY_CARE_PROVIDER_SITE_OTHER): Payer: 59 | Admitting: Psychology

## 2020-12-10 DIAGNOSIS — F4322 Adjustment disorder with anxiety: Secondary | ICD-10-CM

## 2021-01-15 ENCOUNTER — Ambulatory Visit (INDEPENDENT_AMBULATORY_CARE_PROVIDER_SITE_OTHER): Payer: 59 | Admitting: Psychology

## 2021-01-15 DIAGNOSIS — F4322 Adjustment disorder with anxiety: Secondary | ICD-10-CM | POA: Diagnosis not present

## 2021-02-20 ENCOUNTER — Ambulatory Visit (INDEPENDENT_AMBULATORY_CARE_PROVIDER_SITE_OTHER): Payer: 59 | Admitting: Psychology

## 2021-02-20 DIAGNOSIS — F4322 Adjustment disorder with anxiety: Secondary | ICD-10-CM

## 2021-03-29 LAB — HM MAMMOGRAPHY

## 2021-04-08 ENCOUNTER — Ambulatory Visit (INDEPENDENT_AMBULATORY_CARE_PROVIDER_SITE_OTHER): Payer: 59 | Admitting: Psychology

## 2021-04-08 DIAGNOSIS — F4322 Adjustment disorder with anxiety: Secondary | ICD-10-CM | POA: Diagnosis not present

## 2021-04-16 ENCOUNTER — Telehealth: Payer: Self-pay | Admitting: Family Medicine

## 2021-04-16 NOTE — Telephone Encounter (Signed)
[place] with me, if approved by Dr. Charlett Blake. TOC can be worked in earlier. Just make sure not in the same session (am/pm) as a new patient.   Thanks.

## 2021-04-16 NOTE — Telephone Encounter (Signed)
Please advise 

## 2021-04-16 NOTE — Telephone Encounter (Signed)
Patient would like to April Bridges from Dr. Charlett Blake to Dr. Raoul Pitch due to [name] office being closer to patient's home. Please advise if ok to schedule. Patient is aware of wait until December for [name] appt.

## 2021-04-16 NOTE — Telephone Encounter (Signed)
Please assist pt with scheduling

## 2021-04-17 LAB — HM PAP SMEAR

## 2021-05-05 NOTE — Telephone Encounter (Signed)
Spoke to patient about scheduling TOC appt with Dr. Raoul Pitch. She will call back when she has her schedule in October fall break.   Per Dr. Raoul Pitch she can scheduled on same day another new patient OR TOC visit, appts cannot be in same session (AM/PM)

## 2021-05-20 ENCOUNTER — Ambulatory Visit (INDEPENDENT_AMBULATORY_CARE_PROVIDER_SITE_OTHER): Payer: 59 | Admitting: Psychology

## 2021-05-20 DIAGNOSIS — F4322 Adjustment disorder with anxiety: Secondary | ICD-10-CM

## 2021-06-23 ENCOUNTER — Ambulatory Visit (INDEPENDENT_AMBULATORY_CARE_PROVIDER_SITE_OTHER): Payer: 59 | Admitting: Psychology

## 2021-06-23 DIAGNOSIS — F422 Mixed obsessional thoughts and acts: Secondary | ICD-10-CM | POA: Diagnosis not present

## 2021-08-06 ENCOUNTER — Ambulatory Visit (INDEPENDENT_AMBULATORY_CARE_PROVIDER_SITE_OTHER): Payer: 59 | Admitting: Psychology

## 2021-08-06 DIAGNOSIS — F4322 Adjustment disorder with anxiety: Secondary | ICD-10-CM

## 2021-08-27 ENCOUNTER — Encounter: Payer: Self-pay | Admitting: Family Medicine

## 2021-09-08 ENCOUNTER — Other Ambulatory Visit: Payer: Self-pay

## 2021-09-08 ENCOUNTER — Ambulatory Visit (INDEPENDENT_AMBULATORY_CARE_PROVIDER_SITE_OTHER): Payer: Managed Care, Other (non HMO) | Admitting: Family Medicine

## 2021-09-08 ENCOUNTER — Encounter: Payer: Self-pay | Admitting: Family Medicine

## 2021-09-08 VITALS — BP 118/70 | HR 71 | Temp 98.3°F | Ht 63.5 in | Wt 138.0 lb

## 2021-09-08 DIAGNOSIS — Z23 Encounter for immunization: Secondary | ICD-10-CM

## 2021-09-08 DIAGNOSIS — E782 Mixed hyperlipidemia: Secondary | ICD-10-CM

## 2021-09-08 DIAGNOSIS — N390 Urinary tract infection, site not specified: Secondary | ICD-10-CM

## 2021-09-08 DIAGNOSIS — R739 Hyperglycemia, unspecified: Secondary | ICD-10-CM

## 2021-09-08 DIAGNOSIS — Z0000 Encounter for general adult medical examination without abnormal findings: Secondary | ICD-10-CM

## 2021-09-08 DIAGNOSIS — Z7689 Persons encountering health services in other specified circumstances: Secondary | ICD-10-CM

## 2021-09-08 DIAGNOSIS — D696 Thrombocytopenia, unspecified: Secondary | ICD-10-CM

## 2021-09-08 DIAGNOSIS — J45909 Unspecified asthma, uncomplicated: Secondary | ICD-10-CM

## 2021-09-08 DIAGNOSIS — Z1211 Encounter for screening for malignant neoplasm of colon: Secondary | ICD-10-CM

## 2021-09-08 NOTE — Patient Instructions (Signed)
Great to meet you today.  I have refilled the medication(s) we provide.   If labs were collected, we will inform you of lab results once received either by echart message or telephone call.   - echart message- for normal results that have been seen by the patient already.   - telephone call: abnormal results or if patient has not viewed results in their echart. Please schedule a nurse visit in April to have your shingrix series completed. It is a series of 2. It never needs repeated.   Health Maintenance, Female Adopting a healthy lifestyle and getting preventive care are important in promoting health and wellness. Ask your health care provider about: The right schedule for you to have regular tests and exams. Things you can do on your own to prevent diseases and keep yourself healthy. What should I know about diet, weight, and exercise? Eat a healthy diet  Eat a diet that includes plenty of vegetables, fruits, low-fat dairy products, and lean protein. Do not eat a lot of foods that are high in solid fats, added sugars, or sodium. Maintain a healthy weight Body mass index (BMI) is used to identify weight problems. It estimates body fat based on height and weight. Your health care provider can help determine your BMI and help you achieve or maintain a healthy weight. Get regular exercise Get regular exercise. This is one of the most important things you can do for your health. Most adults should: Exercise for at least 150 minutes each week. The exercise should increase your heart rate and make you sweat (moderate-intensity exercise). Do strengthening exercises at least twice a week. This is in addition to the moderate-intensity exercise. Spend less time sitting. Even light physical activity can be beneficial. Watch cholesterol and blood lipids Have your blood tested for lipids and cholesterol at 50 years of age, then have this test every 5 years. Have your cholesterol levels checked more  often if: Your lipid or cholesterol levels are high. You are older than 50 years of age. You are at high risk for heart disease. What should I know about cancer screening? Depending on your health history and family history, you may need to have cancer screening at various ages. This may include screening for: Breast cancer. Cervical cancer. Colorectal cancer. Skin cancer. Lung cancer. What should I know about heart disease, diabetes, and high blood pressure? Blood pressure and heart disease High blood pressure causes heart disease and increases the risk of stroke. This is more likely to develop in people who have high blood pressure readings or are overweight. Have your blood pressure checked: Every 3-5 years if you are 20-84 years of age. Every year if you are 10 years old or older. Diabetes Have regular diabetes screenings. This checks your fasting blood sugar level. Have the screening done: Once every three years after age 17 if you are at a normal weight and have a low risk for diabetes. More often and at a younger age if you are overweight or have a high risk for diabetes. What should I know about preventing infection? Hepatitis B If you have a higher risk for hepatitis B, you should be screened for this virus. Talk with your health care provider to find out if you are at risk for hepatitis B infection. Hepatitis C Testing is recommended for: Everyone born from [birth year] through 1965. Anyone with known risk factors for hepatitis C. Sexually transmitted infections (STIs) Get screened for STIs, including gonorrhea and chlamydia, if: You  are sexually active and are younger than 50 years of age. You are older than 50 years of age and your health care provider tells you that you are at risk for this type of infection. Your sexual activity has changed since you were last screened, and you are at increased risk for chlamydia or gonorrhea. Ask your health care provider if you are at  risk. Ask your health care provider about whether you are at high risk for HIV. Your health care provider may recommend a prescription medicine to help prevent HIV infection. If you choose to take medicine to prevent HIV, you should first get tested for HIV. You should then be tested every 3 months for as long as you are taking the medicine. Pregnancy If you are about to stop having your period (premenopausal) and you may become pregnant, seek counseling before you get pregnant. Take 400 to 800 micrograms (mcg) of folic acid every day if you become pregnant. Ask for birth control (contraception) if you want to prevent pregnancy. Osteoporosis and menopause Osteoporosis is a disease in which the bones lose minerals and strength with aging. This can result in bone fractures. If you are 70 years old or older, or if you are at risk for osteoporosis and fractures, ask your health care provider if you should: Be screened for bone loss. Take a calcium or vitamin D supplement to lower your risk of fractures. Be given hormone replacement therapy (HRT) to treat symptoms of menopause. Follow these instructions at home: Alcohol use Do not drink alcohol if: Your health care provider tells you not to drink. You are pregnant, may be pregnant, or are planning to become pregnant. If you drink alcohol: Limit how much you have to: 0-1 drink a day. Know how much alcohol is in your drink. In the [place] one drink equals one 12 oz bottle of beer (355 mL), one 5 oz glass of wine (148 mL), or one 1 oz glass of hard liquor (44 mL). Lifestyle Do not use any products that contain nicotine or tobacco. These products include cigarettes, chewing tobacco, and vaping devices, such as e-cigarettes. If you need help quitting, ask your health care provider. Do not use street drugs. Do not share needles. Ask your health care provider for help if you need support or information about quitting drugs. General instructions Schedule  regular health, dental, and eye exams. Stay current with your vaccines. Tell your health care provider if: You often feel depressed. You have ever been abused or do not feel safe at home. Summary Adopting a healthy lifestyle and getting preventive care are important in promoting health and wellness. Follow your health care provider's instructions about healthy diet, exercising, and getting tested or screened for diseases. Follow your health care provider's instructions on monitoring your cholesterol and blood pressure. This information is not intended to replace advice given to you by your health care provider. Make sure you discuss any questions you have with your health care provider. Document Revised: 01/27/2021 Document Reviewed: 01/27/2021 Elsevier Patient Education  [place].

## 2021-09-08 NOTE — Progress Notes (Signed)
[name], female  DOB: 20-Apr-1971, 50 y.o.   MRN: [id number] Patient Care Team    Relationship Specialty Notifications Start End  Ma Hillock, DO PCP - General Family Medicine  09/08/21   Sindy Messing, MD Referring Physician Pulmonary Disease  09/08/21   Gwendel Hanson, MD Referring Physician Urology  09/08/21   Hartford Poli, MD Referring Physician Hematology and Oncology  09/08/21   Dian Queen, MD Consulting Physician Obstetrics and Gynecology  09/08/21     Chief Complaint  Patient presents with   Transfer of Care    CPE; pt is not fasting    Subjective: April Bridges is a 50 y.o.  female present for TOC-CPE All past medical history, surgical history, allergies, family history, immunizations, medications and social history were updated in the electronic medical record today. All recent labs, ED visits and hospitalizations within the last year were reviewed.  Health maintenance:  Colonoscopy: no fhx. Pt prefers cologuard testing today.  Mammogram: fhx breast cancer in mother. completed:03/2021- gyn Cervical cancer screening: last pap: 03/2021- GYN Immunizations: tdap due next year, Influenza declined (encouraged yearly), zostavax #1 completed today (#2 by nurse visit 2-6 mos for #2), covid completed Infectious disease screening: HIV and  Hep C declined DEXA: routine screen at 60-65 Assistive device: none Oxygen OIZ:TIWP Patient has a Dental home. Hospitalizations/ED visits:reviewed  Depression screen [facility] 2/9 09/08/2021 09/07/2019 06/25/2017  Decreased Interest 0 0 0  Down, Depressed, Hopeless 0 0 0  PHQ - 2 Score 0 0 0   No flowsheet data found.      Fall Risk  09/07/2019 06/25/2017  Falls in the past year? 0 No    Immunization History  Administered Date(s) Administered   Influenza Inj Mdck Quad Pf 10/03/2018   Influenza Split 06/16/2012   Influenza, Seasonal, Injecte, Preservative Fre 10/03/2017, 10/03/2018    Influenza,inj,Quad PF,6+ Mos 06/26/2013, 07/28/2016, 10/03/2017   Influenza,inj,quad, With Preservative 10/03/2018   Influenza-Unspecified 10/03/2018   Moderna Sars-Covid-2 Vaccination 04/05/2020, 05/03/2020   PPD Test 04/11/2019   Td 09/21/2005   Tdap 06/16/2012   Zoster Recombinat (Shingrix) 09/08/2021    No results found.  Past Medical History:  Diagnosis Date   Allergies    Anxiety state 07/01/2014   Asthma    cats, dust, triggers   Atypical moles 03/03/2012   Breast lump on right side at 10 o'clock position 03/21/2012   Chicken pox as a child   Gestational thrombocytopenia ([place]) 02/29/2012   History of recurrent UTIs 07/28/2016   Hx of blood clots    in leg   Hyperglycemia 02/29/2012   Hyperlipidemia 07/01/2013   Lateral epicondylitis of left elbow 04/12/2019   Migraine 06/26/2013   Mumps as a child   Neck pain 07/01/2014   Allergies  Allergen Reactions   Molds & Smuts     COUGH, TIGHTNESS IN CHEST   Other     CAT DANDER= tightness in chest, watery eyes, cough   Past Surgical History:  Procedure Laterality Date   CESAREAN SECTION  1999, 2001,2003   X 3   HAMMER TOE SURGERY  1992   right 5th toe   TUBAL LIGATION     Family History  Problem Relation Age of Onset   Hypertension Mother    Diabetes Mother        2, diet controlled   Breast cancer Mother    Thyroid disease Father        Hashimoto's   Hypertension  Sister    Depression Sister        related to cycle   Other Sister        gluten intolerate or celiac, rectocele repair x 2 with bowel adhesions.   Stroke Sister 19       s/p infections and surgeries   Depression Sister    Fibromyalgia Sister    Thyroid disease Sister    Cholelithiasis Sister    Muscular dystrophy Son        April Bridges   Other Son        Neurogenic bladder   Asthma Son        mild   Celiac disease Son    Celiac disease Son    Diabetes Maternal Uncle    Stroke Maternal Grandmother    Heart disease Maternal  Grandfather    Heart attack Maternal Grandfather        ?   Diabetes Paternal Grandmother        type 2   Hypertension Paternal Grandmother    Obesity Paternal Grandmother    Emphysema Paternal Grandfather        smoker   Social History   Social History Narrative   Marital status/children/pets: Married   Education/employment: Multimedia programmer, caregiver   Safety:      -Wears a bicycle helmet riding a bike: Yes     -smoke alarm in the home:Yes     - wears seatbelt: Yes     - Feels safe in their relationships: Yes       Allergies as of 09/08/2021       Reactions   Molds & Smuts    COUGH, TIGHTNESS IN CHEST   Other    CAT DANDER= tightness in chest, watery eyes, cough        Medication List        Accurate as of September 08, 2021  3:39 PM. If you have any questions, ask your nurse or doctor.          albuterol 108 (90 Base) MCG/ACT inhaler Commonly known as: VENTOLIN HFA Inhale 2 puffs into the lungs every 6 (six) hours as needed for wheezing.   cephALEXin 500 MG capsule Commonly known as: KEFLEX Take 1 capsule after intercourse.   fluticasone furoate-vilanterol 200-25 MCG/INH Aepb Commonly known as: BREO ELLIPTA Inhale into the lungs.   progesterone 100 MG capsule Commonly known as: PROMETRIUM Take 100 mg by mouth daily.        All past medical history, surgical history, allergies, family history, immunizations andmedications were updated in the EMR today and reviewed under the history and medication portions of their EMR.    No results found for this or any previous visit (from the past 2160 hour(s)).  US Venous Img Lower Unilateral Left  Result Date: 06/01/2020 IMPRESSION: No evidence of deep venous thrombosis in the left lower extremity. There is acute superficial venous thrombosis in the left gastrocnemius veins in the calf region. This thrombus does not extend into the popliteal vein currently. Right common femoral vein also present.  Electronically Signed   By: Lowella Grip III M.D.   On: 06/01/2020 14:02   ROS 14 pt review of systems performed and negative (unless mentioned in an HPI)  Objective:  BP 118/70    Pulse 71    Temp 98.3 F (36.8 C) (Oral)    Ht 5' 3.5" (1.613 m)    Wt 138 lb (62.6 kg)    SpO2 98%  BMI 24.06 kg/m   Physical Exam Constitutional:      General: She is not in acute distress.    Appearance: Normal appearance. She is not ill-appearing or toxic-appearing.  HENT:     Head: Normocephalic and atraumatic.     Right Ear: Tympanic membrane, ear canal and external ear normal. There is no impacted cerumen.     Left Ear: Tympanic membrane, ear canal and external ear normal. There is no impacted cerumen.     Nose: No congestion or rhinorrhea.     Mouth/Throat:     Mouth: Mucous membranes are moist.     Pharynx: Oropharynx is clear. No oropharyngeal exudate or posterior oropharyngeal erythema.  Eyes:     General: No scleral icterus.       Right eye: No discharge.        Left eye: No discharge.     Extraocular Movements: Extraocular movements intact.     Conjunctiva/sclera: Conjunctivae normal.     Pupils: Pupils are equal, round, and reactive to light.  Cardiovascular:     Rate and Rhythm: Normal rate and regular rhythm.     Pulses: Normal pulses.     Heart sounds: Normal heart sounds. No murmur heard.   No friction rub. No gallop.  Pulmonary:     Effort: Pulmonary effort is normal. No respiratory distress.     Breath sounds: Normal breath sounds. No stridor. No wheezing, rhonchi or rales.  Chest:     Chest wall: No tenderness.  Abdominal:     General: Abdomen is flat. Bowel sounds are normal. There is no distension.     Palpations: Abdomen is soft. There is no mass.     Tenderness: There is no abdominal tenderness. There is no right CVA tenderness, left CVA tenderness, guarding or rebound.     Hernia: No hernia is present.  Musculoskeletal:        General: No swelling, tenderness  or deformity. Normal range of motion.     Cervical back: Normal range of motion and neck supple. No rigidity or tenderness.     Right lower leg: No edema.     Left lower leg: No edema.  Lymphadenopathy:     Cervical: No cervical adenopathy.  Skin:    General: Skin is warm and dry.     Coloration: Skin is not jaundiced or pale.     Findings: No bruising, erythema, lesion or rash.  Neurological:     General: No focal deficit present.     Mental Status: She is alert and oriented to person, place, and time. Mental status is at baseline.     Cranial Nerves: No cranial nerve deficit.     Sensory: No sensory deficit.     Motor: No weakness.     Coordination: Coordination normal.     Gait: Gait normal.     Deep Tendon Reflexes: Reflexes normal.  Psychiatric:        Mood and Affect: Mood normal.        Behavior: Behavior normal.        Thought Content: Thought content normal.        Judgment: Judgment normal.     Assessment/plan: Idonia Zollinger is a 50 y.o. female present for TOC-CPE Hyperglycemia - Hemoglobin A1c Thrombocytopenia (HCC) - CBC Mixed hyperlipidemia Diet controlled. - Comp Met (CMET) - TSH - Lipid panel Uncomplicated asthma, unspecified asthma severity, unspecified whether persistent Est with pulm Recurrent UTI Est with urology> prescribed ost coital keflex Colon  cancer screening - Cologuard Need for vaccination for zoster Nurse visit in APril to complete series of 2.  - Varicella-zoster vaccine IM Routine general medical examination at a health care facility/Establishing care with new doctor, encounter for Colonoscopy: no fhx. Pt prefers cologuard testing today.  Mammogram: fhx breast cancer in mother. completed:03/2021- gyn Cervical cancer screening: last pap: 03/2021- GYN Immunizations: tdap due next year, Influenza declined (encouraged yearly), zostavax #1 completed today (#2 by nurse visit 2-6 mos for #2), covid completed Infectious disease screening: HIV  and  Hep C declined DEXA: routine screen at 60-65 Patient was encouraged to exercise greater than 150 minutes a week. Patient was encouraged to choose a diet filled with fresh fruits and vegetables, and lean meats. AVS provided to patient today for education/recommendation on gender specific health and safety maintenance.  Return in about 1 year (around 09/09/2022) for  , CPE (30 min) and nurse visit in 3-6 mos for shingrix #2.  Orders Placed This Encounter  Procedures   Varicella-zoster vaccine IM   CBC   Comp Met (CMET)   TSH   Lipid panel   Hemoglobin A1c   Cologuard   No orders of the defined types were placed in this encounter.  Referral Orders  No referral(s) requested today     Note is dictated utilizing voice recognition software. Although note has been proof read prior to signing, occasional typographical errors still can be missed. If any questions arise, please do not hesitate to call for verification.  Electronically signed by: Howard Pouch, DO [place]

## 2021-09-09 LAB — COMPREHENSIVE METABOLIC PANEL
AG Ratio: 1.7 (calc) (ref 1.0–2.5)
ALT: 30 U/L — ABNORMAL HIGH (ref 6–29)
AST: 24 U/L (ref 10–35)
Albumin: 4.1 g/dL (ref 3.6–5.1)
Alkaline phosphatase (APISO): 57 U/L (ref 37–153)
BUN: 12 mg/dL (ref 7–25)
CO2: 23 mmol/L (ref 20–32)
Calcium: 9.3 mg/dL (ref 8.6–10.4)
Chloride: 104 mmol/L (ref 98–110)
Creat: 0.87 mg/dL (ref 0.50–1.03)
Globulin: 2.4 g/dL (calc) (ref 1.9–3.7)
Glucose, Bld: 81 mg/dL (ref 65–99)
Potassium: 4 mmol/L (ref 3.5–5.3)
Sodium: 139 mmol/L (ref 135–146)
Total Bilirubin: 0.5 mg/dL (ref 0.2–1.2)
Total Protein: 6.5 g/dL (ref 6.1–8.1)

## 2021-09-09 LAB — LIPID PANEL
Cholesterol: 151 mg/dL (ref <200)
HDL: 47 mg/dL — ABNORMAL LOW (ref >=50)
LDL Cholesterol (Calc): 80 mg/dL (calc)
Non-HDL Cholesterol (Calc): 104 mg/dL (calc) (ref <130)
Total CHOL/HDL Ratio: 3.2 (calc) (ref <5.0)
Triglycerides: 140 mg/dL (ref <150)

## 2021-09-09 LAB — CBC
HCT: 36.5 % (ref 35.0–45.0)
Hemoglobin: 13 g/dL (ref 11.7–15.5)
MCH: 32.3 pg (ref 27.0–33.0)
MCHC: 35.6 g/dL (ref 32.0–36.0)
MCV: 90.6 fL (ref 80.0–100.0)
Platelets: 151 10*3/uL (ref 140–400)
RBC: 4.03 10*6/uL (ref 3.80–5.10)
RDW: 12.9 % (ref 11.0–15.0)
WBC: 8 10*3/uL (ref 3.8–10.8)

## 2021-09-09 LAB — HEMOGLOBIN A1C
Hgb A1c MFr Bld: 4.8 % of total Hgb (ref <5.7)
Mean Plasma Glucose: 91 mg/dL
eAG (mmol/L): 5 mmol/L

## 2021-09-09 LAB — TSH: TSH: 2.42 mIU/L

## 2021-09-30 ENCOUNTER — Ambulatory Visit (INDEPENDENT_AMBULATORY_CARE_PROVIDER_SITE_OTHER): Payer: 59 | Admitting: Psychology

## 2021-09-30 DIAGNOSIS — F4322 Adjustment disorder with anxiety: Secondary | ICD-10-CM | POA: Diagnosis not present

## 2021-09-30 NOTE — Progress Notes (Signed)
[place] Counselor/Therapist Progress Note  Patient ID: Azarie Coriz, MRN: [id number],    Date: 09/30/2021  Time Spent: 10:00am-11:00am   60 minutes   Treatment Type: Individual Therapy  Reported Symptoms: anxiety  Mental Status Exam: Appearance:  Casual     Behavior: Appropriate  Motor: Normal  Speech/Language:  Normal Rate  Affect: Appropriate  Mood: normal  Thought process: normal  Thought content:   WNL  Sensory/Perceptual disturbances:   WNL  Orientation: oriented to person, place, time/date, and situation  Attention: Good  Concentration: Good  Memory: WNL  Fund of knowledge:  Good  Insight:   Good  Judgment:  Good  Impulse Control: Good   Risk Assessment: Danger to Self:  No Self-injurious Behavior: No Danger to Others: No Duty to Warn:no Physical Aggression / Violence:No  Access to Firearms a concern: No  Gang Involvement:No   Subjective:  Pt present for face-to-face individual therapy.   Pt consented to telehealth video session due to [place] 19 pandemic. Location of pt: home Location of therapist: home office.  Pt states the holidays and the break were good.  She is now back at [facility] [employer] with her son [name].  Addressed the stress of this semester.    Helped pt process thoughts and feelings.   Pt talked about her anxiety.  She has her ups and downs and is trying to use the tools she has learned in therapy. Pt talked about concerns about her friend.   Her friend relies on pt emotionally especially regarding issues with her exhusband.  It gets overwhelming for pt.   Addressed the issues and helped pt work on communication and boundary setting with her friend.   Pt talked about her concerns about her kids and worries about her parenting.  She is hard on herself and expect herself to do things perfectly.  Worked with pt on self acceptance and self compassion.    Pt talked about concerns about her mother who has cancer and has to make decisions about  treatment but has a hard time doing that.   Pt and her kids visited her parents over the holidays and she saw her mother's decline.  Addressed pt's concerns and worries.   Encouraged pt to increase self care and to do journaling.  Provided supportive therapy.    Interventions: Cognitive Behavioral Therapy and Insight-Oriented  Diagnosis: F43.22  Plan: See pt's Treatment Plan for anxiety in Therapy Charts.  (Treatment Plan Target Date: 12/10/2021) Pt is progressing toward treatment goals.   Plan to continue to see pt monthly.    Ziad Maye, LCSW

## 2021-11-04 LAB — COLOGUARD: COLOGUARD: NEGATIVE

## 2021-11-11 ENCOUNTER — Ambulatory Visit (INDEPENDENT_AMBULATORY_CARE_PROVIDER_SITE_OTHER): Payer: 59 | Admitting: Psychology

## 2021-11-11 DIAGNOSIS — F4322 Adjustment disorder with anxiety: Secondary | ICD-10-CM

## 2021-11-11 NOTE — Progress Notes (Signed)
[place] Counselor/Therapist Progress Note  Patient ID: Cynthia Cogle, MRN: [id number],    Date: 11/11/2021  Time Spent: 11:00am-12:00pm   60 minutes   Treatment Type: Individual Therapy  Reported Symptoms: anxiety  Mental Status Exam: Appearance:  Casual     Behavior: Appropriate  Motor: Normal  Speech/Language:  Normal Rate  Affect: Appropriate  Mood: normal  Thought process: normal  Thought content:   WNL  Sensory/Perceptual disturbances:   WNL  Orientation: oriented to person, place, time/date, and situation  Attention: Good  Concentration: Good  Memory: WNL  Fund of knowledge:  Good  Insight:   Good  Judgment:  Good  Impulse Control: Good   Risk Assessment: Danger to Self:  No Self-injurious Behavior: No Danger to Others: No Duty to Warn:no Physical Aggression / Violence:No  Access to Firearms a concern: No  Gang Involvement:No   Subjective:  Pt present for face-to-face individual therapy.   Pt consented to telehealth video session due to [place] 19 pandemic. Location of pt: home Location of therapist: home office.  Pt talked about reading about being raised by a highly critical parent.   Pt feels she has a lot of traits described in the article.   Pt identified with the following traits: Has a hard time trusting herself. It's hard to bounce back from mistakes. Tends to be a perfectionist. Takes a long time to complete a task.  Constantly apologize.  (Pt feels she improved on this).  Feels like she has to defend herself. Feels defensive often.   (Pt feels she has to explain herself a lot).   Has a hard time believing people like her.   Rarely takes compliments to heart.   Experience social anxiety. Has a harsh inner critic. Prone to depression.  Tend to be critical of others.   Relationships with siblings are strained.  (Pt is second guessing her relationships with her sisters.) Often overthinks things. Feels like she needs to prove  herself.   Pt wants to work on these issues.   Helped pt identify the top 2 issues she would like to work on.   She identified wanting to work on the relationship with her older sister.  There is a fundamental trust that is missing.   Pt would also like to work on tending to be critical of others.   Pt talked about her relationship with her sister.   Pt and her sister Apolonio Schneiders use to talk once a week.   Apolonio Schneiders had some life changes that impacted their relationship.  Apolonio Schneiders has been much busier at work.  Apolonio Schneiders does not call pt anymore.  Pt calls her and Apolonio Schneiders seems glad to talk with her but often gets off the phone within 20 minutes and does not follow up.  Addressed how this makes pt feel.  Pt is overthinking the relationship.  She feels like she does not know where she stands with her sister.  Addressed how pt could communicate with her sister to get clarification.  Pt acknowledged that she is afraid to hear what her sister may have to say.  Helped pt process her feelings and the relationship dynamics.   Pt talked about her dog dying.   Helped pt process her feelings and grief.   Encouraged pt to increase self care. Provided supportive therapy.    Interventions: Cognitive Behavioral Therapy and Insight-Oriented  Diagnosis: F43.22  Plan: See pt's Treatment Plan for anxiety in Therapy Charts.  (Treatment Plan Target Date: 12/10/2021) Pt is  progressing toward treatment goals.   Plan to continue to see pt monthly.    Istvan Behar, LCSW

## 2021-12-02 ENCOUNTER — Ambulatory Visit (INDEPENDENT_AMBULATORY_CARE_PROVIDER_SITE_OTHER): Payer: Managed Care, Other (non HMO)

## 2021-12-02 ENCOUNTER — Other Ambulatory Visit: Payer: Self-pay

## 2021-12-02 DIAGNOSIS — Z23 Encounter for immunization: Secondary | ICD-10-CM | POA: Diagnosis not present

## 2021-12-25 ENCOUNTER — Ambulatory Visit (INDEPENDENT_AMBULATORY_CARE_PROVIDER_SITE_OTHER): Payer: 59 | Admitting: Psychology

## 2021-12-25 DIAGNOSIS — F4322 Adjustment disorder with anxiety: Secondary | ICD-10-CM | POA: Diagnosis not present

## 2021-12-25 NOTE — Progress Notes (Signed)
[facility] Counselor Initial Adult Exam ? ?Name: April Bridges ?Date: 12/25/2021 ?MRN: [id number] ?DOB: [birth year] ?PCP: Howard Pouch A, DO ? ?Time spent: 2:00pm-2:55pm   55 minutes ? ?Guardian/Payee:  n/a   ? ?Paperwork requested: No  ? ?Reason for Visit /Presenting Problem: Pt present for face-to-face initial assessment update via phone.  Pt consents to telehealth session due to [place] 19 pandemic. ?Location of pt: home ?Location of therapist: home office.  ?Pt continues to struggle with anxiety at times.   She also has challenging family dynamics.  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 ?Reviewed pt's treatment plan for annual update.  Pt participated in setting treatment goals.  Pt wants to continue to have a safe place to talk and to improve coping skills.   Plan to continue to meet monthly.   ? ?Mental Status Exam: ?Appearance:   Casual     ?Behavior:  Appropriate  ?Motor:  Normal  ?Speech/Language:   Normal Rate  ?Affect:  Appropriate  ?Mood:  normal  ?Thought process:  normal  ?Thought content:    WNL  ?Sensory/Perceptual disturbances:    WNL  ?Orientation:  oriented to person, place, time/date, and situation  ?Attention:  Good  ?Concentration:  Good  ?Memory:  WNL  ?Fund of knowledge:   Good  ?Insight:    Good  ?Judgment:   Good  ?Impulse Control:  Good  ? ? ?Reported Symptoms:  stress ? ?Risk Assessment: ?Danger to Self:  No ?Self-injurious Behavior: No ?Danger to Others: No ?Duty to Warn:no ?Physical Aggression / Violence:No  ?Access to Firearms a concern: No  ?Gang Involvement:No  ?Patient / guardian was educated about steps to take if suicide or homicide risk level increases between visits: n/a ?While future psychiatric events cannot be accurately predicted, the patient does not currently require acute inpatient psychiatric care and does not currently meet [facility] involuntary commitment criteria. ? ?Substance Abuse History: ?Current substance abuse: No    ? ?Past  Psychiatric History:   ?Previous psychological history is significant for anxiety ?Outpatient Providers:pt has had therapy in the past. ?History of Psych Hospitalization: No  ?Psychological Testing:  n/a   ? ?Abuse History:  ?Victim of: No.,  n/a    ?Report needed: No. ?Victim of Neglect:No. ?Perpetrator of  n/a   ?Witness / Exposure to Domestic Violence: No   ?Protective Services Involvement: No  ?Witness to [employer] Violence:  No  ? ?Family History:  ?Family History  ?Problem Relation Age of Onset  ? Hypertension Mother   ? Diabetes Mother   ?     2, diet controlled  ? Breast cancer Mother   ? Thyroid disease Father   ?     Hashimoto's  ? Hypertension Sister   ? Depression Sister   ?     related to cycle  ? Other Sister   ?     gluten intolerate or celiac, rectocele repair x 2 with bowel adhesions.  ? Stroke Sister 5  ?     s/p infections and surgeries  ? Depression Sister   ? Fibromyalgia Sister   ? Thyroid disease Sister   ? Cholelithiasis Sister   ? Muscular dystrophy Son   ?     Warren Lacy Dreifuss  ? Other Son   ?     Neurogenic bladder  ? Asthma Son   ?     mild  ? Celiac disease Son   ? Celiac disease Son   ? Diabetes Maternal  Uncle   ? Stroke Maternal Grandmother   ? Heart disease Maternal Grandfather   ? Heart attack Maternal Grandfather   ?     ?  ? Diabetes Paternal Grandmother   ?     type 2  ? Hypertension Paternal Grandmother   ? Obesity Paternal Grandmother   ? Emphysema Paternal Grandfather   ?     smoker  ? ? ?Living situation: the patient lives with husband and 3 sons.  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  She has a 18 yo with muscular dystrophy .   Pt and husband have three boys ages 34, 94, and 47.   The two older boys were diagnosed with celiac disease.    ? ?Pt grew up with parents and 3 siblings.  Pt is the 3rd in birth order.  Pt feels in general her upbringing was good.   Pt feels close with family.   ? ?Sexual Orientation: Straight ? ?Relationship Status: married  ?Name of spouse / other:Married 28  years.  Pt states they are very close.  Husband is a support.  Husband is an Art gallery manager.  ?If a parent, number of children / ages:pt has 3 sons. ? ?Support Systems: spouse ?friends ? ?Financial Stress:  No  ? ?Income/Employment/Disability: Employment ? ?Military Service: No  ? ?Educational History: ?Education: college graduate ? ?Religion/Sprituality/World View: ?Protestant ? ?Any cultural differences that may affect / interfere with treatment:  not applicable  ? ?Recreation/Hobbies: spending time with family. reading ? ?Stressors: Other: concerns about son's physical challenges,  anxiety, family stress.   ? ?Strengths: Supportive Relationships, Family, Spirituality, Hopefulness, Self Advocate, and Able to Communicate Effectively ? ?Barriers:  none  ? ?Legal History: ?Pending legal issue / charges: The patient has no significant history of legal issues. ?History of legal issue / charges:  n/a ? ?Medical History/Surgical History: reviewed ?Past Medical History:  ?Diagnosis Date  ? Allergies   ? Anxiety state 07/01/2014  ? Asthma   ? cats, dust, triggers  ? Atypical moles 03/03/2012  ? Breast lump on right side at 10 o'clock position 03/21/2012  ? Chicken pox as a child  ? Gestational thrombocytopenia ([place]) 02/29/2012  ? History of recurrent UTIs 07/28/2016  ? Hx of blood clots   ? in leg  ? Hyperglycemia 02/29/2012  ? Hyperlipidemia 07/01/2013  ? Lateral epicondylitis of left elbow 04/12/2019  ? Migraine 06/26/2013  ? Mumps as a child  ? Neck pain 07/01/2014  ? ? ?Past Surgical History:  ?Procedure Laterality Date  ? [place], [id number]  ? X 3  ? [place]  ? right 5th toe  ? TUBAL LIGATION    ? ? ?Medications: ?Current Outpatient Medications  ?Medication Sig Dispense Refill  ? albuterol (PROVENTIL HFA;VENTOLIN HFA) 108 (90 Base) MCG/ACT inhaler Inhale 2 puffs into the lungs every 6 (six) hours as needed for wheezing. 1 Inhaler 1  ? cephALEXin (KEFLEX) 500 MG capsule Take 1  capsule after intercourse. 30 capsule 2  ? fluticasone furoate-vilanterol (BREO ELLIPTA) 200-25 MCG/INH AEPB Inhale into the lungs.    ? progesterone (PROMETRIUM) 100 MG capsule Take 100 mg by mouth daily.    ? ?No current facility-administered medications for this visit.  ? ? ?Allergies  ?Allergen Reactions  ? Molds & Smuts   ?  COUGH, TIGHTNESS IN CHEST  ? Other   ?  CAT DANDER= tightness in chest, watery eyes, cough  ? ? 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 ?Plan  of Care: Plan to meet monthly.  Pt is progressing toward treatment goals.  ? ?Treatment Plan (Treatment Plan Target Date: 12/26/2022) ?Client Abilities/Strengths  ?Pt is bright, engaging and motivated for therapy.  ?Client Treatment Preferences  ?Individual therapy.  ?Client Statement of Needs  ?Improve coping skills.  ?Symptoms  ?Autonomic hyperactivity (e.g., palpitations, shortness of breath, dry mouth, trouble swallowing, nausea, diarrhea). Excessive and/or unrealistic worry that is difficult to control occurring more days than not for at least 6 months about a number of events or activities. Hypervigilance (e.g., feeling constantly on edge, experiencing concentration difficulties, having trouble falling or staying asleep, exhibiting a general state of irritability). Motor tension (e.g., restlessness, tiredness, shakiness, muscle tension). ?Problems Addressed  ?Anxiety ?Goals ?1. Enhance ability to effectively cope with the full variety of life's worries and anxieties. ?2. Learn and implement coping skills that result in a reduction of anxiety and worry, and improved daily functioning. ?Objective ?Learn to accept limitations in life and commit to tolerating, rather than avoiding, unpleasant emotions while accomplishing meaningful goals. ?Target Date: 2022-12-26 Frequency: Monthly ?Progress: 40 Modality: individual ?Related Interventions ?1. Use techniques from Acceptance and Commitment Therapy to help client accept uncomfortable realities such as lack of complete  control, imperfections, and uncertainty and tolerate unpleasant emotions and thoughts in order to accomplish value-consistent goals. ?Objective ?Learn and implement problem-solving strategies for realistically [employer]

## 2022-01-22 ENCOUNTER — Ambulatory Visit (INDEPENDENT_AMBULATORY_CARE_PROVIDER_SITE_OTHER): Payer: 59 | Admitting: Psychology

## 2022-01-22 DIAGNOSIS — F4322 Adjustment disorder with anxiety: Secondary | ICD-10-CM

## 2022-01-22 NOTE — Progress Notes (Signed)
[facility] Counselor/Therapist Progress Note ? ?[name], MRN: [id number],   ? ?Date: 01/22/2022 ? ?Time Spent: 10:00am - 10:55am     55 minutes  ? ?Treatment Type: Individual Therapy ? ?Reported Symptoms: stress ? ?Mental Status Exam: ?Appearance:  Casual     ?Behavior: Appropriate  ?Motor: Normal  ?Speech/Language:  Normal Rate  ?Affect: Appropriate  ?Mood: normal  ?Thought process: normal  ?Thought content:   WNL  ?Sensory/Perceptual disturbances:   WNL  ?Orientation: oriented to person, place, time/date, and situation  ?Attention: Good  ?Concentration: Good  ?Memory: WNL  ?Fund of knowledge:  Good  ?Insight:   Good  ?Judgment:  Good  ?Impulse Control: Good  ? ?Risk Assessment: ?Danger to Self:  No ?Self-injurious Behavior: No ?Danger to Others: No ?Duty to Warn:no ?Physical Aggression / Violence:No  ?Access to Firearms a concern: No  ?Gang Involvement:No  ? ?Subjective: ?Pt present for individual therapy via phone.  Pt consents to telehealth session due to [place] 19 pandemic. ?Location of pt: home ?Location of therapist: home office.   ?Pt talked about being home now.  Her son April Bridges finished his semester at [employer] so pt gets to be home for the summer.   Pt is happy to be home.  She has a lot to do at home but can pace herself. ?Pt states she needs to have a talk with April Bridges about his plans for school.   Pt feels she can help him for one more year to finish his undergraduate degree but does not know what she can do if he wants to go to graduate school.  Pt feels anxious about having the conversation bc she does not have any answers.   Helped pt process her thoughts and feelings.   ?Pt states her husband's job is a Runner, broadcasting/film/video bc he is working so many hours and his new job is so stressful.   Pt feels worried about him which increases her anxiety.   Worked on Child psychotherapist. ?Pt talked about concerns about her mother.  [name] is noticing that pt's mother is losing mental  ability.   Addressed pt's concerns about her mother.  At this point pt's father is still able to take care of her mother.   ?Provided supportive therapy.  ? ?Interventions: Cognitive Behavioral Therapy and Insight-Oriented ? ?Diagnosis:  F43.22 ? ? ?Plan of Care: Plan to meet monthly.  Pt is progressing toward treatment goals.  ? ?Treatment Plan (Treatment Plan Target Date: 12/26/2022) ?Client Abilities/Strengths  ?Pt is bright, engaging and motivated for therapy.  ?Client Treatment Preferences  ?Individual therapy.  ?Client Statement of Needs  ?Improve coping skills.  ?Symptoms  ?Autonomic hyperactivity (e.g., palpitations, shortness of breath, dry mouth, trouble swallowing, nausea, diarrhea). Excessive and/or unrealistic worry that is difficult to control occurring more days than not for at least 6 months about a number of events or activities. Hypervigilance (e.g., feeling constantly on edge, experiencing concentration difficulties, having trouble falling or staying asleep, exhibiting a general state of irritability). Motor tension (e.g., restlessness, tiredness, shakiness, muscle tension). ?Problems Addressed  ?Anxiety ?Goals ?1. Enhance ability to effectively cope with the full variety of life's worries and anxieties. ?2. Learn and implement coping skills that result in a reduction of anxiety and worry, and improved daily functioning. ?Objective ?Learn to accept limitations in life and commit to tolerating, rather than avoiding, unpleasant emotions while accomplishing meaningful goals. ?Target Date: 2022-12-26 Frequency: Monthly ?Progress: 40 Modality: individual ?Related Interventions ?1. Use  techniques from Acceptance and Commitment Therapy to help client accept uncomfortable realities such as lack of complete control, imperfections, and uncertainty and tolerate unpleasant emotions and thoughts in order to accomplish value-consistent goals. ?Objective ?Learn and implement problem-solving strategies for  realistically addressing worries. ?Target Date: 2022-12-26 Frequency: Monthly ?Progress: 40 Modality: individual ?Related Interventions ?1. Assign the client a homework exercise in which he/she problem-solves a current problem.  review, reinforce success, and provide corrective feedback toward improvement. ?2. Teach the client problem-solving strategies involving specifically defining a problem, generating options for addressing it, evaluating the pros and cons of each option, selecting and implementing an optional action, and reevaluating and refining the action. ?Objective ?Learn and implement calming skills to reduce overall anxiety and manage anxiety symptoms. ?Target Date: 2022-12-26 Frequency: Monthly ?Progress: 40 Modality: individual ?Related Interventions ?1. Assign the client to read about progressive muscle relaxation and other calming strategies in relevant books or treatment manuals (e.g., Progressive Relaxation Training by Gwynneth Aliment and Dani Gobble; Mastery of Your Anxiety and Worry: Workbook by Beckie Busing). ?2. Assign the client homework each session in which he/she practices relaxation exercises daily, gradually applying them progressively from non-anxiety-provoking to anxiety-provoking situations; review and reinforce success while providing corrective feedback toward improvement. ?3. Teach the client calming/relaxation skills (e.g., applied relaxation, progressive muscle relaxation, cue controlled relaxation; mindful breathing; biofeedback) and how to discriminate better between relaxation and tension; teach the client how to apply these skills to his/her daily life. ?3. Reduce overall frequency, intensity, and duration of the anxiety so that daily functioning is not impaired. ?4. Resolve the core conflict that is the source of anxiety. ?5. Stabilize anxiety level while increasing ability to function on a daily basis. ?Diagnosis :    F43.22  ?Conditions For Discharge ?Achievement of treatment  goals and objectives. ? ?Jozlin Bently, LCSW ? ? ? ?

## 2022-02-20 ENCOUNTER — Ambulatory Visit (INDEPENDENT_AMBULATORY_CARE_PROVIDER_SITE_OTHER): Payer: 59 | Admitting: Psychology

## 2022-02-20 DIAGNOSIS — F4322 Adjustment disorder with anxiety: Secondary | ICD-10-CM

## 2022-02-20 NOTE — Progress Notes (Signed)
[facility] Counselor/Therapist Progress Note  [name], MRN: [id number],    Date: 02/20/2022  Time Spent: 10:00am - 10:55am     55 minutes   Treatment Type: Individual Therapy  Reported Symptoms: stress  Mental Status Exam: Appearance:  Casual     Behavior: Appropriate  Motor: Normal  Speech/Language:  Normal Rate  Affect: Appropriate  Mood: normal  Thought process: normal  Thought content:   WNL  Sensory/Perceptual disturbances:   WNL  Orientation: oriented to person, place, time/date, and situation  Attention: Good  Concentration: Good  Memory: WNL  Fund of knowledge:  Good  Insight:   Good  Judgment:  Good  Impulse Control: Good   Risk Assessment: Danger to Self:  No Self-injurious Behavior: No Danger to Others: No Duty to Warn:no Physical Aggression / Violence:No  Access to Firearms a concern: No  Gang Involvement:No   Subjective: Pt present for individual therapy via phone.  Pt consents to telehealth session due to [place] 19 pandemic. Location of pt: home Location of therapist: home office.   Pt talked about finding out that her youngest son has crohn's disease.  Pt was tearful as she talked about it.  There have been so many health issues her family has had to deal with.   Helped pt process her feelings. Pt's husband is under a lot of stress in his job and the stress of their son's diagnosis is adding to his stress.   Pt is concerned about having to tell family about her son's diagnosis.   She knows her husband's parents will throw a lot of questions at them and they are not ready for that.  Addressed how pt can manage sharing the information and the relationships.   Pt has a lot to manage with her 3 sons.  Her oldest son has muscular dystrophy , her middle son has celiac's disease and now her youngest son has crohn's disease.  Pt feels overwhelmed with all she has to manage.  Worked on Child psychotherapist. Provided supportive  therapy.   Interventions: Cognitive Behavioral Therapy and Insight-Oriented  Diagnosis:  F43.22   Plan of Care: Plan to meet monthly.  Pt is progressing toward treatment goals.   Treatment Plan (Treatment Plan Target Date: 12/26/2022) Client Abilities/Strengths  Pt is bright, engaging and motivated for therapy.  Client Treatment Preferences  Individual therapy.  Client Statement of Needs  Improve coping skills.  Symptoms  Autonomic hyperactivity (e.g., palpitations, shortness of breath, dry mouth, trouble swallowing, nausea, diarrhea). Excessive and/or unrealistic worry that is difficult to control occurring more days than not for at least 6 months about a number of events or activities. Hypervigilance (e.g., feeling constantly on edge, experiencing concentration difficulties, having trouble falling or staying asleep, exhibiting a general state of irritability). Motor tension (e.g., restlessness, tiredness, shakiness, muscle tension). Problems Addressed  Anxiety Goals 1. Enhance ability to effectively cope with the full variety of life's worries and anxieties. 2. Learn and implement coping skills that result in a reduction of anxiety and worry, and improved daily functioning. Objective Learn to accept limitations in life and commit to tolerating, rather than avoiding, unpleasant emotions while accomplishing meaningful goals. Target Date: 2022-12-26 Frequency: Monthly Progress: 40 Modality: individual Related Interventions 1. Use techniques from Acceptance and Commitment Therapy to help client accept uncomfortable realities such as lack of complete control, imperfections, and uncertainty and tolerate unpleasant emotions and thoughts in order to accomplish value-consistent goals. Objective Learn and implement problem-solving strategies for realistically  addressing worries. Target Date: 2022-12-26 Frequency: Monthly Progress: 40 Modality: individual Related Interventions 1. Assign the  client a homework exercise in which he/she problem-solves a current problem.  review, reinforce success, and provide corrective feedback toward improvement. 2. Teach the client problem-solving strategies involving specifically defining a problem, generating options for addressing it, evaluating the pros and cons of each option, selecting and implementing an optional action, and reevaluating and refining the action. Objective Learn and implement calming skills to reduce overall anxiety and manage anxiety symptoms. Target Date: 2022-12-26 Frequency: Monthly Progress: 40 Modality: individual Related Interventions 1. Assign the client to read about progressive muscle relaxation and other calming strategies in relevant books or treatment manuals (e.g., Progressive Relaxation Training by Gwynneth Aliment and Dani Gobble; Mastery of Your Anxiety and Worry: Workbook by Beckie Busing). 2. Assign the client homework each session in which he/she practices relaxation exercises daily, gradually applying them progressively from non-anxiety-provoking to anxiety-provoking situations; review and reinforce success while providing corrective feedback toward improvement. 3. Teach the client calming/relaxation skills (e.g., applied relaxation, progressive muscle relaxation, cue controlled relaxation; mindful breathing; biofeedback) and how to discriminate better between relaxation and tension; teach the client how to apply these skills to his/her daily life. 3. Reduce overall frequency, intensity, and duration of the anxiety so that daily functioning is not impaired. 4. Resolve the core conflict that is the source of anxiety. 5. Stabilize anxiety level while increasing ability to function on a daily basis. Diagnosis :    F43.22  Conditions For Discharge Achievement of treatment goals and objectives.  Miciah Covelli, LCSW

## 2022-03-26 ENCOUNTER — Ambulatory Visit (INDEPENDENT_AMBULATORY_CARE_PROVIDER_SITE_OTHER): Payer: 59 | Admitting: Psychology

## 2022-03-26 DIAGNOSIS — F4322 Adjustment disorder with anxiety: Secondary | ICD-10-CM | POA: Diagnosis not present

## 2022-03-26 NOTE — Progress Notes (Signed)
[place] Counselor/Therapist Progress Note  [name], MRN: [id number],    Date: 03/26/2022  Time Spent: 9:00am - 9:55am     55 minutes   Treatment Type: Individual Therapy  Reported Symptoms: stress  Mental Status Exam: Appearance:  Casual     Behavior: Appropriate  Motor: Normal  Speech/Language:  Normal Rate  Affect: Appropriate  Mood: normal  Thought process: normal  Thought content:   WNL  Sensory/Perceptual disturbances:   WNL  Orientation: oriented to person, place, time/date, and situation  Attention: Good  Concentration: Good  Memory: WNL  Fund of knowledge:  Good  Insight:   Good  Judgment:  Good  Impulse Control: Good   Risk Assessment: Danger to Self:  No Self-injurious Behavior: No Danger to Others: No Duty to Warn:no Physical Aggression / Violence:No  Access to Firearms a concern: No  Gang Involvement:No   Subjective: Pt present for individual therapy via phone.  Pt consents to telehealth session due to [place] 19 pandemic. Location of pt: home Location of therapist: home office.   Pt talked about the stress of dealing with her youngest son having crohn's disease.   Addressed pt's concerns. Pt talked about her family.   She has issues with her sister who has a lot of medical issues.  Addressed the relationship dynamics.  Helped pt process her feelings.   Pt is also feeling a disconnect with her older sister who she is usually very close to.   Pt's sister may move into their rental house within the next year.   Pt is concerned about how this could affect their relationship.   Pt has a lot to manage with her 3 sons.  Her oldest son has muscular dystrophy , her middle son has celiac's disease and now her youngest son has crohn's disease.  Pt feels overwhelmed with all she has to manage.  Worked on Child psychotherapist. Provided supportive therapy.   Interventions: Cognitive Behavioral Therapy and Insight-Oriented  Diagnosis:   F43.22   Plan of Care: Plan to meet monthly.  Pt is progressing toward treatment goals.   Treatment Plan (Treatment Plan Target Date: 12/26/2022) Client Abilities/Strengths  Pt is bright, engaging and motivated for therapy.  Client Treatment Preferences  Individual therapy.  Client Statement of Needs  Improve coping skills.  Symptoms  Autonomic hyperactivity (e.g., palpitations, shortness of breath, dry mouth, trouble swallowing, nausea, diarrhea). Excessive and/or unrealistic worry that is difficult to control occurring more days than not for at least 6 months about a number of events or activities. Hypervigilance (e.g., feeling constantly on edge, experiencing concentration difficulties, having trouble falling or staying asleep, exhibiting a general state of irritability). Motor tension (e.g., restlessness, tiredness, shakiness, muscle tension). Problems Addressed  Anxiety Goals 1. Enhance ability to effectively cope with the full variety of life's worries and anxieties. 2. Learn and implement coping skills that result in a reduction of anxiety and worry, and improved daily functioning. Objective Learn to accept limitations in life and commit to tolerating, rather than avoiding, unpleasant emotions while accomplishing meaningful goals. Target Date: 2022-12-26 Frequency: Monthly Progress: 40 Modality: individual Related Interventions 1. Use techniques from Acceptance and Commitment Therapy to help client accept uncomfortable realities such as lack of complete control, imperfections, and uncertainty and tolerate unpleasant emotions and thoughts in order to accomplish value-consistent goals. Objective Learn and implement problem-solving strategies for realistically addressing worries. Target Date: 2022-12-26 Frequency: Monthly Progress: 40 Modality: individual Related Interventions 1. Assign the client a homework  exercise in which he/she problem-solves a current problem.  review, reinforce  success, and provide corrective feedback toward improvement. 2. Teach the client problem-solving strategies involving specifically defining a problem, generating options for addressing it, evaluating the pros and cons of each option, selecting and implementing an optional action, and reevaluating and refining the action. Objective Learn and implement calming skills to reduce overall anxiety and manage anxiety symptoms. Target Date: 2022-12-26 Frequency: Monthly Progress: 40 Modality: individual Related Interventions 1. Assign the client to read about progressive muscle relaxation and other calming strategies in relevant books or treatment manuals (e.g., Progressive Relaxation Training by Gwynneth Aliment and Dani Gobble; Mastery of Your Anxiety and Worry: Workbook by Beckie Busing). 2. Assign the client homework each session in which he/she practices relaxation exercises daily, gradually applying them progressively from non-anxiety-provoking to anxiety-provoking situations; review and reinforce success while providing corrective feedback toward improvement. 3. Teach the client calming/relaxation skills (e.g., applied relaxation, progressive muscle relaxation, cue controlled relaxation; mindful breathing; biofeedback) and how to discriminate better between relaxation and tension; teach the client how to apply these skills to his/her daily life. 3. Reduce overall frequency, intensity, and duration of the anxiety so that daily functioning is not impaired. 4. Resolve the core conflict that is the source of anxiety. 5. Stabilize anxiety level while increasing ability to function on a daily basis. Diagnosis :    F43.22  Conditions For Discharge Achievement of treatment goals and objectives.  Jaydalee Bardwell, LCSW

## 2022-04-23 ENCOUNTER — Ambulatory Visit: Payer: 59 | Admitting: Psychology

## 2022-05-21 ENCOUNTER — Ambulatory Visit (INDEPENDENT_AMBULATORY_CARE_PROVIDER_SITE_OTHER): Payer: 59 | Admitting: Psychology

## 2022-05-21 DIAGNOSIS — F4322 Adjustment disorder with anxiety: Secondary | ICD-10-CM | POA: Diagnosis not present

## 2022-05-21 NOTE — Progress Notes (Signed)
[place] [place] Counselor/Therapist Progress Note  [name], MRN: [id number],    Date: 05/21/2022  Time Spent: 11:00am - 11:55am     55 minutes   Treatment Type: Individual Therapy  Reported Symptoms: stress  Mental Status Exam: Appearance:  Casual     Behavior: Appropriate  Motor: Normal  Speech/Language:  Normal Rate  Affect: Appropriate  Mood: normal  Thought process: normal  Thought content:   WNL  Sensory/Perceptual disturbances:   WNL  Orientation: oriented to person, place, time/date, and situation  Attention: Good  Concentration: Good  Memory: WNL  Fund of knowledge:  Good  Insight:   Good  Judgment:  Good  Impulse Control: Good   Risk Assessment: Danger to Self:  No Self-injurious Behavior: No Danger to Others: No Duty to Warn:no Physical Aggression / Violence:No  Access to Firearms a concern: No  Gang Involvement:No   Subjective: Pt present for individual therapy via phone.  Pt consents to telehealth session due to [place] 19 pandemic. Location of pt: home Location of therapist: home office.   Pt talked about being at [employer] with her oldest son.   She has to be there Monday-Thursday this semester.  Pt states a lot has happened since the last therapy session.    Pt's son had to have emergency surgery.   Addressed how stressful this was and how worried pt was about her son.  Pt's son is ok now.   Pt talked about her relationship with her inlaws.  Addressed the challenges in the relationship bc they are not in support of [employer].  Helped pt process her feelings and relationship dynamics.   Pt tends to second guess herself a lot.  Explored the origin of this and related it back to her family of origin.   Pt has a lot to manage with her 3 sons.  Her oldest son has muscular dystrophy , her middle son has celiac's disease and now her youngest son has crohn's disease.  Pt feels overwhelmed with all she has to manage.  Worked on  Child psychotherapist. Provided supportive therapy.   Interventions: Cognitive Behavioral Therapy and Insight-Oriented  Diagnosis:  F43.22   Plan of Care: Plan to meet monthly.  Pt is progressing toward treatment goals.   Treatment Plan (Treatment Plan Target Date: 12/26/2022) Client Abilities/Strengths  Pt is bright, engaging and motivated for therapy.  Client Treatment Preferences  Individual therapy.  Client Statement of Needs  Improve coping skills.  Symptoms  Autonomic hyperactivity (e.g., palpitations, shortness of breath, dry mouth, trouble swallowing, nausea, diarrhea). Excessive and/or unrealistic worry that is difficult to control occurring more days than not for at least 6 months about a number of events or activities. Hypervigilance (e.g., feeling constantly on edge, experiencing concentration difficulties, having trouble falling or staying asleep, exhibiting a general state of irritability). Motor tension (e.g., restlessness, tiredness, shakiness, muscle tension). Problems Addressed  Anxiety Goals 1. Enhance ability to effectively cope with the full variety of life's worries and anxieties. 2. Learn and implement coping skills that result in a reduction of anxiety and worry, and improved daily functioning. Objective Learn to accept limitations in life and commit to tolerating, rather than avoiding, unpleasant emotions while accomplishing meaningful goals. Target Date: 2022-12-26 Frequency: Monthly Progress: 40 Modality: individual Related Interventions 1. Use techniques from Acceptance and Commitment Therapy to help client accept uncomfortable realities such as lack of complete control, imperfections, and uncertainty and tolerate unpleasant emotions and thoughts in order to accomplish  value-consistent goals. Objective Learn and implement problem-solving strategies for realistically addressing worries. Target Date: 2022-12-26 Frequency: Monthly Progress: 40 Modality:  individual Related Interventions 1. Assign the client a homework exercise in which he/she problem-solves a current problem.  review, reinforce success, and provide corrective feedback toward improvement. 2. Teach the client problem-solving strategies involving specifically defining a problem, generating options for addressing it, evaluating the pros and cons of each option, selecting and implementing an optional action, and reevaluating and refining the action. Objective Learn and implement calming skills to reduce overall anxiety and manage anxiety symptoms. Target Date: 2022-12-26 Frequency: Monthly Progress: 40 Modality: individual Related Interventions 1. Assign the client to read about progressive muscle relaxation and other calming strategies in relevant books or treatment manuals (e.g., Progressive Relaxation Training by Gwynneth Aliment and Dani Gobble; Mastery of Your Anxiety and Worry: Workbook by Beckie Busing). 2. Assign the client homework each session in which he/she practices relaxation exercises daily, gradually applying them progressively from non-anxiety-provoking to anxiety-provoking situations; review and reinforce success while providing corrective feedback toward improvement. 3. Teach the client calming/relaxation skills (e.g., applied relaxation, progressive muscle relaxation, cue controlled relaxation; mindful breathing; biofeedback) and how to discriminate better between relaxation and tension; teach the client how to apply these skills to his/her daily life. 3. Reduce overall frequency, intensity, and duration of the anxiety so that daily functioning is not impaired. 4. Resolve the core conflict that is the source of anxiety. 5. Stabilize anxiety level while increasing ability to function on a daily basis. Diagnosis :    F43.22  Conditions For Discharge Achievement of treatment goals and objectives.  Jenisse Vullo, LCSW

## 2022-06-18 ENCOUNTER — Ambulatory Visit (INDEPENDENT_AMBULATORY_CARE_PROVIDER_SITE_OTHER): Payer: 59 | Admitting: Psychology

## 2022-06-18 DIAGNOSIS — F4322 Adjustment disorder with anxiety: Secondary | ICD-10-CM | POA: Diagnosis not present

## 2022-06-18 NOTE — Progress Notes (Signed)
[place] Counselor/Therapist Progress Note  [name], MRN: [id number],    Date: 06/18/2022  Time Spent: 10:00am - 10:55am     55 minutes   Treatment Type: Individual Therapy  Reported Symptoms: stress  Mental Status Exam: Appearance:  Casual     Behavior: Appropriate  Motor: Normal  Speech/Language:  Normal Rate  Affect: Appropriate  Mood: normal  Thought process: normal  Thought content:   WNL  Sensory/Perceptual disturbances:   WNL  Orientation: oriented to person, place, time/date, and situation  Attention: Good  Concentration: Good  Memory: WNL  Fund of knowledge:  Good  Insight:   Good  Judgment:  Good  Impulse Control: Good   Risk Assessment: Danger to Self:  No Self-injurious Behavior: No Danger to Others: No Duty to Warn:no Physical Aggression / Violence:No  Access to Firearms a concern: No  Gang Involvement:No   Subjective: Pt present for individual therapy via phone.  Pt consents to telehealth session due to [place] 19 pandemic. Location of pt: home Location of therapist: home office.   Pt talked about  having a busy weekend planned to support a friend whose son is getting married.   Pt talked about her son April Bridges having a "meltdown" this week bc of the stress he has been under in college.  April Bridges has a severe physical disability which impacts him as well.  Addressed how April Bridges communicated his thoughts and feelings and how it impacted pt.  Helped pt with strategies of how to support April Bridges when he is having a tough time emotionally.  Worked on how pt can take care of herself when April Bridges gets loud in his venting when upset.   Pt has a lot to manage with her 3 sons.  Her oldest son has muscular dystrophy , her middle son has celiac's disease and now her youngest son has crohn's disease.  Pt feels overwhelmed with all she has to manage.  Worked on Child psychotherapist. Provided supportive therapy.   Interventions: Cognitive Behavioral  Therapy and Insight-Oriented  Diagnosis:  F43.22   Plan of Care: Plan to meet monthly.  Pt is progressing toward treatment goals.   Treatment Plan (Treatment Plan Target Date: 12/26/2022) Client Abilities/Strengths  Pt is bright, engaging and motivated for therapy.  Client Treatment Preferences  Individual therapy.  Client Statement of Needs  Improve coping skills.  Symptoms  Autonomic hyperactivity (e.g., palpitations, shortness of breath, dry mouth, trouble swallowing, nausea, diarrhea). Excessive and/or unrealistic worry that is difficult to control occurring more days than not for at least 6 months about a number of events or activities. Hypervigilance (e.g., feeling constantly on edge, experiencing concentration difficulties, having trouble falling or staying asleep, exhibiting a general state of irritability). Motor tension (e.g., restlessness, tiredness, shakiness, muscle tension). Problems Addressed  Anxiety Goals 1. Enhance ability to effectively cope with the full variety of life's worries and anxieties. 2. Learn and implement coping skills that result in a reduction of anxiety and worry, and improved daily functioning. Objective Learn to accept limitations in life and commit to tolerating, rather than avoiding, unpleasant emotions while accomplishing meaningful goals. Target Date: 2022-12-26 Frequency: Monthly Progress: 40 Modality: individual Related Interventions 1. Use techniques from Acceptance and Commitment Therapy to help client accept uncomfortable realities such as lack of complete control, imperfections, and uncertainty and tolerate unpleasant emotions and thoughts in order to accomplish value-consistent goals. Objective Learn and implement problem-solving strategies for realistically addressing worries. Target Date: 2022-12-26 Frequency: Monthly Progress: 40 Modality:  individual Related Interventions 1. Assign the client a homework exercise in which he/she  problem-solves a current problem.  review, reinforce success, and provide corrective feedback toward improvement. 2. Teach the client problem-solving strategies involving specifically defining a problem, generating options for addressing it, evaluating the pros and cons of each option, selecting and implementing an optional action, and reevaluating and refining the action. Objective Learn and implement calming skills to reduce overall anxiety and manage anxiety symptoms. Target Date: 2022-12-26 Frequency: Monthly Progress: 40 Modality: individual Related Interventions 1. Assign the client to read about progressive muscle relaxation and other calming strategies in relevant books or treatment manuals (e.g., Progressive Relaxation Training by Gwynneth Aliment and Dani Gobble; Mastery of Your Anxiety and Worry: Workbook by Beckie Busing). 2. Assign the client homework each session in which he/she practices relaxation exercises daily, gradually applying them progressively from non-anxiety-provoking to anxiety-provoking situations; review and reinforce success while providing corrective feedback toward improvement. 3. Teach the client calming/relaxation skills (e.g., applied relaxation, progressive muscle relaxation, cue controlled relaxation; mindful breathing; biofeedback) and how to discriminate better between relaxation and tension; teach the client how to apply these skills to his/her daily life. 3. Reduce overall frequency, intensity, and duration of the anxiety so that daily functioning is not impaired. 4. Resolve the core conflict that is the source of anxiety. 5. Stabilize anxiety level while increasing ability to function on a daily basis. Diagnosis :    F43.22  Conditions For Discharge Achievement of treatment goals and objectives.  [name], LCSW

## 2022-07-03 IMAGING — US US EXTREM LOW VENOUS*L*
1 series · 13 of 24 positions shown · non-contrast
Comparison: None.

CLINICAL DATA: Left calf region pain and edema

EXAM:
LEFT LOWER EXTREMITY VENOUS DUPLEX ULTRASOUND
TECHNIQUE: Gray-scale sonography with graded compression, as well as color
Doppler and duplex ultrasound were performed to evaluate the left
lower extremity deep venous system from the level of the common
femoral vein and including the common femoral, femoral, profunda
femoral, popliteal and calf veins including the posterior tibial,
peroneal and gastrocnemius veins when visible. The superficial great
saphenous vein was also interrogated. Spectral Doppler was utilized
to evaluate flow at rest and with distal augmentation maneuvers in
the common femoral, femoral and popliteal veins.

[Series 1: us extrem low venous*left* · 13 of 33 slices shown]
[im 1/33]
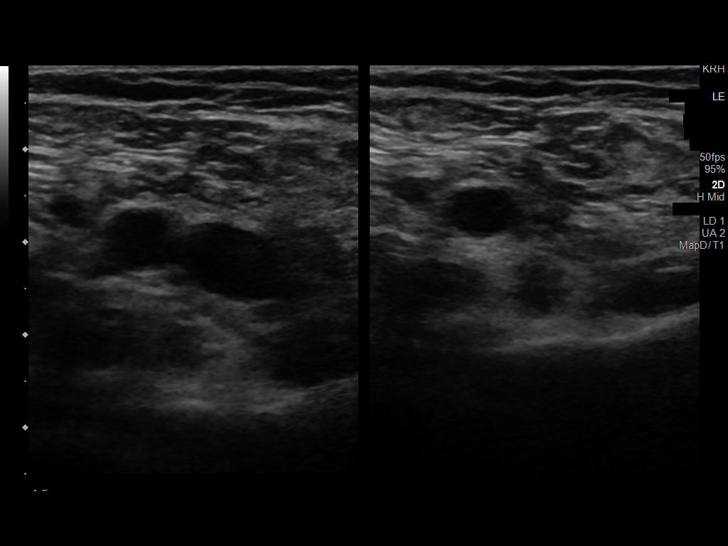
[im 3/33]
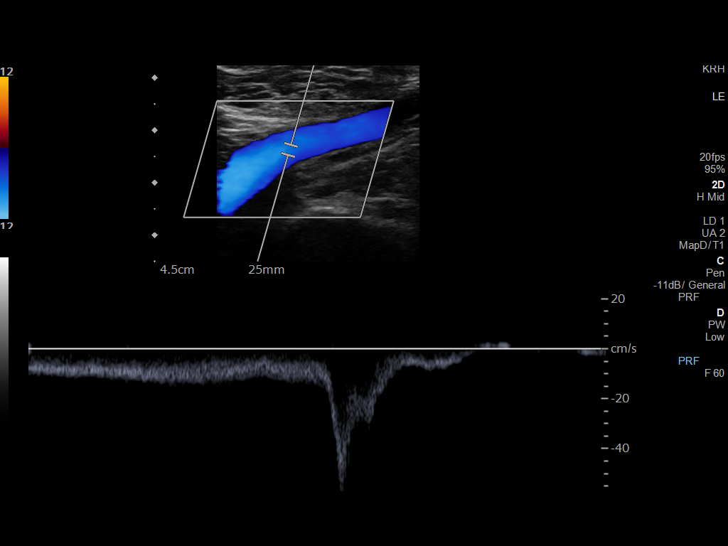
[im 6/33]
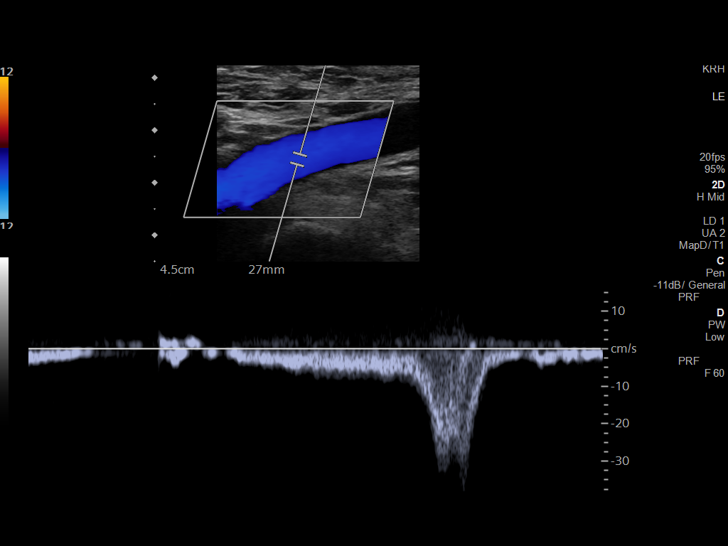
[im 9/33]
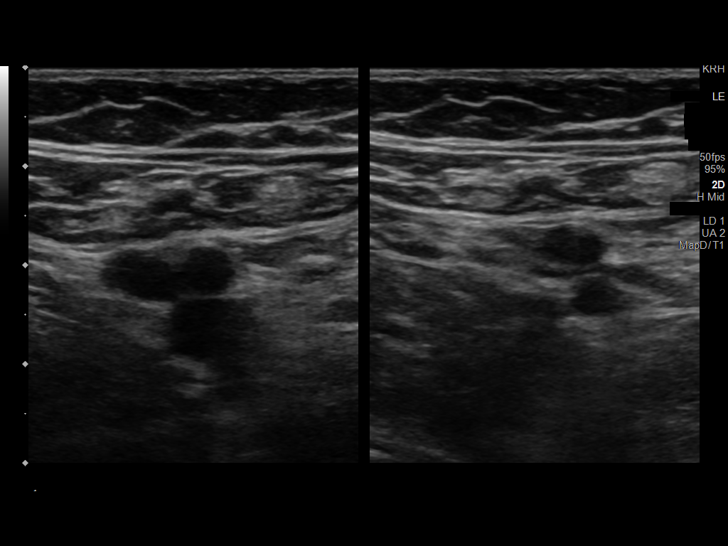
[im 12/33]
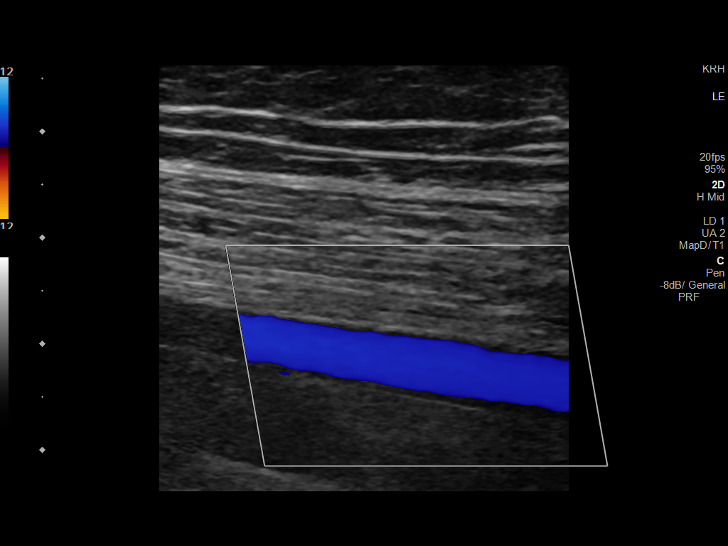
[im 14/33]
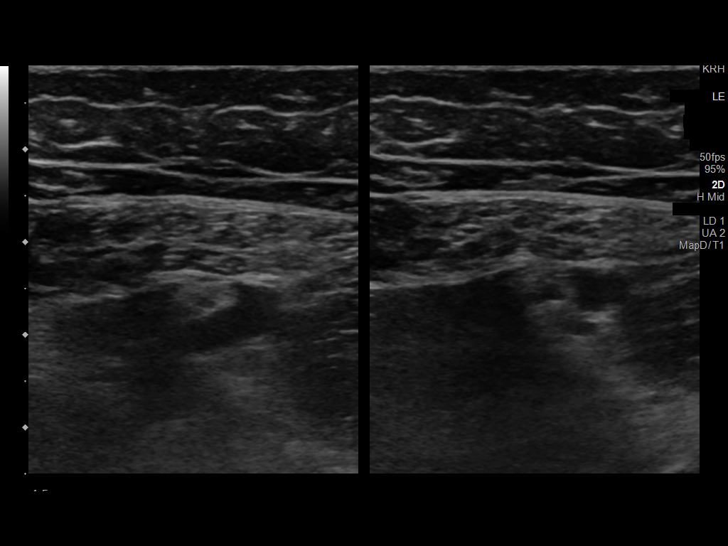
[im 17/33]
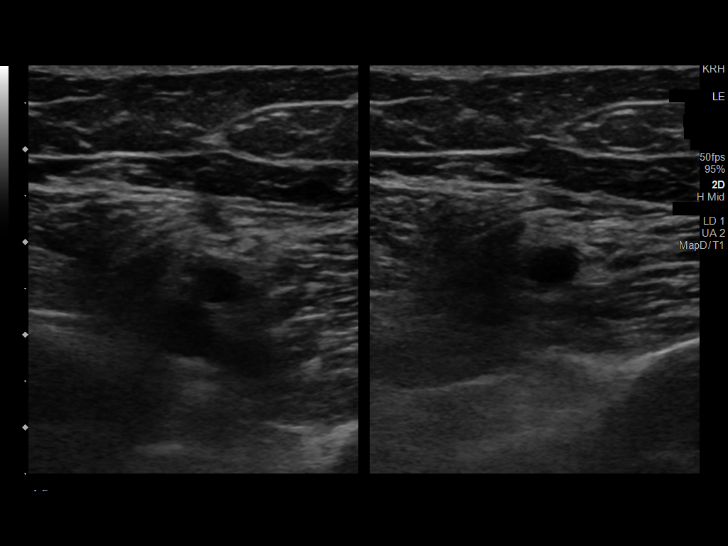
[im 19/33]
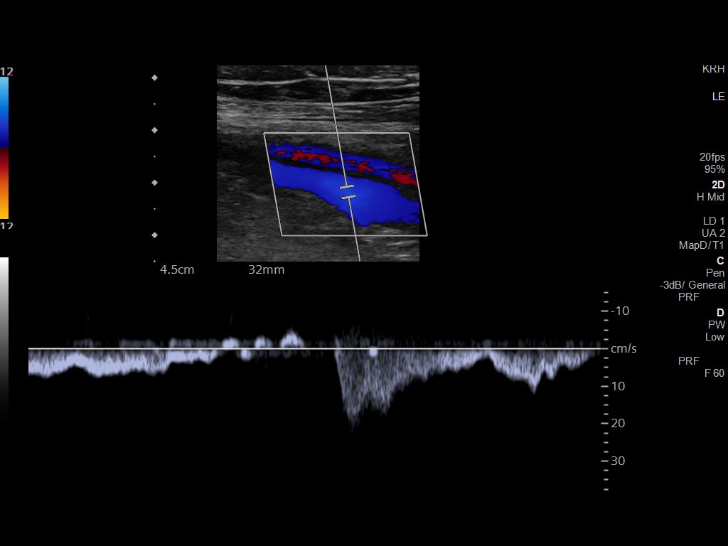
[im 21/33]
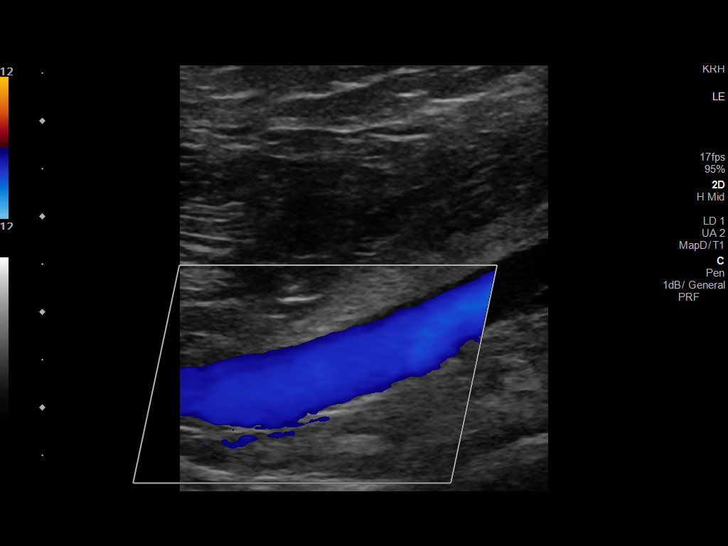
[im 24/33]
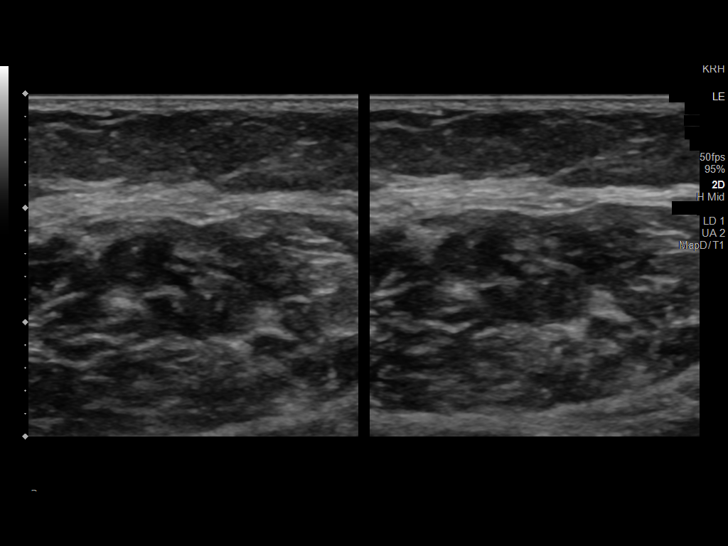
[im 27/33]
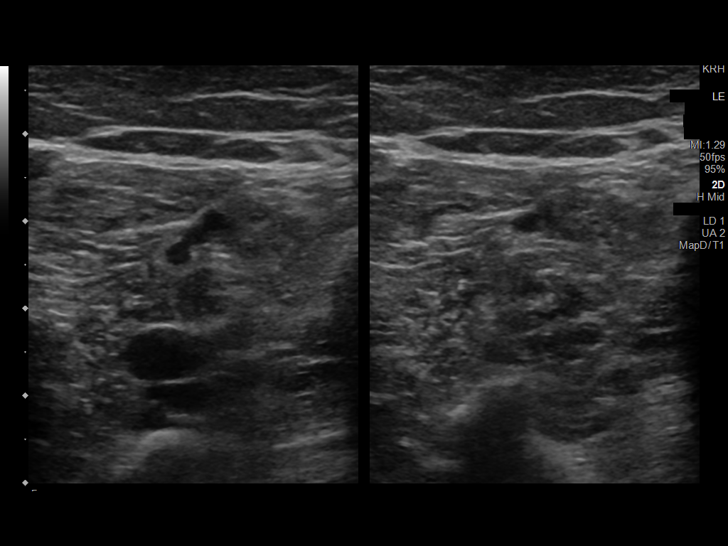
[im 30/33]
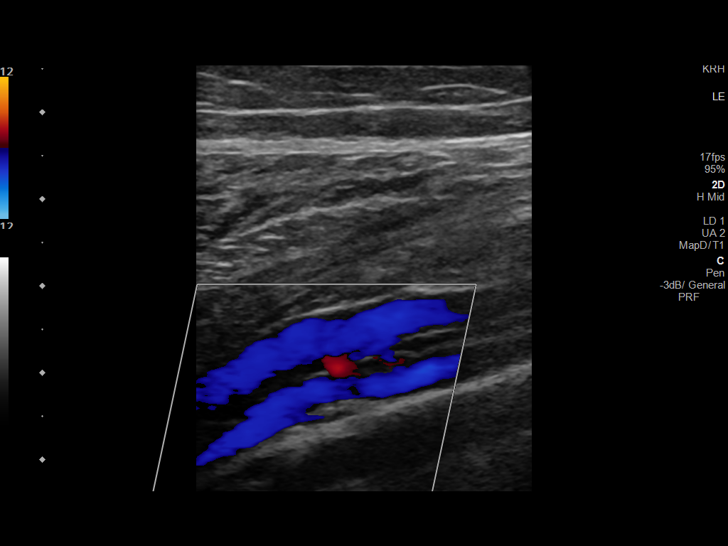
[im 33/33]
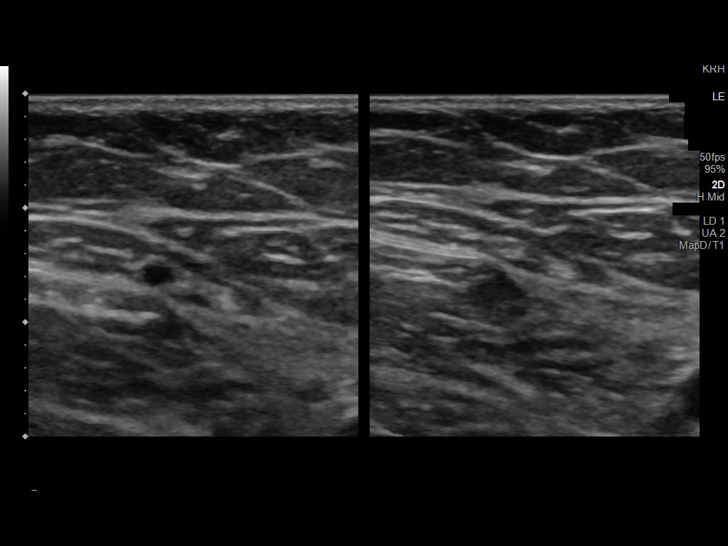

[13 of 24 positions shown; findings below may reference images not displayed]

FINDINGS: Contralateral Common Femoral Vein: Respiratory phasicity is normal
and symmetric with the symptomatic side. No evidence of thrombus.
Normal compressibility.

Common Femoral Vein: No evidence of thrombus. Normal
compressibility, respiratory phasicity and response to augmentation.

Saphenofemoral Junction: No evidence of thrombus. Normal
compressibility and flow on color Doppler imaging.

Profunda Femoral Vein: No evidence of thrombus. Normal
compressibility and flow on color Doppler imaging.

Femoral Vein: No evidence of thrombus. Normal compressibility,
respiratory phasicity and response to augmentation.

Popliteal Vein: No evidence of thrombus. Normal compressibility,
respiratory phasicity and response to augmentation.

Calf Veins: No evidence of thrombus. Normal compressibility and flow
on color Doppler imaging.

Superficial Great Saphenous Vein: No evidence of thrombus. Normal
compressibility.

Venous Reflux:  None.

Other Findings: There is acute appearing thrombus in left
gastrocnemius veins with loss of Doppler signal and absence of flow
in the left calf region. Thrombus is seen near the popliteal vein
but does not extend into the popliteal vein.
IMPRESSION: No evidence of deep venous thrombosis in the left lower extremity.
There is acute superficial venous thrombosis in the left
gastrocnemius veins in the calf region. This thrombus does not
extend into the popliteal vein currently.

Right common femoral vein also present.

## 2022-07-16 ENCOUNTER — Ambulatory Visit (INDEPENDENT_AMBULATORY_CARE_PROVIDER_SITE_OTHER): Payer: 59 | Admitting: Psychology

## 2022-07-16 DIAGNOSIS — F4322 Adjustment disorder with anxiety: Secondary | ICD-10-CM

## 2022-07-16 NOTE — Progress Notes (Signed)
[facility] Counselor/Therapist Progress Note  [name], MRN: [id number],    Date: 07/16/2022  Time Spent: 11:00am - 11:55am     55 minutes   Treatment Type: Individual Therapy  Reported Symptoms: stress  Mental Status Exam: Appearance:  Casual     Behavior: Appropriate  Motor: Normal  Speech/Language:  Normal Rate  Affect: Appropriate  Mood: normal  Thought process: normal  Thought content:   WNL  Sensory/Perceptual disturbances:   WNL  Orientation: oriented to person, place, time/date, and situation  Attention: Good  Concentration: Good  Memory: WNL  Fund of knowledge:  Good  Insight:   Good  Judgment:  Good  Impulse Control: Good   Risk Assessment: Danger to Self:  No Self-injurious Behavior: No Danger to Others: No Duty to Warn:no Physical Aggression / Violence:No  Access to Firearms a concern: No  Gang Involvement:No   Subjective: Pt present for individual therapy via phone.  Pt consents to telehealth session due to [employer] pandemic. Location of pt: home Location of therapist: home office.   Pt talked about helping her son April Bridges at school at [employer].   There are just 4 weeks left of this semester.   Next semester will be a lighter load and is April Bridges's last semester.  He graduates in May.  Pt talked about her sister visiting and how that stirred up family of origin issues.  Pt felt her feelings about her brother and his wife surface.  She still feels angry at them.  Helped pt process her feelings and family dynamics.   Pt feels overwhelmed with all she has to manage.  Worked on Child psychotherapist. Provided supportive therapy.   Interventions: Cognitive Behavioral Therapy and Insight-Oriented  Diagnosis:  F43.22   Plan of Care: Plan to meet monthly.  Pt is progressing toward treatment goals.   Treatment Plan (Treatment Plan Target Date: 12/26/2022) Client Abilities/Strengths  Pt is bright, engaging and motivated for therapy.   Client Treatment Preferences  Individual therapy.  Client Statement of Needs  Improve coping skills.  Symptoms  Autonomic hyperactivity (e.g., palpitations, shortness of breath, dry mouth, trouble swallowing, nausea, diarrhea). Excessive and/or unrealistic worry that is difficult to control occurring more days than not for at least 6 months about a number of events or activities. Hypervigilance (e.g., feeling constantly on edge, experiencing concentration difficulties, having trouble falling or staying asleep, exhibiting a general state of irritability). Motor tension (e.g., restlessness, tiredness, shakiness, muscle tension). Problems Addressed  Anxiety Goals 1. Enhance ability to effectively cope with the full variety of life's worries and anxieties. 2. Learn and implement coping skills that result in a reduction of anxiety and worry, and improved daily functioning. Objective Learn to accept limitations in life and commit to tolerating, rather than avoiding, unpleasant emotions while accomplishing meaningful goals. Target Date: 2022-12-26 Frequency: Monthly Progress: 40 Modality: individual Related Interventions 1. Use techniques from Acceptance and Commitment Therapy to help client accept uncomfortable realities such as lack of complete control, imperfections, and uncertainty and tolerate unpleasant emotions and thoughts in order to accomplish value-consistent goals. Objective Learn and implement problem-solving strategies for realistically addressing worries. Target Date: 2022-12-26 Frequency: Monthly Progress: 40 Modality: individual Related Interventions 1. Assign the client a homework exercise in which he/she problem-solves a current problem.  review, reinforce success, and provide corrective feedback toward improvement. 2. Teach the client problem-solving strategies involving specifically defining a problem, generating options for addressing it, evaluating the pros and cons of each  option, selecting and implementing an optional action, and reevaluating and refining the action. Objective Learn and implement calming skills to reduce overall anxiety and manage anxiety symptoms. Target Date: 2022-12-26 Frequency: Monthly Progress: 40 Modality: individual Related Interventions 1. Assign the client to read about progressive muscle relaxation and other calming strategies in relevant books or treatment manuals (e.g., Progressive Relaxation Training by April Bridges and [employer]; Mastery of Your Anxiety and Worry: Workbook by April [employer]ges). 2. Assign the client homework each session in which he/she practices relaxation exercises daily, gradually applying them progressively from non-anxiety-provoking to anxiety-provoking situations; review and reinforce success while providing corrective feedback toward improvement. 3. Teach the client calming/relaxation skills (e.g., applied relaxation, progressive muscle relaxation, cue controlled relaxation; mindful breathing; biofeedback) and how to discriminate better between relaxation and tension; teach the client how to apply these skills to his/her daily life. 3. Reduce overall frequency, intensity, and duration of the anxiety so that daily functioning is not impaired. 4. Resolve the core conflict that is the source of anxiety. 5. Stabilize anxiety level while increasing ability to function on a daily basis. Diagnosis :    F43.22  Conditions For Discharge Achievement of treatment goals and objectives.  April Whitmoyer, LCSW

## 2022-07-21 ENCOUNTER — Other Ambulatory Visit: Payer: Self-pay | Admitting: Obstetrics and Gynecology

## 2022-07-21 DIAGNOSIS — Z9189 Other specified personal risk factors, not elsewhere classified: Secondary | ICD-10-CM

## 2022-08-06 ENCOUNTER — Ambulatory Visit (INDEPENDENT_AMBULATORY_CARE_PROVIDER_SITE_OTHER): Payer: 59 | Admitting: Psychology

## 2022-08-06 DIAGNOSIS — F4322 Adjustment disorder with anxiety: Secondary | ICD-10-CM

## 2022-08-06 NOTE — Progress Notes (Signed)
[facility] Counselor/Therapist Progress Note  [name], MRN: [id number],    Date: 08/06/2022  Time Spent: 10:00am - 10:55am     55 minutes   Treatment Type: Individual Therapy  Reported Symptoms: stress  Mental Status Exam: Appearance:  Casual     Behavior: Appropriate  Motor: Normal  Speech/Language:  Normal Rate  Affect: Appropriate  Mood: normal  Thought process: normal  Thought content:   WNL  Sensory/Perceptual disturbances:   WNL  Orientation: oriented to person, place, time/date, and situation  Attention: Good  Concentration: Good  Memory: WNL  Fund of knowledge:  Good  Insight:   Good  Judgment:  Good  Impulse Control: Good   Risk Assessment: Danger to Self:  No Self-injurious Behavior: No Danger to Others: No Duty to Warn:no Physical Aggression / Violence:No  Access to Firearms a concern: No  Gang Involvement:No   Subjective: Pt present for individual therapy via phone.  Pt consents to telehealth session due to [place] 19 pandemic. Location of pt: home Location of therapist: home office.   Pt talked about her son [name].   She is at [employer] with him and is glad that the semester is coming to an end.   Pt talked about her sister visiting.  Pt had a lot of thoughts about being judged.  Pt often feels like she does not "do enough".   Addressed pt's thoughts and feelings about being judged.   Identified that she tends to judge herself a lot.   Helped pt process her feelings and relationship dynamics.   Pt talked about plans for Thanksgiving.   She is feelings stress about the plans.   Pt feels overwhelmed with all she has to manage.  Worked on Child psychotherapist. Provided supportive therapy.   Interventions: Cognitive Behavioral Therapy and Insight-Oriented  Diagnosis:  F43.22   Plan of Care: Plan to meet monthly.  Pt is progressing toward treatment goals.   Treatment Plan (Treatment Plan Target Date: 12/26/2022) Client  Abilities/Strengths  Pt is bright, engaging and motivated for therapy.  Client Treatment Preferences  Individual therapy.  Client Statement of Needs  Improve coping skills.  Symptoms  Autonomic hyperactivity (e.g., palpitations, shortness of breath, dry mouth, trouble swallowing, nausea, diarrhea). Excessive and/or unrealistic worry that is difficult to control occurring more days than not for at least 6 months about a number of events or activities. Hypervigilance (e.g., feeling constantly on edge, experiencing concentration difficulties, having trouble falling or staying asleep, exhibiting a general state of irritability). Motor tension (e.g., restlessness, tiredness, shakiness, muscle tension). Problems Addressed  Anxiety Goals 1. Enhance ability to effectively cope with the full variety of life's worries and anxieties. 2. Learn and implement coping skills that result in a reduction of anxiety and worry, and improved daily functioning. Objective Learn to accept limitations in life and commit to tolerating, rather than avoiding, unpleasant emotions while accomplishing meaningful goals. Target Date: 2022-12-26 Frequency: Monthly Progress: 40 Modality: individual Related Interventions 1. Use techniques from Acceptance and Commitment Therapy to help client accept uncomfortable realities such as lack of complete control, imperfections, and uncertainty and tolerate unpleasant emotions and thoughts in order to accomplish value-consistent goals. Objective Learn and implement problem-solving strategies for realistically addressing worries. Target Date: 2022-12-26 Frequency: Monthly Progress: 40 Modality: individual Related Interventions 1. Assign the client a homework exercise in which he/she problem-solves a current problem.  review, reinforce success, and provide corrective feedback toward improvement. 2. Teach the client problem-solving strategies involving specifically  defining a problem,  generating options for addressing it, evaluating the pros and cons of each option, selecting and implementing an optional action, and reevaluating and refining the action. Objective Learn and implement calming skills to reduce overall anxiety and manage anxiety symptoms. Target Date: 2022-12-26 Frequency: Monthly Progress: 40 Modality: individual Related Interventions 1. Assign the client to read about progressive muscle relaxation and other calming strategies in relevant books or treatment manuals (e.g., Progressive Relaxation Training by Gwynneth Aliment and Dani Gobble; Mastery of Your Anxiety and Worry: Workbook by Beckie Busing). 2. Assign the client homework each session in which he/she practices relaxation exercises daily, gradually applying them progressively from non-anxiety-provoking to anxiety-provoking situations; review and reinforce success while providing corrective feedback toward improvement. 3. Teach the client calming/relaxation skills (e.g., applied relaxation, progressive muscle relaxation, cue controlled relaxation; mindful breathing; biofeedback) and how to discriminate better between relaxation and tension; teach the client how to apply these skills to his/her daily life. 3. Reduce overall frequency, intensity, and duration of the anxiety so that daily functioning is not impaired. 4. Resolve the core conflict that is the source of anxiety. 5. Stabilize anxiety level while increasing ability to function on a daily basis. Diagnosis :    F43.22  Conditions For Discharge Achievement of treatment goals and objectives.  Fanchon Papania, LCSW

## 2022-09-04 ENCOUNTER — Ambulatory Visit (INDEPENDENT_AMBULATORY_CARE_PROVIDER_SITE_OTHER): Payer: 59 | Admitting: Psychology

## 2022-09-04 DIAGNOSIS — F4322 Adjustment disorder with anxiety: Secondary | ICD-10-CM

## 2022-09-04 NOTE — Progress Notes (Signed)
[facility] Counselor/Therapist Progress Note  [name], MRN: [id number],    Date: 09/04/2022  Time Spent: 10:00am - 10:55am     55 minutes   Treatment Type: Individual Therapy  Reported Symptoms: stress  Mental Status Exam: Appearance:  Casual     Behavior: Appropriate  Motor: Normal  Speech/Language:  Normal Rate  Affect: Appropriate  Mood: normal  Thought process: normal  Thought content:   WNL  Sensory/Perceptual disturbances:   WNL  Orientation: oriented to person, place, time/date, and situation  Attention: Good  Concentration: Good  Memory: WNL  Fund of knowledge:  Good  Insight:   Good  Judgment:  Good  Impulse Control: Good   Risk Assessment: Danger to Self:  No Self-injurious Behavior: No Danger to Others: No Duty to Warn:no Physical Aggression / Violence:No  Access to Firearms a concern: No  Gang Involvement:No   Subjective: Pt present for individual therapy via phone.  Pt consents to telehealth session due to [place] 19 pandemic. Location of pt: home Location of therapist: home office.   Pt talked about having a "crazy month".   Her brother was in town and pt saw him for the first time in 8 years.   She was anxious about the visit beforehand but was less anxious when she was with him.   Addressed their interaction and helped pt process her feelings and relationship dynamics.   Pt feels overwhelmed with all she has to manage.  Worked on Child psychotherapist. Provided supportive therapy.   Interventions: Cognitive Behavioral Therapy and Insight-Oriented  Diagnosis:  F43.22   Plan of Care: Plan to meet monthly.  Pt is progressing toward treatment goals.   Treatment Plan (Treatment Plan Target Date: 12/26/2022) Client Abilities/Strengths  Pt is bright, engaging and motivated for therapy.  Client Treatment Preferences  Individual therapy.  Client Statement of Needs  Improve coping skills.  Symptoms  Autonomic  hyperactivity (e.g., palpitations, shortness of breath, dry mouth, trouble swallowing, nausea, diarrhea). Excessive and/or unrealistic worry that is difficult to control occurring more days than not for at least 6 months about a number of events or activities. Hypervigilance (e.g., feeling constantly on edge, experiencing concentration difficulties, having trouble falling or staying asleep, exhibiting a general state of irritability). Motor tension (e.g., restlessness, tiredness, shakiness, muscle tension). Problems Addressed  Anxiety Goals 1. Enhance ability to effectively cope with the full variety of life's worries and anxieties. 2. Learn and implement coping skills that result in a reduction of anxiety and worry, and improved daily functioning. Objective Learn to accept limitations in life and commit to tolerating, rather than avoiding, unpleasant emotions while accomplishing meaningful goals. Target Date: 2022-12-26 Frequency: Monthly Progress: 40 Modality: individual Related Interventions 1. Use techniques from Acceptance and Commitment Therapy to help client accept uncomfortable realities such as lack of complete control, imperfections, and uncertainty and tolerate unpleasant emotions and thoughts in order to accomplish value-consistent goals. Objective Learn and implement problem-solving strategies for realistically addressing worries. Target Date: 2022-12-26 Frequency: Monthly Progress: 40 Modality: individual Related Interventions 1. Assign the client a homework exercise in which he/she problem-solves a current problem.  review, reinforce success, and provide corrective feedback toward improvement. 2. Teach the client problem-solving strategies involving specifically defining a problem, generating options for addressing it, evaluating the pros and cons of each option, selecting and implementing an optional action, and reevaluating and refining the action. Objective Learn and implement  calming skills to reduce overall anxiety and manage anxiety symptoms. Target Date:  2022-12-26 Frequency: Monthly Progress: 40 Modality: individual Related Interventions 1. Assign the client to read about progressive muscle relaxation and other calming strategies in relevant books or treatment manuals (e.g., Progressive Relaxation Training by Gwynneth Aliment and Dani Gobble; Mastery of Your Anxiety and Worry: Workbook by Beckie Busing). 2. Assign the client homework each session in which he/she practices relaxation exercises daily, gradually applying them progressively from non-anxiety-provoking to anxiety-provoking situations; review and reinforce success while providing corrective feedback toward improvement. 3. Teach the client calming/relaxation skills (e.g., applied relaxation, progressive muscle relaxation, cue controlled relaxation; mindful breathing; biofeedback) and how to discriminate better between relaxation and tension; teach the client how to apply these skills to his/her daily life. 3. Reduce overall frequency, intensity, and duration of the anxiety so that daily functioning is not impaired. 4. Resolve the core conflict that is the source of anxiety. 5. Stabilize anxiety level while increasing ability to function on a daily basis. Diagnosis :    F43.22  Conditions For Discharge Achievement of treatment goals and objectives.  Berlynn Warsame, LCSW

## 2022-09-08 ENCOUNTER — Ambulatory Visit
Admission: RE | Admit: 2022-09-08 | Discharge: 2022-09-08 | Disposition: A | Discharge status: Home / Self Care | Payer: Managed Care, Other (non HMO) | Source: Ambulatory Visit | Attending: Obstetrics and Gynecology | Admitting: Obstetrics and Gynecology

## 2022-09-08 DIAGNOSIS — Z9189 Other specified personal risk factors, not elsewhere classified: Secondary | ICD-10-CM

## 2022-09-08 MED ORDER — GADOPICLENOL 0.5 MMOL/ML IV SOLN
7.0000 mL | Freq: Once | INTRAVENOUS | Status: AC | PRN
  Administered 2022-09-08: 7 mL via INTRAVENOUS

## 2022-10-08 ENCOUNTER — Ambulatory Visit (INDEPENDENT_AMBULATORY_CARE_PROVIDER_SITE_OTHER): Payer: 59 | Admitting: Psychology

## 2022-10-08 DIAGNOSIS — F4322 Adjustment disorder with anxiety: Secondary | ICD-10-CM

## 2022-10-08 NOTE — Progress Notes (Signed)
[facility] Counselor/Therapist Progress Note  [name], MRN: [id number],    Date: 10/08/2022  Time Spent: 9:00am - 9:55am     55 minutes   Treatment Type: Individual Therapy  Reported Symptoms: stress  Mental Status Exam: Appearance:  Casual     Behavior: Appropriate  Motor: Normal  Speech/Language:  Normal Rate  Affect: Appropriate  Mood: normal  Thought process: normal  Thought content:   WNL  Sensory/Perceptual disturbances:   WNL  Orientation: oriented to person, place, time/date, and situation  Attention: Good  Concentration: Good  Memory: WNL  Fund of knowledge:  Good  Insight:   Good  Judgment:  Good  Impulse Control: Good   Risk Assessment: Danger to Self:  No Self-injurious Behavior: No Danger to Others: No Duty to Warn:no Physical Aggression / Violence:No  Access to Firearms a concern: No  Gang Involvement:No   Subjective: Pt present for individual therapy via phone.  Pt consents to telehealth session due to [place] 19 pandemic. Location of pt: home Location of therapist: home office.   Pt talked about being at [employer] with her son to help him with his last semester.   Pt is hopeful this will be an easier semester.   Pt talked about her parents visiting for a week and a half over Christmas.   Pt realized she has been holding onto their expectation that they have a close family.   This has been hard for pt regarding her relationship with her brother.   Helped pt process her feelings and relationship dynamics. Pt talked about her anxiety.   She is having some anticipatory anxiety about potential upcoming family visits.  Worked on thought reframing and calming strategies.  Pt feels overwhelmed with all she has to manage.  Worked on Child psychotherapist. Provided supportive therapy.   Interventions: Cognitive Behavioral Therapy and Insight-Oriented  Diagnosis:  F43.22   Plan of Care: Plan to meet monthly.  Pt is progressing  toward treatment goals.   Treatment Plan (Treatment Plan Target Date: 12/26/2022) Client Abilities/Strengths  Pt is bright, engaging and motivated for therapy.  Client Treatment Preferences  Individual therapy.  Client Statement of Needs  Improve coping skills.  Symptoms  Autonomic hyperactivity (e.g., palpitations, shortness of breath, dry mouth, trouble swallowing, nausea, diarrhea). Excessive and/or unrealistic worry that is difficult to control occurring more days than not for at least 6 months about a number of events or activities. Hypervigilance (e.g., feeling constantly on edge, experiencing concentration difficulties, having trouble falling or staying asleep, exhibiting a general state of irritability). Motor tension (e.g., restlessness, tiredness, shakiness, muscle tension). Problems Addressed  Anxiety Goals 1. Enhance ability to effectively cope with the full variety of life's worries and anxieties. 2. Learn and implement coping skills that result in a reduction of anxiety and worry, and improved daily functioning. Objective Learn to accept limitations in life and commit to tolerating, rather than avoiding, unpleasant emotions while accomplishing meaningful goals. Target Date: 2022-12-26 Frequency: Monthly Progress: 40 Modality: individual Related Interventions 1. Use techniques from Acceptance and Commitment Therapy to help client accept uncomfortable realities such as lack of complete control, imperfections, and uncertainty and tolerate unpleasant emotions and thoughts in order to accomplish value-consistent goals. Objective Learn and implement problem-solving strategies for realistically addressing worries. Target Date: 2022-12-26 Frequency: Monthly Progress: 40 Modality: individual Related Interventions 1. Assign the client a homework exercise in which he/she problem-solves a current problem.  review, reinforce success, and provide corrective feedback toward  improvement. 2. Teach the client problem-solving strategies involving specifically defining a problem, generating options for addressing it, evaluating the pros and cons of each option, selecting and implementing an optional action, and reevaluating and refining the action. Objective Learn and implement calming skills to reduce overall anxiety and manage anxiety symptoms. Target Date: 2022-12-26 Frequency: Monthly Progress: 40 Modality: individual Related Interventions 1. Assign the client to read about progressive muscle relaxation and other calming strategies in relevant books or treatment manuals (e.g., Progressive Relaxation Training by Gwynneth Aliment and Dani Gobble; Mastery of Your Anxiety and Worry: Workbook by Beckie Busing). 2. Assign the client homework each session in which he/she practices relaxation exercises daily, gradually applying them progressively from non-anxiety-provoking to anxiety-provoking situations; review and reinforce success while providing corrective feedback toward improvement. 3. Teach the client calming/relaxation skills (e.g., applied relaxation, progressive muscle relaxation, cue controlled relaxation; mindful breathing; biofeedback) and how to discriminate better between relaxation and tension; teach the client how to apply these skills to his/her daily life. 3. Reduce overall frequency, intensity, and duration of the anxiety so that daily functioning is not impaired. 4. Resolve the core conflict that is the source of anxiety. 5. Stabilize anxiety level while increasing ability to function on a daily basis. Diagnosis :    F43.22  Conditions For Discharge Achievement of treatment goals and objectives.  Mishell Donalson, LCSW

## 2022-11-12 ENCOUNTER — Ambulatory Visit (INDEPENDENT_AMBULATORY_CARE_PROVIDER_SITE_OTHER): Payer: 59 | Admitting: Psychology

## 2022-11-12 DIAGNOSIS — F4322 Adjustment disorder with anxiety: Secondary | ICD-10-CM | POA: Diagnosis not present

## 2022-11-12 NOTE — Progress Notes (Signed)
[facility] Counselor/Therapist Progress Note  [name], MRN: [id number],    Date: 11/12/2022  Time Spent: 9:00am - 9:55am     55 minutes   Treatment Type: Individual Therapy  Reported Symptoms: stress  Mental Status Exam: Appearance:  Casual     Behavior: Appropriate  Motor: Normal  Speech/Language:  Normal Rate  Affect: Appropriate  Mood: normal  Thought process: normal  Thought content:   WNL  Sensory/Perceptual disturbances:   WNL  Orientation: oriented to person, place, time/date, and situation  Attention: Good  Concentration: Good  Memory: WNL  Fund of knowledge:  Good  Insight:   Good  Judgment:  Good  Impulse Control: Good   Risk Assessment: Danger to Self:  No Self-injurious Behavior: No Danger to Others: No Duty to Warn:no Physical Aggression / Violence:No  Access to Firearms a concern: No  Gang Involvement:No   Subjective: Pt present for individual therapy via phone.  Pt consents to telehealth session due to [place] 19 pandemic. Location of pt: home Location of therapist: home office.   Pt talked about family issues.   She has felt pressure to connect with her brother to make her parents happy but has been working on releasing that. Pt's inlaws visited recently and pt felt very criticized by her mother in law.  Addressed the interactions.  Helped pt process her feelings and relationship dynamics. Addressed how family dynamics affect pt's sense of herself and self esteem.  Pt talked about her anxiety. Pt feels overwhelmed with all she has to manage.  Worked on Child psychotherapist. Provided supportive therapy.   Interventions: Cognitive Behavioral Therapy and Insight-Oriented  Diagnosis:  F43.22   Plan of Care: Plan to meet monthly.  Pt is progressing toward treatment goals.   Treatment Plan (Treatment Plan Target Date: 12/26/2022) Client Abilities/Strengths  Pt is bright, engaging and motivated for therapy.  Client  Treatment Preferences  Individual therapy.  Client Statement of Needs  Improve coping skills.  Symptoms  Autonomic hyperactivity (e.g., palpitations, shortness of breath, dry mouth, trouble swallowing, nausea, diarrhea). Excessive and/or unrealistic worry that is difficult to control occurring more days than not for at least 6 months about a number of events or activities. Hypervigilance (e.g., feeling constantly on edge, experiencing concentration difficulties, having trouble falling or staying asleep, exhibiting a general state of irritability). Motor tension (e.g., restlessness, tiredness, shakiness, muscle tension). Problems Addressed  Anxiety Goals 1. Enhance ability to effectively cope with the full variety of life's worries and anxieties. 2. Learn and implement coping skills that result in a reduction of anxiety and worry, and improved daily functioning. Objective Learn to accept limitations in life and commit to tolerating, rather than avoiding, unpleasant emotions while accomplishing meaningful goals. Target Date: 2022-12-26 Frequency: Monthly Progress: 40 Modality: individual Related Interventions 1. Use techniques from Acceptance and Commitment Therapy to help client accept uncomfortable realities such as lack of complete control, imperfections, and uncertainty and tolerate unpleasant emotions and thoughts in order to accomplish value-consistent goals. Objective Learn and implement problem-solving strategies for realistically addressing worries. Target Date: 2022-12-26 Frequency: Monthly Progress: 40 Modality: individual Related Interventions 1. Assign the client a homework exercise in which he/she problem-solves a current problem.  review, reinforce success, and provide corrective feedback toward improvement. 2. Teach the client problem-solving strategies involving specifically defining a problem, generating options for addressing it, evaluating the pros and cons of each option,  selecting and implementing an optional action, and reevaluating and refining the action. Objective Learn  and implement calming skills to reduce overall anxiety and manage anxiety symptoms. Target Date: 2022-12-26 Frequency: Monthly Progress: 40 Modality: individual Related Interventions 1. Assign the client to read about progressive muscle relaxation and other calming strategies in relevant books or treatment manuals (e.g., Progressive Relaxation Training by Gwynneth Aliment and Dani Gobble; Mastery of Your Anxiety and Worry: Workbook by Beckie Busing). 2. Assign the client homework each session in which he/she practices relaxation exercises daily, gradually applying them progressively from non-anxiety-provoking to anxiety-provoking situations; review and reinforce success while providing corrective feedback toward improvement. 3. Teach the client calming/relaxation skills (e.g., applied relaxation, progressive muscle relaxation, cue controlled relaxation; mindful breathing; biofeedback) and how to discriminate better between relaxation and tension; teach the client how to apply these skills to his/her daily life. 3. Reduce overall frequency, intensity, and duration of the anxiety so that daily functioning is not impaired. 4. Resolve the core conflict that is the source of anxiety. 5. Stabilize anxiety level while increasing ability to function on a daily basis. Diagnosis :    F43.22  Conditions For Discharge Achievement of treatment goals and objectives.  Camauri Craton, LCSW

## 2022-12-10 ENCOUNTER — Ambulatory Visit (INDEPENDENT_AMBULATORY_CARE_PROVIDER_SITE_OTHER): Payer: 59 | Admitting: Psychology

## 2022-12-10 DIAGNOSIS — F4322 Adjustment disorder with anxiety: Secondary | ICD-10-CM | POA: Diagnosis not present

## 2022-12-10 NOTE — Progress Notes (Signed)
[facility] Counselor/Therapist Progress Note  Patient ID: Olajumoke Loberg, MRN: [id number],    Date: 12/10/2022  Time Spent: 9:00am - 9:55am     55 minutes   Treatment Type: Individual Therapy  Reported Symptoms: stress  Mental Status Exam: Appearance:  Casual     Behavior: Appropriate  Motor: Normal  Speech/Language:  Normal Rate  Affect: Appropriate  Mood: normal  Thought process: normal  Thought content:   WNL  Sensory/Perceptual disturbances:   WNL  Orientation: oriented to person, place, time/date, and situation  Attention: Good  Concentration: Good  Memory: WNL  Fund of knowledge:  Good  Insight:   Good  Judgment:  Good  Impulse Control: Good   Risk Assessment: Danger to Self:  No Self-injurious Behavior: No Danger to Others: No Duty to Warn:no Physical Aggression / Violence:No  Access to Firearms a concern: No  Gang Involvement:No   Subjective: Pt present for individual therapy via video.  Pt consents to telehealth session due to [place] 19 pandemic. Location of pt: home Location of therapist: home office.   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  He is wanting to go to graduate school which is a big decision and impact on pt.   Addressed the issues and worked on Control and instrumentation engineer.   Pt talked about concerns about her husband and how stressed he is regarding his job.  Addressed the concerns.  Helped pt process her feelings and relationship dynamics. Pt talked about a friend of hers dying recently.  It has made pt think about her age and her own mortality.   Pt talked about her anxiety. Pt feels overwhelmed with all she has to manage.  Worked on Child psychotherapist. Provided supportive therapy.   Interventions: Cognitive Behavioral Therapy and Insight-Oriented  Diagnosis:  F43.22   Plan of Care: Plan to meet monthly.  Pt is progressing toward treatment goals.   Treatment Plan (Treatment Plan Target Date: 12/26/2022) Client  Abilities/Strengths  Pt is bright, engaging and motivated for therapy.  Client Treatment Preferences  Individual therapy.  Client Statement of Needs  Improve coping skills.  Symptoms  Autonomic hyperactivity (e.g., palpitations, shortness of breath, dry mouth, trouble swallowing, nausea, diarrhea). Excessive and/or unrealistic worry that is difficult to control occurring more days than not for at least 6 months about a number of events or activities. Hypervigilance (e.g., feeling constantly on edge, experiencing concentration difficulties, having trouble falling or staying asleep, exhibiting a general state of irritability). Motor tension (e.g., restlessness, tiredness, shakiness, muscle tension). Problems Addressed  Anxiety Goals 1. Enhance ability to effectively cope with the full variety of life's worries and anxieties. 2. Learn and implement coping skills that result in a reduction of anxiety and worry, and improved daily functioning. Objective Learn to accept limitations in life and commit to tolerating, rather than avoiding, unpleasant emotions while accomplishing meaningful goals. Target Date: 2022-12-26 Frequency: Monthly Progress: 40 Modality: individual Related Interventions 1. Use techniques from Acceptance and Commitment Therapy to help client accept uncomfortable realities such as lack of complete control, imperfections, and uncertainty and tolerate unpleasant emotions and thoughts in order to accomplish value-consistent goals. Objective Learn and implement problem-solving strategies for realistically addressing worries. Target Date: 2022-12-26 Frequency: Monthly Progress: 40 Modality: individual Related Interventions 1. Assign the client a homework exercise in which he/she problem-solves a current problem.  review, reinforce success, and provide corrective feedback toward improvement. 2. Teach the client problem-solving strategies involving specifically defining a problem,  generating  options for addressing it, evaluating the pros and cons of each option, selecting and implementing an optional action, and reevaluating and refining the action. Objective Learn and implement calming skills to reduce overall anxiety and manage anxiety symptoms. Target Date: 2022-12-26 Frequency: Monthly Progress: 40 Modality: individual Related Interventions 1. Assign the client to read about progressive muscle relaxation and other calming strategies in relevant books or treatment manuals (e.g., Progressive Relaxation Training by Gwynneth Aliment and Dani Gobble; Mastery of Your Anxiety and Worry: Workbook by Beckie Busing). 2. Assign the client homework each session in which he/she practices relaxation exercises daily, gradually applying them progressively from non-anxiety-provoking to anxiety-provoking situations; review and reinforce success while providing corrective feedback toward improvement. 3. Teach the client calming/relaxation skills (e.g., applied relaxation, progressive muscle relaxation, cue controlled relaxation; mindful breathing; biofeedback) and how to discriminate better between relaxation and tension; teach the client how to apply these skills to his/her daily life. 3. Reduce overall frequency, intensity, and duration of the anxiety so that daily functioning is not impaired. 4. Resolve the core conflict that is the source of anxiety. 5. Stabilize anxiety level while increasing ability to function on a daily basis. Diagnosis :    F43.22  Conditions For Discharge Achievement of treatment goals and objectives.  Zamya Culhane, LCSW

## 2023-01-04 ENCOUNTER — Encounter: Payer: Self-pay | Admitting: *Deleted

## 2023-01-07 ENCOUNTER — Ambulatory Visit (INDEPENDENT_AMBULATORY_CARE_PROVIDER_SITE_OTHER): Payer: 59 | Admitting: Psychology

## 2023-01-07 DIAGNOSIS — F4322 Adjustment disorder with anxiety: Secondary | ICD-10-CM

## 2023-01-07 NOTE — Progress Notes (Signed)
[facility] Counselor Initial Adult Exam  Name: April Bridges Date: 01/07/2023 MRN: [id number] DOB: 1971/02/13 PCP: Felix Pacini A, DO  Time spent: 9:00am-9:55am   55 minutes  Guardian/Payee:  n/a    Paperwork requested: No   Reason for Visit /Presenting Problem: Pt present for face-to-face initial assessment update via video.  Pt consents to telehealth session due to COVID 19 pandemic. Location of pt: home Location of therapist: home office.  Pt continues to struggle with anxiety at times.   She also has challenging family dynamics.  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 Reviewed pt's treatment plan for annual update.  Updated pt's treatment plan and IA.  Pt participated in setting treatment goals.  Pt wants to continue to have a safe place to talk and to improve coping skills.   Plan to continue to meet monthly.    Mental Status Exam: Appearance:   Casual     Behavior:  Appropriate  Motor:  Normal  Speech/Language:   Normal Rate  Affect:  Appropriate  Mood:  normal  Thought process:  normal  Thought content:    WNL  Sensory/Perceptual disturbances:    WNL  Orientation:  oriented to person, place, time/date, and situation  Attention:  Good  Concentration:  Good  Memory:  WNL  Fund of knowledge:   Good  Insight:    Good  Judgment:   Good  Impulse Control:  Good    Reported Symptoms:  stress  Risk Assessment: Danger to Self:  No Self-injurious Behavior: No Danger to Others: No Duty to Warn:no Physical Aggression / Violence:No  Access to Firearms a concern: No  Gang Involvement:No  Patient / guardian was educated about steps to take if suicide or homicide risk level increases between visits: n/a While future psychiatric events cannot be accurately predicted, the patient does not currently require acute inpatient psychiatric care and does not currently meet [facility] involuntary commitment criteria.  Substance Abuse History: Current  substance abuse: No     Past Psychiatric History:   Previous psychological history is significant for anxiety Outpatient Providers:pt has had therapy in the past. History of Psych Hospitalization: No  Psychological Testing:  n/a    Abuse History:  Victim of: No.,  n/a    Report needed: No. Victim of Neglect:No. Perpetrator of  n/a   Witness / Exposure to Domestic Violence: No   Protective Services Involvement: No  Witness to [employer] Violence:  No   Family History:  Family History  Problem Relation Age of Onset   Hypertension Mother    Diabetes Mother        2, diet controlled   Breast cancer Mother    Thyroid disease Father        Hashimoto's   Hypertension Sister    Depression Sister        related to cycle   Other Sister        gluten intolerate or celiac, rectocele repair x 2 with bowel adhesions.   Stroke Sister 33       s/p infections and surgeries   Depression Sister    Fibromyalgia Sister    Thyroid disease Sister    Cholelithiasis Sister    Muscular dystrophy Son        Rupert Stacks   Other Son        Neurogenic bladder   Asthma Son        mild   Celiac disease Son    Celiac  disease Son    Diabetes Maternal Uncle    Stroke Maternal Grandmother    Heart disease Maternal Grandfather    Heart attack Maternal Grandfather        ?   Diabetes Paternal Grandmother        type 2   Hypertension Paternal Grandmother    Obesity Paternal Grandmother    Emphysema Paternal Grandfather        smoker    Living situation: the patient lives with husband and 3 sons.  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  She has a 28 yo son with muscular dystrophy .   Pt and husband have three boys ages 16, 66, and 38.   The two older boys were diagnosed with celiac disease.     Pt grew up with parents and 3 siblings.  Pt is the 3rd in birth order.  Pt feels in general her upbringing was good.   Pt feels close with family.    Sexual Orientation: Straight  Relationship Status: married  Name  of spouse / other:Married 29 years.  Pt states they are very close.  Husband is a support.  Husband is an Acupuncturist.  If a parent, number of children / ages:pt has 3 sons.  Support Systems: spouse friends  Surveyor, quantity Stress:  No   Income/Employment/Disability: Employment  Financial planner: No   Educational History: Education: Risk manager: Protestant  Any cultural differences that may affect / interfere with treatment:  not applicable   Recreation/Hobbies: spending time with family. reading  Stressors: Other: concerns about son's physical challenges,  anxiety, family stress.    Strengths: Supportive Relationships, Family, Spirituality, Hopefulness, Self Advocate, and Able to Communicate Effectively  Barriers:  none   Legal History: Pending legal issue / charges: The patient has no significant history of legal issues. History of legal issue / charges:  n/a  Medical History/Surgical History: reviewed Past Medical History:  Diagnosis Date   Allergies    Anxiety state 07/01/2014   Asthma    cats, dust, triggers   Atypical moles 03/03/2012   Breast lump on right side at 10 o'clock position 03/21/2012   Chicken pox as a child   Gestational thrombocytopenia (HCC) 02/29/2012   History of recurrent UTIs 07/28/2016   Hx of blood clots    in leg   Hyperglycemia 02/29/2012   Hyperlipidemia 07/01/2013   Lateral epicondylitis of left elbow 04/12/2019   Migraine 06/26/2013   Mumps as a child   Neck pain 07/01/2014    Past Surgical History:  Procedure Laterality Date   CESAREAN SECTION  1999, 2001,2003   X 3   HAMMER TOE SURGERY  1992   right 5th toe   TUBAL LIGATION      Medications: Current Outpatient Medications  Medication Sig Dispense Refill   albuterol (PROVENTIL HFA;VENTOLIN HFA) 108 (90 Base) MCG/ACT inhaler Inhale 2 puffs into the lungs every 6 (six) hours as needed for wheezing. 1 Inhaler 1   cephALEXin  (KEFLEX) 500 MG capsule Take 1 capsule after intercourse. 30 capsule 2   fluticasone furoate-vilanterol (BREO ELLIPTA) 200-25 MCG/INH AEPB Inhale into the lungs.     progesterone (PROMETRIUM) 100 MG capsule Take 100 mg by mouth daily.     No current facility-administered medications for this visit.    Allergies  Allergen Reactions   Molds & Smuts     COUGH, TIGHTNESS IN CHEST   Other     CAT DANDER= tightness in chest, watery eyes, cough  Diagnoses:  F43.22  Plan of Care: Recommend ongoing therapy.  Plan to meet monthly.  Pt is progressing toward treatment goals.   Treatment Plan (Treatment Plan Target Date: 01/07/2024) Client Abilities/Strengths  Pt is bright, engaging and motivated for therapy.  Client Treatment Preferences  Individual therapy.  Client Statement of Needs  Improve coping skills.  Symptoms  Autonomic hyperactivity (e.g., palpitations, shortness of breath, dry mouth, trouble swallowing, nausea, diarrhea). Excessive and/or unrealistic worry that is difficult to control occurring more days than not for at least 6 months about a number of events or activities. Hypervigilance (e.g., feeling constantly on edge, experiencing concentration difficulties, having trouble falling or staying asleep, exhibiting a general state of irritability). Motor tension (e.g., restlessness, tiredness, shakiness, muscle tension). Problems Addressed  Anxiety Goals 1. Enhance ability to effectively cope with the full variety of life's worries and anxieties. 2. Learn and implement coping skills that result in a reduction of anxiety and worry, and improved daily functioning. Objective Learn to accept limitations in life and commit to tolerating, rather than avoiding, unpleasant emotions while accomplishing meaningful goals. Target Date: 2024-01-07 Frequency: Monthly Progress: 50 Modality: individual Related Interventions 1. Use techniques from Acceptance and Commitment Therapy to help  client accept uncomfortable realities such as lack of complete control, imperfections, and uncertainty and tolerate unpleasant emotions and thoughts in order to accomplish value-consistent goals. Objective Learn and implement problem-solving strategies for realistically addressing worries. Target Date: 2024-01-07 Frequency: Monthly Progress: 50 Modality: individual Related Interventions 1. Assign the client a homework exercise in which he/she problem-solves a current problem.  review, reinforce success, and provide corrective feedback toward improvement. 2. Teach the client problem-solving strategies involving specifically defining a problem, generating options for addressing it, evaluating the pros and cons of each option, selecting and implementing an optional action, and reevaluating and refining the action. Objective Learn and implement calming skills to reduce overall anxiety and manage anxiety symptoms. Target Date: 2024-01-07 Frequency: Monthly Progress: 50 Modality: individual Related Interventions 1. Assign the client to read about progressive muscle relaxation and other calming strategies in relevant books or treatment manuals (e.g., Progressive Relaxation Training by Robb Matar and Alen Blew; Mastery of Your Anxiety and Worry: Workbook by Earlie Counts). 2. Assign the client homework each session in which he/she practices relaxation exercises daily, gradually applying them progressively from non-anxiety-provoking to anxiety-provoking situations; review and reinforce success while providing corrective feedback toward improvement. 3. Teach the client calming/relaxation skills (e.g., applied relaxation, progressive muscle relaxation, cue controlled relaxation; mindful breathing; biofeedback) and how to discriminate better between relaxation and tension; teach the client how to apply these skills to his/her daily life. 3. Reduce overall frequency, intensity, and duration of the anxiety so  that daily functioning is not impaired. 4. Resolve the core conflict that is the source of anxiety. 5. Stabilize anxiety level while increasing ability to function on a daily basis. Diagnosis :    F43.22  Conditions For Discharge Achievement of treatment goals and objectives.    Alamin Mccuiston, LCSW

## 2023-02-11 ENCOUNTER — Ambulatory Visit (INDEPENDENT_AMBULATORY_CARE_PROVIDER_SITE_OTHER): Payer: 59 | Admitting: Psychology

## 2023-02-11 DIAGNOSIS — F4322 Adjustment disorder with anxiety: Secondary | ICD-10-CM

## 2023-02-11 NOTE — Progress Notes (Signed)
[facility] Counselor/Therapist Progress Note  [name], MRN: [id number],    Date: 02/11/2023  Time Spent: 9:00am-9:50am   50 minutes   Treatment Type: Individual Therapy  Reported Symptoms: stress  Mental Status Exam: Appearance:  Casual     Behavior: Appropriate  Motor: Normal  Speech/Language:  Normal Rate  Affect: Appropriate  Mood: normal  Thought process: normal  Thought content:   WNL  Sensory/Perceptual disturbances:   WNL  Orientation: oriented to person, place, time/date, and situation  Attention: Good  Concentration: Good  Memory: WNL  Fund of knowledge:  Good  Insight:   Good  Judgment:  Good  Impulse Control: Good   Risk Assessment: Danger to Self:  No Self-injurious Behavior: No Danger to Others: No Duty to Warn:no Physical Aggression / Violence:No  Access to Firearms a concern: No  Gang Involvement:No   Subjective: Pt present for face-to-face individual therapy via video.  Pt consents to telehealth video session due to COVID 19 pandemic. Location of pt: home Location of therapist: home office.   Pt talked about being home bc her son's semester at [employer] is done for the summer.   Pt states it is a relief to be home but there is also a lot of things to be done at home that feels overwhelming.   Pt is concerned about her husband who seems depressed.   Pt is realizing that she can't "fix it".   Helped pt process her feelings and relationship dynamics.  Addressed how pt can add some things she enjoys into her schedule this summer. Worked on self care strategies. Provided supportive therapy.     Interventions: Cognitive Behavioral Therapy and Insight-Oriented  Diagnosis:  F43.22  Plan of Care: Recommend ongoing therapy.  Plan to meet monthly.  Pt is progressing toward treatment goals.   Treatment Plan (Treatment Plan Target Date: 01/07/2024) Client Abilities/Strengths  Pt is bright, engaging and motivated for therapy.   Client Treatment Preferences  Individual therapy.  Client Statement of Needs  Improve coping skills.  Symptoms  Autonomic hyperactivity (e.g., palpitations, shortness of breath, dry mouth, trouble swallowing, nausea, diarrhea). Excessive and/or unrealistic worry that is difficult to control occurring more days than not for at least 6 months about a number of events or activities. Hypervigilance (e.g., feeling constantly on edge, experiencing concentration difficulties, having trouble falling or staying asleep, exhibiting a general state of irritability). Motor tension (e.g., restlessness, tiredness, shakiness, muscle tension). Problems Addressed  Anxiety Goals 1. Enhance ability to effectively cope with the full variety of life's worries and anxieties. 2. Learn and implement coping skills that result in a reduction of anxiety and worry, and improved daily functioning. Objective Learn to accept limitations in life and commit to tolerating, rather than avoiding, unpleasant emotions while accomplishing meaningful goals. Target Date: 2024-01-07 Frequency: Monthly Progress: 50 Modality: individual Related Interventions 1. Use techniques from Acceptance and Commitment Therapy to help client accept uncomfortable realities such as lack of complete control, imperfections, and uncertainty and tolerate unpleasant emotions and thoughts in order to accomplish value-consistent goals. Objective Learn and implement problem-solving strategies for realistically addressing worries. Target Date: 2024-01-07 Frequency: Monthly Progress: 50 Modality: individual Related Interventions 1. Assign the client a homework exercise in which he/she problem-solves a current problem.  review, reinforce success, and provide corrective feedback toward improvement. 2. Teach the client problem-solving strategies involving specifically defining a problem, generating options for addressing it, evaluating the pros and cons of each  option, selecting and implementing  an optional action, and reevaluating and refining the action. Objective Learn and implement calming skills to reduce overall anxiety and manage anxiety symptoms. Target Date: 2024-01-07 Frequency: Monthly Progress: 50 Modality: individual Related Interventions 1. Assign the client to read about progressive muscle relaxation and other calming strategies in relevant books or treatment manuals (e.g., Progressive Relaxation Training by Robb Matar and Alen Blew; Mastery of Your Anxiety and Worry: Workbook by Earlie Counts). 2. Assign the client homework each session in which he/she practices relaxation exercises daily, gradually applying them progressively from non-anxiety-provoking to anxiety-provoking situations; review and reinforce success while providing corrective feedback toward improvement. 3. Teach the client calming/relaxation skills (e.g., applied relaxation, progressive muscle relaxation, cue controlled relaxation; mindful breathing; biofeedback) and how to discriminate better between relaxation and tension; teach the client how to apply these skills to his/her daily life. 3. Reduce overall frequency, intensity, and duration of the anxiety so that daily functioning is not impaired. 4. Resolve the core conflict that is the source of anxiety. 5. Stabilize anxiety level while increasing ability to function on a daily basis. Diagnosis :    F43.22  Conditions For Discharge Achievement of treatment goals and objectives.  Yahshua Thibault, LCSW

## 2023-03-11 ENCOUNTER — Ambulatory Visit (INDEPENDENT_AMBULATORY_CARE_PROVIDER_SITE_OTHER): Payer: 59 | Admitting: Psychology

## 2023-03-11 DIAGNOSIS — F4322 Adjustment disorder with anxiety: Secondary | ICD-10-CM

## 2023-03-11 NOTE — Progress Notes (Signed)
[facility] Counselor/Therapist Progress Note  [name], MRN: [id number],    Date: 03/11/2023  Time Spent: 9:00am-9:55am   55 minutes   Treatment Type: Individual Therapy  Reported Symptoms: stress  Mental Status Exam: Appearance:  Casual     Behavior: Appropriate  Motor: Normal  Speech/Language:  Normal Rate  Affect: Appropriate  Mood: normal  Thought process: normal  Thought content:   WNL  Sensory/Perceptual disturbances:   WNL  Orientation: oriented to person, place, time/date, and situation  Attention: Good  Concentration: Good  Memory: WNL  Fund of knowledge:  Good  Insight:   Good  Judgment:  Good  Impulse Control: Good   Risk Assessment: Danger to Self:  No Self-injurious Behavior: No Danger to Others: No Duty to Warn:no Physical Aggression / Violence:No  Access to Firearms a concern: No  Gang Involvement:No   Subjective: Pt present for face-to-face individual therapy via video.  Pt consents to telehealth video session and is aware of limitations of virtual sessions. Location of pt: home Location of therapist: home office.   Pt talked about being in her summer schedule and glad that she can be home.   Pt talked about her trip to [place] to visit family.  Addressed family interactions and how they impacted pt.   Pt talked about concerns about her sons as they begin to get interested in dating.  Worked on issues regarding letting go and keeping healthy boundaries.  Worked on self care strategies. Provided supportive therapy.     Interventions: Cognitive Behavioral Therapy and Insight-Oriented  Diagnosis:  F43.22  Plan of Care: Recommend ongoing therapy.  Plan to meet monthly.  Pt is progressing toward treatment goals.   Treatment Plan (Treatment Plan Target Date: 01/07/2024) Client Abilities/Strengths  Pt is bright, engaging and motivated for therapy.  Client Treatment Preferences  Individual therapy.  Client Statement of  Needs  Improve coping skills.  Symptoms  Autonomic hyperactivity (e.g., palpitations, shortness of breath, dry mouth, trouble swallowing, nausea, diarrhea). Excessive and/or unrealistic worry that is difficult to control occurring more days than not for at least 6 months about a number of events or activities. Hypervigilance (e.g., feeling constantly on edge, experiencing concentration difficulties, having trouble falling or staying asleep, exhibiting a general state of irritability). Motor tension (e.g., restlessness, tiredness, shakiness, muscle tension). Problems Addressed  Anxiety Goals 1. Enhance ability to effectively cope with the full variety of life's worries and anxieties. 2. Learn and implement coping skills that result in a reduction of anxiety and worry, and improved daily functioning. Objective Learn to accept limitations in life and commit to tolerating, rather than avoiding, unpleasant emotions while accomplishing meaningful goals. Target Date: 2024-01-07 Frequency: Monthly Progress: 50 Modality: individual Related Interventions 1. Use techniques from Acceptance and Commitment Therapy to help client accept uncomfortable realities such as lack of complete control, imperfections, and uncertainty and tolerate unpleasant emotions and thoughts in order to accomplish value-consistent goals. Objective Learn and implement problem-solving strategies for realistically addressing worries. Target Date: 2024-01-07 Frequency: Monthly Progress: 50 Modality: individual Related Interventions 1. Assign the client a homework exercise in which he/she problem-solves a current problem.  review, reinforce success, and provide corrective feedback toward improvement. 2. Teach the client problem-solving strategies involving specifically defining a problem, generating options for addressing it, evaluating the pros and cons of each option, selecting and implementing an optional action, and reevaluating and  refining the action. Objective Learn and implement calming skills to reduce overall anxiety and manage anxiety  symptoms. Target Date: 2024-01-07 Frequency: Monthly Progress: 50 Modality: individual Related Interventions 1. Assign the client to read about progressive muscle relaxation and other calming strategies in relevant books or treatment manuals (e.g., Progressive Relaxation Training by Robb Matar and Alen Blew; Mastery of Your Anxiety and Worry: Workbook by Earlie Counts). 2. Assign the client homework each session in which he/she practices relaxation exercises daily, gradually applying them progressively from non-anxiety-provoking to anxiety-provoking situations; review and reinforce success while providing corrective feedback toward improvement. 3. Teach the client calming/relaxation skills (e.g., applied relaxation, progressive muscle relaxation, cue controlled relaxation; mindful breathing; biofeedback) and how to discriminate better between relaxation and tension; teach the client how to apply these skills to his/her daily life. 3. Reduce overall frequency, intensity, and duration of the anxiety so that daily functioning is not impaired. 4. Resolve the core conflict that is the source of anxiety. 5. Stabilize anxiety level while increasing ability to function on a daily basis. Diagnosis :    F43.22  Conditions For Discharge Achievement of treatment goals and objectives.  Sobia Karger, LCSW

## 2023-04-16 ENCOUNTER — Ambulatory Visit (INDEPENDENT_AMBULATORY_CARE_PROVIDER_SITE_OTHER): Payer: 59 | Admitting: Psychology

## 2023-04-16 DIAGNOSIS — F4322 Adjustment disorder with anxiety: Secondary | ICD-10-CM | POA: Diagnosis not present

## 2023-04-16 NOTE — Progress Notes (Signed)
[facility] Counselor/Therapist Progress Note  [name], MRN: [id number],    Date: 04/16/2023  Time Spent: 10:00am-10:55am   55 minutes   Treatment Type: Individual Therapy  Reported Symptoms: stress  Mental Status Exam: Appearance:  Casual     Behavior: Appropriate  Motor: Normal  Speech/Language:  Normal Rate  Affect: Appropriate  Mood: normal  Thought process: normal  Thought content:   WNL  Sensory/Perceptual disturbances:   WNL  Orientation: oriented to person, place, time/date, and situation  Attention: Good  Concentration: Good  Memory: WNL  Fund of knowledge:  Good  Insight:   Good  Judgment:  Good  Impulse Control: Good   Risk Assessment: Danger to Self:  No Self-injurious Behavior: No Danger to Others: No Duty to Warn:no Physical Aggression / Violence:No  Access to Firearms a concern: No  Gang Involvement:No   Subjective: Pt present for face-to-face individual therapy via video.  Pt consents to telehealth video session and is aware of limitations of virtual sessions. Location of pt: home Location of therapist: home office.   Pt talked about having a "crazy month".   Her son [name] had to have a procedure on the eye that got scratched.   Pt's other son Casimiro Needle got in a motorcycle accident and broke his ankle and needed surgery.   Addressed pt's concerns and worries about her sons.  Address how this stress impacted her and her anxiety.   Pt talked about the stress of this fall regarding school plans for her sons.   She is feeling so much stress that she is getting migraines.   Worked on Optician, dispensing.   Worked on self care strategies. Provided supportive therapy.     Interventions: Cognitive Behavioral Therapy and Insight-Oriented  Diagnosis:  F43.22  Plan of Care: Recommend ongoing therapy.  Plan to meet monthly.  Pt is progressing toward treatment goals.   Treatment Plan (Treatment Plan Target Date:  01/07/2024) Client Abilities/Strengths  Pt is bright, engaging and motivated for therapy.  Client Treatment Preferences  Individual therapy.  Client Statement of Needs  Improve coping skills.  Symptoms  Autonomic hyperactivity (e.g., palpitations, shortness of breath, dry mouth, trouble swallowing, nausea, diarrhea). Excessive and/or unrealistic worry that is difficult to control occurring more days than not for at least 6 months about a number of events or activities. Hypervigilance (e.g., feeling constantly on edge, experiencing concentration difficulties, having trouble falling or staying asleep, exhibiting a general state of irritability). Motor tension (e.g., restlessness, tiredness, shakiness, muscle tension). Problems Addressed  Anxiety Goals 1. Enhance ability to effectively cope with the full variety of life's worries and anxieties. 2. Learn and implement coping skills that result in a reduction of anxiety and worry, and improved daily functioning. Objective Learn to accept limitations in life and commit to tolerating, rather than avoiding, unpleasant emotions while accomplishing meaningful goals. Target Date: 2024-01-07 Frequency: Monthly Progress: 50 Modality: individual Related Interventions 1. Use techniques from Acceptance and Commitment Therapy to help client accept uncomfortable realities such as lack of complete control, imperfections, and uncertainty and tolerate unpleasant emotions and thoughts in order to accomplish value-consistent goals. Objective Learn and implement problem-solving strategies for realistically addressing worries. Target Date: 2024-01-07 Frequency: Monthly Progress: 50 Modality: individual Related Interventions 1. Assign the client a homework exercise in which he/she problem-solves a current problem.  review, reinforce success, and provide corrective feedback toward improvement. 2. Teach the client problem-solving strategies involving specifically  defining a problem, generating options for addressing it,  evaluating the pros and cons of each option, selecting and implementing an optional action, and reevaluating and refining the action. Objective Learn and implement calming skills to reduce overall anxiety and manage anxiety symptoms. Target Date: 2024-01-07 Frequency: Monthly Progress: 50 Modality: individual Related Interventions 1. Assign the client to read about progressive muscle relaxation and other calming strategies in relevant books or treatment manuals (e.g., Progressive Relaxation Training by Robb Matar and Alen Blew; Mastery of Your Anxiety and Worry: Workbook by Earlie Counts). 2. Assign the client homework each session in which he/she practices relaxation exercises daily, gradually applying them progressively from non-anxiety-provoking to anxiety-provoking situations; review and reinforce success while providing corrective feedback toward improvement. 3. Teach the client calming/relaxation skills (e.g., applied relaxation, progressive muscle relaxation, cue controlled relaxation; mindful breathing; biofeedback) and how to discriminate better between relaxation and tension; teach the client how to apply these skills to his/her daily life. 3. Reduce overall frequency, intensity, and duration of the anxiety so that daily functioning is not impaired. 4. Resolve the core conflict that is the source of anxiety. 5. Stabilize anxiety level while increasing ability to function on a daily basis. Diagnosis :    F43.22  Conditions For Discharge Achievement of treatment goals and objectives.  Katessa Attridge, LCSW

## 2023-05-05 ENCOUNTER — Other Ambulatory Visit: Payer: Self-pay | Admitting: Family

## 2023-05-05 DIAGNOSIS — Z9189 Other specified personal risk factors, not elsewhere classified: Secondary | ICD-10-CM

## 2023-05-13 ENCOUNTER — Ambulatory Visit: Payer: 59 | Admitting: Psychology

## 2023-05-13 DIAGNOSIS — F4322 Adjustment disorder with anxiety: Secondary | ICD-10-CM | POA: Diagnosis not present

## 2023-05-13 NOTE — Progress Notes (Signed)
[facility] Counselor/Therapist Progress Note  Patient ID: Terah Giaimo, MRN: [id number],    Date: 05/13/2023  Time Spent: 9:00am-9:55am   55 minutes   Treatment Type: Individual Therapy  Reported Symptoms: stress  Mental Status Exam: Appearance:  Casual     Behavior: Appropriate  Motor: Normal  Speech/Language:  Normal Rate  Affect: Appropriate  Mood: normal  Thought process: normal  Thought content:   WNL  Sensory/Perceptual disturbances:   WNL  Orientation: oriented to person, place, time/date, and situation  Attention: Good  Concentration: Good  Memory: WNL  Fund of knowledge:  Good  Insight:   Good  Judgment:  Good  Impulse Control: Good   Risk Assessment: Danger to Self:  No Self-injurious Behavior: No Danger to Others: No Duty to Warn:no Physical Aggression / Violence:No  Access to Firearms a concern: No  Gang Involvement:No   Subjective: Pt present for face-to-face individual therapy via video.  Pt consents to telehealth video session and is aware of limitations of virtual sessions. Location of pt: home Location of therapist: home office.   Pt talked about being back at [facility] during the week with her son [name].   It is overwhelming for pt to have to be there and split time between [facility] and home.   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   She is worried about learning all the online platforms.   Pt talked about issues with her friend.  Addressed their interactions and helped pt process her feelings and relationship dynamics.  Worked on self care strategies. Provided supportive therapy.     Interventions: Cognitive Behavioral Therapy and Insight-Oriented  Diagnosis:  F43.22  Plan of Care: Recommend ongoing therapy.  Plan to meet monthly.  Pt is progressing toward treatment goals.   Treatment Plan (Treatment Plan  Target Date: 01/07/2024) Client Abilities/Strengths  Pt is bright, engaging and motivated for therapy.  Client Treatment Preferences  Individual therapy.  Client Statement of Needs  Improve coping skills.  Symptoms  Autonomic hyperactivity (e.g., palpitations, shortness of breath, dry mouth, trouble swallowing, nausea, diarrhea). Excessive and/or unrealistic worry that is difficult to control occurring more days than not for at least 6 months about a number of events or activities. Hypervigilance (e.g., feeling constantly on edge, experiencing concentration difficulties, having trouble falling or staying asleep, exhibiting a general state of irritability). Motor tension (e.g., restlessness, tiredness, shakiness, muscle tension). Problems Addressed  Anxiety Goals 1. Enhance ability to effectively cope with the full variety of life's worries and anxieties. 2. Learn and implement coping skills that result in a reduction of anxiety and worry, and improved daily functioning. Objective Learn to accept limitations in life and commit to tolerating, rather than avoiding, unpleasant emotions while accomplishing meaningful goals. Target Date: 2024-01-07 Frequency: Monthly Progress: 50 Modality: individual Related Interventions 1. Use techniques from Acceptance and Commitment Therapy to help client accept uncomfortable realities such as lack of complete control, imperfections, and uncertainty and tolerate unpleasant emotions and thoughts in order to accomplish value-consistent goals. Objective Learn and implement problem-solving strategies for realistically addressing worries. Target Date: 2024-01-07 Frequency: Monthly Progress: 50 Modality: individual Related Interventions 1. Assign the client a homework exercise in which he/she problem-solves a current problem.  review, reinforce success, and provide corrective feedback toward improvement. 2. Teach the client problem-solving strategies involving  specifically defining a problem, generating options  for addressing it, evaluating the pros and cons of each option, selecting and implementing an optional action, and reevaluating and refining the action. Objective Learn and implement calming skills to reduce overall anxiety and manage anxiety symptoms. Target Date: 2024-01-07 Frequency: Monthly Progress: 50 Modality: individual Related Interventions 1. Assign the client to read about progressive muscle relaxation and other calming strategies in relevant books or treatment manuals (e.g., Progressive Relaxation Training by Robb Matar and Alen Blew; Mastery of Your Anxiety and Worry: Workbook by Earlie Counts). 2. Assign the client homework each session in which he/she practices relaxation exercises daily, gradually applying them progressively from non-anxiety-provoking to anxiety-provoking situations; review and reinforce success while providing corrective feedback toward improvement. 3. Teach the client calming/relaxation skills (e.g., applied relaxation, progressive muscle relaxation, cue controlled relaxation; mindful breathing; biofeedback) and how to discriminate better between relaxation and tension; teach the client how to apply these skills to his/her daily life. 3. Reduce overall frequency, intensity, and duration of the anxiety so that daily functioning is not impaired. 4. Resolve the core conflict that is the source of anxiety. 5. Stabilize anxiety level while increasing ability to function on a daily basis. Diagnosis :    F43.22  Conditions For Discharge Achievement of treatment goals and objectives.  Jag Lenz, LCSW

## 2023-06-10 ENCOUNTER — Ambulatory Visit (INDEPENDENT_AMBULATORY_CARE_PROVIDER_SITE_OTHER): Payer: 59 | Admitting: Psychology

## 2023-06-10 DIAGNOSIS — F4322 Adjustment disorder with anxiety: Secondary | ICD-10-CM | POA: Diagnosis not present

## 2023-06-10 NOTE — Progress Notes (Signed)
[facility] Counselor/Therapist Progress Note  [name], MRN: [id number],    Date: 06/10/2023  Time Spent: 9:00am-9:55am   55 minutes   Treatment Type: Individual Therapy  Reported Symptoms: stress  Mental Status Exam: Appearance:  Casual     Behavior: Appropriate  Motor: Normal  Speech/Language:  Normal Rate  Affect: Appropriate  Mood: normal  Thought process: normal  Thought content:   WNL  Sensory/Perceptual disturbances:   WNL  Orientation: oriented to person, place, time/date, and situation  Attention: Good  Concentration: Good  Memory: WNL  Fund of knowledge:  Good  Insight:   Good  Judgment:  Good  Impulse Control: Good   Risk Assessment: Danger to Self:  No Self-injurious Behavior: No Danger to Others: No Duty to Warn:no Physical Aggression / Violence:No  Access to Firearms a concern: No  Gang Involvement:No   Subjective: Pt present for face-to-face individual therapy via video.  Pt consents to telehealth video session and is aware of limitations and benefits of virtual sessions. Location of pt: home Location of therapist: home office.   Pt talked about being back at [facility] during the week with her son [name].   It is overwhelming for pt to have to be there and split time between [facility] and home.   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    Pt talked about a conversation with her younger sister.   She felt judged during that conversation.   Addressed the interaction and how it impacted pt.   Sometimes pt feels in competition with her sister.  Helped pt process her feelings and relationship dynamics.  Addressed what wounded parts of pt get triggered and how she can increase self compassion. Worked on self care strategies. Provided supportive therapy.     Interventions: Cognitive Behavioral Therapy and Insight-Oriented  Diagnosis:  F43.22  Plan of Care: Recommend ongoing therapy.  Plan to meet monthly.  Pt is  progressing toward treatment goals.   Treatment Plan (Treatment Plan Target Date: 01/07/2024) Client Abilities/Strengths  Pt is bright, engaging and motivated for therapy.  Client Treatment Preferences  Individual therapy.  Client Statement of Needs  Improve coping skills.  Symptoms  Autonomic hyperactivity (e.g., palpitations, shortness of breath, dry mouth, trouble swallowing, nausea, diarrhea). Excessive and/or unrealistic worry that is difficult to control occurring more days than not for at least 6 months about a number of events or activities. Hypervigilance (e.g., feeling constantly on edge, experiencing concentration difficulties, having trouble falling or staying asleep, exhibiting a general state of irritability). Motor tension (e.g., restlessness, tiredness, shakiness, muscle tension). Problems Addressed  Anxiety Goals 1. Enhance ability to effectively cope with the full variety of life's worries and anxieties. 2. Learn and implement coping skills that result in a reduction of anxiety and worry, and improved daily functioning. Objective Learn to accept limitations in life and commit to tolerating, rather than avoiding, unpleasant emotions while accomplishing meaningful goals. Target Date: 2024-01-07 Frequency: Monthly Progress: 50 Modality: individual Related Interventions 1. Use techniques from Acceptance and Commitment Therapy to help client accept uncomfortable realities such as lack of complete control, imperfections, and uncertainty and tolerate unpleasant emotions and thoughts in order to accomplish value-consistent goals. Objective Learn and implement problem-solving strategies for realistically addressing worries. Target Date: 2024-01-07 Frequency: Monthly Progress: 50 Modality: individual Related Interventions 1. Assign the client a homework exercise in which he/she problem-solves a current problem.  review, reinforce success, and provide corrective feedback toward  improvement. 2. Teach  the client problem-solving strategies involving specifically defining a problem, generating options for addressing it, evaluating the pros and cons of each option, selecting and implementing an optional action, and reevaluating and refining the action. Objective Learn and implement calming skills to reduce overall anxiety and manage anxiety symptoms. Target Date: 2024-01-07 Frequency: Monthly Progress: 50 Modality: individual Related Interventions 1. Assign the client to read about progressive muscle relaxation and other calming strategies in relevant books or treatment manuals (e.g., Progressive Relaxation Training by Robb Matar and Alen Blew; Mastery of Your Anxiety and Worry: Workbook by Earlie Counts). 2. Assign the client homework each session in which he/she practices relaxation exercises daily, gradually applying them progressively from non-anxiety-provoking to anxiety-provoking situations; review and reinforce success while providing corrective feedback toward improvement. 3. Teach the client calming/relaxation skills (e.g., applied relaxation, progressive muscle relaxation, cue controlled relaxation; mindful breathing; biofeedback) and how to discriminate better between relaxation and tension; teach the client how to apply these skills to his/her daily life. 3. Reduce overall frequency, intensity, and duration of the anxiety so that daily functioning is not impaired. 4. Resolve the core conflict that is the source of anxiety. 5. Stabilize anxiety level while increasing ability to function on a daily basis. Diagnosis :    F43.22  Conditions For Discharge Achievement of treatment goals and objectives.  Breshae Belcher, LCSW

## 2023-07-15 ENCOUNTER — Ambulatory Visit: Payer: 59 | Admitting: Psychology

## 2023-07-15 DIAGNOSIS — F4322 Adjustment disorder with anxiety: Secondary | ICD-10-CM
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
## 2023-08-12 ENCOUNTER — Ambulatory Visit: Payer: 59 | Admitting: Psychology

## 2023-08-12 DIAGNOSIS — F4322 Adjustment disorder with anxiety: Secondary | ICD-10-CM

## 2023-08-12 NOTE — Progress Notes (Signed)
[facility] Counselor/Therapist Progress Note  [name], MRN: [id number],    Date: 08/12/2023  Time Spent: 9:00am-9:55am   55 minutes   Treatment Type: Individual Therapy  Reported Symptoms: stress  Mental Status Exam: Appearance:  Casual     Behavior: Appropriate  Motor: Normal  Speech/Language:  Normal Rate  Affect: Appropriate  Mood: normal  Thought process: normal  Thought content:   WNL  Sensory/Perceptual disturbances:   WNL  Orientation: oriented to person, place, time/date, and situation  Attention: Good  Concentration: Good  Memory: WNL  Fund of knowledge:  Good  Insight:   Good  Judgment:  Good  Impulse Control: Good   Risk Assessment: Danger to Self:  No Self-injurious Behavior: No Danger to Others: No Duty to Warn:no Physical Aggression / Violence:No  Access to Firearms a concern: No  Gang Involvement:No   Subjective: Pt present for face-to-face individual therapy via video.  Pt consents to telehealth video session and is aware of limitations and benefits of virtual sessions. Location of pt: home Location of therapist: home office.   Pt talked about being at [facility] with her son April Bridges.   The semester has been hard and there is a lot of work as they approach the end of the semester.  Addressed how this has impacted pt.   [name]'s disability makes things much more challenging.  Pt plans to have a talk with April Bridges about graduate school plans moving forward.   It is hard for pt to be away from home and be at [facility] to help [name].   Pt talked about her family.   Her husband's family may come to visit over the holidays and stay in pt's rental house next door.  Pt has anxiety about the visit bc of family dynamics.  Helped pt process her feelings and family dynamics.   Addressed how she can increase self compassion. Worked on self care strategies. Provided supportive therapy.     Interventions: Cognitive Behavioral Therapy and  Insight-Oriented  Diagnosis:  F43.22  Plan of Care: Recommend ongoing therapy.  Plan to meet monthly.  Pt is progressing toward treatment goals.   Treatment Plan (Treatment Plan Target Date: 01/07/2024) Client Abilities/Strengths  Pt is bright, engaging and motivated for therapy.  Client Treatment Preferences  Individual therapy.  Client Statement of Needs  Improve coping skills.  Symptoms  Autonomic hyperactivity (e.g., palpitations, shortness of breath, dry mouth, trouble swallowing, nausea, diarrhea). Excessive and/or unrealistic worry that is difficult to control occurring more days than not for at least 6 months about a number of events or activities. Hypervigilance (e.g., feeling constantly on edge, experiencing concentration difficulties, having trouble falling or staying asleep, exhibiting a general state of irritability). Motor tension (e.g., restlessness, tiredness, shakiness, muscle tension). Problems Addressed  Anxiety Goals 1. Enhance ability to effectively cope with the full variety of life's worries and anxieties. 2. Learn and implement coping skills that result in a reduction of anxiety and worry, and improved daily functioning. Objective Learn to accept limitations in life and commit to tolerating, rather than avoiding, unpleasant emotions while accomplishing meaningful goals. Target Date: 2024-01-07 Frequency: Monthly Progress: 50 Modality: individual Related Interventions 1. Use techniques from Acceptance and Commitment Therapy to help client accept uncomfortable realities such as lack of complete control, imperfections, and uncertainty and tolerate unpleasant emotions and thoughts in order to accomplish value-consistent goals. Objective Learn and implement problem-solving strategies for realistically addressing worries. Target Date: 2024-01-07 Frequency: Monthly Progress: 50 Modality:  individual Related Interventions 1. Assign the client a homework exercise in which  he/she problem-solves a current problem.  review, reinforce success, and provide corrective feedback toward improvement. 2. Teach the client problem-solving strategies involving specifically defining a problem, generating options for addressing it, evaluating the pros and cons of each option, selecting and implementing an optional action, and reevaluating and refining the action. Objective Learn and implement calming skills to reduce overall anxiety and manage anxiety symptoms. Target Date: 2024-01-07 Frequency: Monthly Progress: 50 Modality: individual Related Interventions 1. Assign the client to read about progressive muscle relaxation and other calming strategies in relevant books or treatment manuals (e.g., Progressive Relaxation Training by Robb Matar and Alen Blew; Mastery of Your Anxiety and Worry: Workbook by Earlie Counts). 2. Assign the client homework each session in which he/she practices relaxation exercises daily, gradually applying them progressively from non-anxiety-provoking to anxiety-provoking situations; review and reinforce success while providing corrective feedback toward improvement. 3. Teach the client calming/relaxation skills (e.g., applied relaxation, progressive muscle relaxation, cue controlled relaxation; mindful breathing; biofeedback) and how to discriminate better between relaxation and tension; teach the client how to apply these skills to his/her daily life. 3. Reduce overall frequency, intensity, and duration of the anxiety so that daily functioning is not impaired. 4. Resolve the core conflict that is the source of anxiety. 5. Stabilize anxiety level while increasing ability to function on a daily basis. Diagnosis :    F43.22  Conditions For Discharge Achievement of treatment goals and objectives.  Mercedies Ganesh, LCSW

## 2023-09-07 ENCOUNTER — Ambulatory Visit: Payer: 59 | Admitting: Psychology

## 2023-09-07 DIAGNOSIS — F4322 Adjustment disorder with anxiety: Secondary | ICD-10-CM | POA: Diagnosis not present

## 2023-09-07 NOTE — Progress Notes (Signed)
[facility] Counselor/Therapist Progress Note  Patient ID: Katelee Gimeno, MRN: [id number],    Date: 09/07/2023  Time Spent: 9:00am-9:55am   55 minutes   Treatment Type: Individual Therapy  Reported Symptoms: stress  Mental Status Exam: Appearance:  Casual     Behavior: Appropriate  Motor: Normal  Speech/Language:  Normal Rate  Affect: Appropriate  Mood: normal  Thought process: normal  Thought content:   WNL  Sensory/Perceptual disturbances:   WNL  Orientation: oriented to person, place, time/date, and situation  Attention: Good  Concentration: Good  Memory: WNL  Fund of knowledge:  Good  Insight:   Good  Judgment:  Good  Impulse Control: Good   Risk Assessment: Danger to Self:  No Self-injurious Behavior: No Danger to Others: No Duty to Warn:no Physical Aggression / Violence:No  Access to Firearms a concern: No  Gang Involvement:No   Subjective: Pt present for face-to-face individual therapy via video.  Pt consents to telehealth video session and is aware of limitations and benefits of virtual sessions. Location of pt: home Location of therapist: home office.   Pt talked about  the holidays.  She has a month off from [employer] college schedule and pt is glad to be home.   Addressed how pt can use the time so it is restorative for her.  Pt talked about her family.   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  Pt is feeling weary.  Helped pt process her feelings and family dynamics.   Addressed how she can increase self compassion. Pt talked about her friend Market researcher.   That relationship can be complex.   Addressed the issues and helped pt problem solve communication. Worked on self care strategies. Provided supportive therapy.     Interventions: Cognitive Behavioral Therapy and Insight-Oriented  Diagnosis:  F43.22  Plan of Care: Recommend ongoing therapy.  Plan to meet monthly.  Pt is progressing toward treatment goals.    Treatment Plan (Treatment Plan Target Date: 01/07/2024) Client Abilities/Strengths  Pt is bright, engaging and motivated for therapy.  Client Treatment Preferences  Individual therapy.  Client Statement of Needs  Improve coping skills.  Symptoms  Autonomic hyperactivity (e.g., palpitations, shortness of breath, dry mouth, trouble swallowing, nausea, diarrhea). Excessive and/or unrealistic worry that is difficult to control occurring more days than not for at least 6 months about a number of events or activities. Hypervigilance (e.g., feeling constantly on edge, experiencing concentration difficulties, having trouble falling or staying asleep, exhibiting a general state of irritability). Motor tension (e.g., restlessness, tiredness, shakiness, muscle tension). Problems Addressed  Anxiety Goals 1. Enhance ability to effectively cope with the full variety of life's worries and anxieties. 2. Learn and implement coping skills that result in a reduction of anxiety and worry, and improved daily functioning. Objective Learn to accept limitations in life and commit to tolerating, rather than avoiding, unpleasant emotions while accomplishing meaningful goals. Target Date: 2024-01-07 Frequency: Monthly Progress: 50 Modality: individual Related Interventions 1. Use techniques from Acceptance and Commitment Therapy to help client accept uncomfortable realities such as lack of complete control, imperfections, and uncertainty and tolerate unpleasant emotions and thoughts in order to accomplish value-consistent goals. Objective Learn and implement problem-solving strategies for realistically addressing worries. Target Date: 2024-01-07 Frequency: Monthly Progress: 50 Modality: individual Related Interventions 1. Assign the client a homework exercise in which he/she problem-solves a current problem.  review, reinforce success, and provide corrective feedback toward improvement. 2. Teach the client  problem-solving strategies  involving specifically defining a problem, generating options for addressing it, evaluating the pros and cons of each option, selecting and implementing an optional action, and reevaluating and refining the action. Objective Learn and implement calming skills to reduce overall anxiety and manage anxiety symptoms. Target Date: 2024-01-07 Frequency: Monthly Progress: 50 Modality: individual Related Interventions 1. Assign the client to read about progressive muscle relaxation and other calming strategies in relevant books or treatment manuals (e.g., Progressive Relaxation Training by Robb Matar and Alen Blew; Mastery of Your Anxiety and Worry: Workbook by Earlie Counts). 2. Assign the client homework each session in which he/she practices relaxation exercises daily, gradually applying them progressively from non-anxiety-provoking to anxiety-provoking situations; review and reinforce success while providing corrective feedback toward improvement. 3. Teach the client calming/relaxation skills (e.g., applied relaxation, progressive muscle relaxation, cue controlled relaxation; mindful breathing; biofeedback) and how to discriminate better between relaxation and tension; teach the client how to apply these skills to his/her daily life. 3. Reduce overall frequency, intensity, and duration of the anxiety so that daily functioning is not impaired. 4. Resolve the core conflict that is the source of anxiety. 5. Stabilize anxiety level while increasing ability to function on a daily basis. Diagnosis :    F43.22  Conditions For Discharge Achievement of treatment goals and objectives.  Rashelle Ireland, LCSW

## 2023-09-09 ENCOUNTER — Ambulatory Visit
Admission: RE | Admit: 2023-09-09 | Discharge: 2023-09-09 | Disposition: A | Discharge status: Home / Self Care | Payer: Managed Care, Other (non HMO) | Source: Ambulatory Visit | Attending: Family | Admitting: Family

## 2023-09-09 DIAGNOSIS — Z9189 Other specified personal risk factors, not elsewhere classified: Secondary | ICD-10-CM

## 2023-09-09 MED ORDER — GADOPICLENOL 0.5 MMOL/ML IV SOLN
6.0000 mL | Freq: Once | INTRAVENOUS | Status: AC | PRN
  Administered 2023-09-09: 6 mL via INTRAVENOUS

## 2023-10-07 ENCOUNTER — Ambulatory Visit: Payer: 59 | Admitting: Psychology

## 2023-10-07 DIAGNOSIS — F4322 Adjustment disorder with anxiety: Secondary | ICD-10-CM

## 2023-10-07 NOTE — Progress Notes (Signed)
[facility] Counselor/Therapist Progress Note  [name], MRN: [id number],    Date: 10/07/2023  Time Spent: 9:00am-9:55am   55 minutes   Treatment Type: Individual Therapy  Reported Symptoms: stress  Mental Status Exam: Appearance:  Casual     Behavior: Appropriate  Motor: Normal  Speech/Language:  Normal Rate  Affect: Appropriate  Mood: normal  Thought process: normal  Thought content:   WNL  Sensory/Perceptual disturbances:   WNL  Orientation: oriented to person, place, time/date, and situation  Attention: Good  Concentration: Good  Memory: WNL  Fund of knowledge:  Good  Insight:   Good  Judgment:  Good  Impulse Control: Good   Risk Assessment: Danger to Self:  No Self-injurious Behavior: No Danger to Others: No Duty to Warn:no Physical Aggression / Violence:No  Access to Firearms a concern: No  Gang Involvement:No   Subjective: Pt present for face-to-face individual therapy via video.  Pt consents to telehealth video session and is aware of limitations and benefits of virtual sessions. Location of pt: home Location of therapist: home office.   Pt talked about issues with friends.  Pt states she tends to connect with needy or condenscending people.  This impacts her bc their statements touch the wounded part of herself.   Addressed this wounded part and identified that it feels like she is "broken, not good enough."   Pt tends to be very critical of herself for having a wounded part and judges herself.  Worked with pt on how she can give compassion and acceptance to the wounded parts of herself.   Worked on self care strategies. Provided supportive therapy.     Interventions: Cognitive Behavioral Therapy and Insight-Oriented  Diagnosis:  F43.22  Plan of Care: Recommend ongoing therapy.  Plan to meet monthly.  Pt is progressing toward treatment goals.   Treatment Plan (Treatment Plan Target Date: 01/07/2024) Client  Abilities/Strengths  Pt is bright, engaging and motivated for therapy.  Client Treatment Preferences  Individual therapy.  Client Statement of Needs  Improve coping skills.  Symptoms  Autonomic hyperactivity (e.g., palpitations, shortness of breath, dry mouth, trouble swallowing, nausea, diarrhea). Excessive and/or unrealistic worry that is difficult to control occurring more days than not for at least 6 months about a number of events or activities. Hypervigilance (e.g., feeling constantly on edge, experiencing concentration difficulties, having trouble falling or staying asleep, exhibiting a general state of irritability). Motor tension (e.g., restlessness, tiredness, shakiness, muscle tension). Problems Addressed  Anxiety Goals 1. Enhance ability to effectively cope with the full variety of life's worries and anxieties. 2. Learn and implement coping skills that result in a reduction of anxiety and worry, and improved daily functioning. Objective Learn to accept limitations in life and commit to tolerating, rather than avoiding, unpleasant emotions while accomplishing meaningful goals. Target Date: 2024-01-07 Frequency: Monthly Progress: 50 Modality: individual Related Interventions 1. Use techniques from Acceptance and Commitment Therapy to help client accept uncomfortable realities such as lack of complete control, imperfections, and uncertainty and tolerate unpleasant emotions and thoughts in order to accomplish value-consistent goals. Objective Learn and implement problem-solving strategies for realistically addressing worries. Target Date: 2024-01-07 Frequency: Monthly Progress: 50 Modality: individual Related Interventions 1. Assign the client a homework exercise in which he/she problem-solves a current problem.  review, reinforce success, and provide corrective feedback toward improvement. 2. Teach the client problem-solving strategies involving specifically defining a problem,  generating options for addressing it, evaluating the pros and cons of each option, selecting  and implementing an optional action, and reevaluating and refining the action. Objective Learn and implement calming skills to reduce overall anxiety and manage anxiety symptoms. Target Date: 2024-01-07 Frequency: Monthly Progress: 50 Modality: individual Related Interventions 1. Assign the client to read about progressive muscle relaxation and other calming strategies in relevant books or treatment manuals (e.g., Progressive Relaxation Training by Robb Matar and Alen Blew; Mastery of Your Anxiety and Worry: Workbook by Earlie Counts). 2. Assign the client homework each session in which he/she practices relaxation exercises daily, gradually applying them progressively from non-anxiety-provoking to anxiety-provoking situations; review and reinforce success while providing corrective feedback toward improvement. 3. Teach the client calming/relaxation skills (e.g., applied relaxation, progressive muscle relaxation, cue controlled relaxation; mindful breathing; biofeedback) and how to discriminate better between relaxation and tension; teach the client how to apply these skills to his/her daily life. 3. Reduce overall frequency, intensity, and duration of the anxiety so that daily functioning is not impaired. 4. Resolve the core conflict that is the source of anxiety. 5. Stabilize anxiety level while increasing ability to function on a daily basis. Diagnosis :    F43.22  Conditions For Discharge Achievement of treatment goals and objectives.  Biridiana Twardowski, LCSW

## 2023-11-03 ENCOUNTER — Ambulatory Visit: Payer: Self-pay | Admitting: Psychology

## 2023-11-03 DIAGNOSIS — F4322 Adjustment disorder with anxiety: Secondary | ICD-10-CM | POA: Diagnosis not present

## 2023-11-03 NOTE — Progress Notes (Signed)
[facility] Counselor/Therapist Progress Note  Patient ID: Aiyonna Lucado, MRN: [id number],    Date: 11/03/2023  Time Spent: 11:00am-11:55am   55 minutes   Treatment Type: Individual Therapy  Reported Symptoms: stress  Mental Status Exam: Appearance:  Casual     Behavior: Appropriate  Motor: Normal  Speech/Language:  Normal Rate  Affect: Appropriate  Mood: normal  Thought process: normal  Thought content:   WNL  Sensory/Perceptual disturbances:   WNL  Orientation: oriented to person, place, time/date, and situation  Attention: Good  Concentration: Good  Memory: WNL  Fund of knowledge:  Good  Insight:   Good  Judgment:  Good  Impulse Control: Good   Risk Assessment: Danger to Self:  No Self-injurious Behavior: No Danger to Others: No Duty to Warn:no Physical Aggression / Violence:No  Access to Firearms a concern: No  Gang Involvement:No   Subjective: Pt present for face-to-face individual therapy via video.  Pt consents to telehealth video session and is aware of limitations and benefits of virtual sessions. Location of pt: home Location of therapist: home office.   Pt talked about her son [name] who is experiencing a lot of stress in graduate school.   [name] will see a therapist for the first time next week.  Pt is glad he will have that support.   [name] has muscular dystrophy and pt provides a lot of caregiving support to him.  This has allowed him to achieve all he has done regarding his education. Pt talked about issues with her sister in law.  Pt reached out to her and they had a long phone call that went much better than in the past.  Pt feels she has grown through therapy and used the things she has learned. Pt talked about her sons' plans and indecisiveness.   Pt tends to blame herself for anything that goes less than perfectly with her kids.  Addressed how this is irrational.   Worked with pt on how she can give compassion and acceptance to herself.    Worked on self care strategies. Provided supportive therapy.     Interventions: Cognitive Behavioral Therapy and Insight-Oriented  Diagnosis:  F43.22  Plan of Care: Recommend ongoing therapy.  Plan to meet monthly.  Pt is progressing toward treatment goals.   Treatment Plan (Treatment Plan Target Date: 01/07/2024) Client Abilities/Strengths  Pt is bright, engaging and motivated for therapy.  Client Treatment Preferences  Individual therapy.  Client Statement of Needs  Improve coping skills.  Symptoms  Autonomic hyperactivity (e.g., palpitations, shortness of breath, dry mouth, trouble swallowing, nausea, diarrhea). Excessive and/or unrealistic worry that is difficult to control occurring more days than not for at least 6 months about a number of events or activities. Hypervigilance (e.g., feeling constantly on edge, experiencing concentration difficulties, having trouble falling or staying asleep, exhibiting a general state of irritability). Motor tension (e.g., restlessness, tiredness, shakiness, muscle tension). Problems Addressed  Anxiety Goals 1. Enhance ability to effectively cope with the full variety of life's worries and anxieties. 2. Learn and implement coping skills that result in a reduction of anxiety and worry, and improved daily functioning. Objective Learn to accept limitations in life and commit to tolerating, rather than avoiding, unpleasant emotions while accomplishing meaningful goals. Target Date: 2024-01-07 Frequency: Monthly Progress: 50 Modality: individual Related Interventions 1. Use techniques from Acceptance and Commitment Therapy to help client accept uncomfortable realities such as lack of complete control, imperfections, and uncertainty and tolerate unpleasant emotions and thoughts in order to  accomplish value-consistent goals. Objective Learn and implement problem-solving strategies for realistically addressing worries. Target Date:  2024-01-07 Frequency: Monthly Progress: 50 Modality: individual Related Interventions 1. Assign the client a homework exercise in which he/she problem-solves a current problem.  review, reinforce success, and provide corrective feedback toward improvement. 2. Teach the client problem-solving strategies involving specifically defining a problem, generating options for addressing it, evaluating the pros and cons of each option, selecting and implementing an optional action, and reevaluating and refining the action. Objective Learn and implement calming skills to reduce overall anxiety and manage anxiety symptoms. Target Date: 2024-01-07 Frequency: Monthly Progress: 50 Modality: individual Related Interventions 1. Assign the client to read about progressive muscle relaxation and other calming strategies in relevant books or treatment manuals (e.g., Progressive Relaxation Training by Robb Matar and Alen Blew; Mastery of Your Anxiety and Worry: Workbook by Earlie Counts). 2. Assign the client homework each session in which he/she practices relaxation exercises daily, gradually applying them progressively from non-anxiety-provoking to anxiety-provoking situations; review and reinforce success while providing corrective feedback toward improvement. 3. Teach the client calming/relaxation skills (e.g., applied relaxation, progressive muscle relaxation, cue controlled relaxation; mindful breathing; biofeedback) and how to discriminate better between relaxation and tension; teach the client how to apply these skills to his/her daily life. 3. Reduce overall frequency, intensity, and duration of the anxiety so that daily functioning is not impaired. 4. Resolve the core conflict that is the source of anxiety. 5. Stabilize anxiety level while increasing ability to function on a daily basis. Diagnosis :    F43.22  Conditions For Discharge Achievement of treatment goals and objectives.  Corvette Orser, LCSW

## 2023-11-05 ENCOUNTER — Ambulatory Visit: Payer: Self-pay | Admitting: Psychology

## 2023-12-03 ENCOUNTER — Ambulatory Visit: Payer: 59 | Admitting: Psychology

## 2023-12-03 DIAGNOSIS — F4322 Adjustment disorder with anxiety: Secondary | ICD-10-CM | POA: Diagnosis not present

## 2023-12-03 NOTE — Progress Notes (Signed)
 [facility] Counselor/Therapist Progress Note  [name], MRN: [id number],    Date: 12/03/2023  Time Spent: 9:00am-9:55am   55 minutes   Treatment Type: Individual Therapy  Reported Symptoms: stress  Mental Status Exam: Appearance:  Casual     Behavior: Appropriate  Motor: Normal  Speech/Language:  Normal Rate  Affect: Appropriate  Mood: normal  Thought process: normal  Thought content:   WNL  Sensory/Perceptual disturbances:   WNL  Orientation: oriented to person, place, time/date, and situation  Attention: Good  Concentration: Good  Memory: WNL  Fund of knowledge:  Good  Insight:   Good  Judgment:  Good  Impulse Control: Good   Risk Assessment: Danger to Self:  No Self-injurious Behavior: No Danger to Others: No Duty to Warn:no Physical Aggression / Violence:No  Access to Firearms a concern: No  Gang Involvement:No   Subjective: Pt present for face-to-face individual therapy via video.  Pt consents to telehealth video session and is aware of limitations and benefits of virtual sessions. Location of pt: home Location of therapist: home office.   Pt talked about her son April Bridges being on spring break this week so pt has been able to be home all week.   Pt talked about her parents and they are planning to move to [place] and live in the house on pt's property that they have been fixing up.  Pt has never lived near family in her adult life so she knows this will be an adjustment.   Pt is feeling anxious about the move.  Pt's mother has dementia and her father is 63 years old so pt wants to take care of them when they need more care but realizes this is a big responsibility especially since she already does caregiving for her son April Bridges.   Pt talked about her relationship with her brother.  He does not stay as connected with the family as much as pt thinks he should and she has felt angry.  Helped pt process her feelings and identified that the anger is  defending against the hurt.    Worked on self care strategies. Provided supportive therapy.     Interventions: Cognitive Behavioral Therapy and Insight-Oriented  Diagnosis:  F43.22  Plan of Care: Recommend ongoing therapy.  Plan to meet monthly.  Pt is progressing toward treatment goals.   Treatment Plan (Treatment Plan Target Date: 01/07/2024) Client Abilities/Strengths  Pt is bright, engaging and motivated for therapy.  Client Treatment Preferences  Individual therapy.  Client Statement of Needs  Improve coping skills.  Symptoms  Autonomic hyperactivity (e.g., palpitations, shortness of breath, dry mouth, trouble swallowing, nausea, diarrhea). Excessive and/or unrealistic worry that is difficult to control occurring more days than not for at least 6 months about a number of events or activities. Hypervigilance (e.g., feeling constantly on edge, experiencing concentration difficulties, having trouble falling or staying asleep, exhibiting a general state of irritability). Motor tension (e.g., restlessness, tiredness, shakiness, muscle tension). Problems Addressed  Anxiety Goals 1. Enhance ability to effectively cope with the full variety of life's worries and anxieties. 2. Learn and implement coping skills that result in a reduction of anxiety and worry, and improved daily functioning. Objective Learn to accept limitations in life and commit to tolerating, rather than avoiding, unpleasant emotions while accomplishing meaningful goals. Target Date: 2024-01-07 Frequency: Monthly Progress: 50 Modality: individual Related Interventions 1. Use techniques from Acceptance and Commitment Therapy to help client accept uncomfortable realities such as lack of complete control, imperfections,  and uncertainty and tolerate unpleasant emotions and thoughts in order to accomplish value-consistent goals. Objective Learn and implement problem-solving strategies for realistically addressing  worries. Target Date: 2024-01-07 Frequency: Monthly Progress: 50 Modality: individual Related Interventions 1. Assign the client a homework exercise in which he/she problem-solves a current problem.  review, reinforce success, and provide corrective feedback toward improvement. 2. Teach the client problem-solving strategies involving specifically defining a problem, generating options for addressing it, evaluating the pros and cons of each option, selecting and implementing an optional action, and reevaluating and refining the action. Objective Learn and implement calming skills to reduce overall anxiety and manage anxiety symptoms. Target Date: 2024-01-07 Frequency: Monthly Progress: 50 Modality: individual Related Interventions 1. Assign the client to read about progressive muscle relaxation and other calming strategies in relevant books or treatment manuals (e.g., Progressive Relaxation Training by Robb Matar and Alen Blew; Mastery of Your Anxiety and Worry: Workbook by Earlie Counts). 2. Assign the client homework each session in which he/she practices relaxation exercises daily, gradually applying them progressively from non-anxiety-provoking to anxiety-provoking situations; review and reinforce success while providing corrective feedback toward improvement. 3. Teach the client calming/relaxation skills (e.g., applied relaxation, progressive muscle relaxation, cue controlled relaxation; mindful breathing; biofeedback) and how to discriminate better between relaxation and tension; teach the client how to apply these skills to his/her daily life. 3. Reduce overall frequency, intensity, and duration of the anxiety so that daily functioning is not impaired. 4. Resolve the core conflict that is the source of anxiety. 5. Stabilize anxiety level while increasing ability to function on a daily basis. Diagnosis :    F43.22  Conditions For Discharge Achievement of treatment goals and  objectives.  Necia Kamm, LCSW

## 2023-12-28 ENCOUNTER — Ambulatory Visit: Payer: 59 | Admitting: Psychology

## 2024-01-28 ENCOUNTER — Ambulatory Visit (INDEPENDENT_AMBULATORY_CARE_PROVIDER_SITE_OTHER): Payer: Self-pay | Admitting: Psychology

## 2024-01-28 DIAGNOSIS — F4322 Adjustment disorder with anxiety: Secondary | ICD-10-CM | POA: Diagnosis not present

## 2024-01-28 NOTE — Progress Notes (Signed)
 April Bridges Counselor Initial Adult Exam  Name: April Bridges Date: 01/28/2024 MRN: [id number] DOB: 1970-12-08 PCP: Napolean Backbone A, DO  Time spent: 9:00am-9:55am   55 minutes  Guardian/Payee:  n/a    Paperwork requested: No   Reason for Visit /Presenting Problem: Pt present for face-to-face initial assessment update via video.  Pt consents to telehealth session and is aware of limitations and benefits of virtual sessions.   Location of pt: home Location of therapist: home office.  Pt continues to struggle with anxiety at times.   She also has challenging family dynamics.  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  Pt's elderly parents are going to move next door to pt this summer which is stressful.  Pt's mother is experiencing cognitive decline.   Addressed pt's concerns about her family and worked on self care strategies.  Reviewed pt's treatment plan for annual update.  Updated pt's treatment plan and IA.  Pt participated in setting treatment goals.  Pt wants to continue to have a safe place to talk and to improve coping skills.   Plan to continue to meet monthly.    Mental Status Exam: Appearance:   Casual     Behavior:  Appropriate  Motor:  Normal  Speech/Language:   Normal Rate  Affect:  Appropriate  Mood:  normal  Thought process:  normal  Thought content:    WNL  Sensory/Perceptual disturbances:    WNL  Orientation:  oriented to person, place, time/date, and situation  Attention:  Good  Concentration:  Good  Memory:  WNL  Fund of knowledge:   Good  Insight:    Good  Judgment:   Good  Impulse Control:  Good    Reported Symptoms:  stress  Risk Assessment: Danger to Self:  No Self-injurious Behavior: No Danger to Others: No Duty to Warn:no Physical Aggression / Violence:No  Access to Firearms a concern: No  Gang Involvement:No  Patient / guardian was educated about steps to take if suicide or homicide risk level increases between visits:  n/a While future psychiatric events cannot be accurately predicted, the patient does not currently require acute inpatient psychiatric care and does not currently meet [facility]  involuntary commitment criteria.  Substance Abuse History: Current substance abuse: No     Past Psychiatric History:   Previous psychological history is significant for anxiety Outpatient Providers:pt has had therapy in the past. History of Psych Hospitalization: No  Psychological Testing: n/a   Abuse History:  Victim of: No., n/a   Report needed: No. Victim of Neglect:No. Perpetrator of n/a  Witness / Exposure to Domestic Violence: No   Protective Services Involvement: No  Witness to [employer] Violence:  No   Family History:  Family History  Problem Relation Age of Onset   Hypertension Mother    Diabetes Mother        2, diet controlled   Breast cancer Mother    Thyroid  disease Father        Hashimoto's   Hypertension Sister    Depression Sister        related to cycle   Other Sister        gluten intolerate or celiac, rectocele repair x 2 with bowel adhesions.   Stroke Sister 69       s/p infections and surgeries   Depression Sister    Fibromyalgia Sister    Thyroid  disease Sister    Cholelithiasis Sister    Muscular dystrophy Son  Haidee Lev   Other Son        Neurogenic bladder   Asthma Son        mild   Celiac disease Son    Celiac disease Son    Diabetes Maternal Uncle    Stroke Maternal Grandmother    Heart disease Maternal Grandfather    Heart attack Maternal Grandfather        ?   Diabetes Paternal Grandmother        type 2   Hypertension Paternal Grandmother    Obesity Paternal Grandmother    Emphysema Paternal Grandfather        smoker    Living situation: the patient lives with husband and 3 sons.  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  She has a 48 yo son with muscular dystrophy .   Pt and husband have three boys ages 64, 63, and 50.   The two older boys were  diagnosed with celiac disease.     Pt grew up with parents and 3 siblings.  Pt is the 3rd in birth order.  Pt feels in general her upbringing was good.   Pt feels close with family.    Sexual Orientation: Straight  Relationship Status: married  Name of spouse / other:Married 30 years.  Pt states they are very close.  Husband is a support.  Husband is an Acupuncturist.  If a parent, number of children / ages:pt has 3 sons.  Support Systems: spouse friends  Surveyor, quantity Stress:  No   Income/Employment/Disability: Employment  Financial planner: No   Educational History: Education: Risk manager: Protestant  Any cultural differences that may affect / interfere with treatment:  not applicable   Recreation/Hobbies: spending time with family. reading  Stressors: Other: concerns about son's physical challenges,  anxiety, family stress.    Strengths: Supportive Relationships, Family, Spirituality, Hopefulness, Self Advocate, and Able to Communicate Effectively  Barriers:  none   Legal History: Pending legal issue / charges: The patient has no significant history of legal issues. History of legal issue / charges: n/a  Medical History/Surgical History: reviewed Past Medical History:  Diagnosis Date   Allergies    Anxiety state 07/01/2014   Asthma    cats, dust, triggers   Atypical moles 03/03/2012   Breast lump on right side at 10 o'clock position 03/21/2012   Chicken pox as a child   Gestational thrombocytopenia (HCC) 02/29/2012   History of recurrent UTIs 07/28/2016   Hx of blood clots    in leg   Hyperglycemia 02/29/2012   Hyperlipidemia 07/01/2013   Lateral epicondylitis of left elbow 04/12/2019   Migraine 06/26/2013   Mumps as a child   Neck pain 07/01/2014    Past Surgical History:  Procedure Laterality Date   CESAREAN SECTION  1999, 2001,2003   X 3   HAMMER TOE SURGERY  1992   right 5th toe   TUBAL LIGATION       Medications: Current Outpatient Medications  Medication Sig Dispense Refill   albuterol  (PROVENTIL  HFA;VENTOLIN  HFA) 108 (90 Base) MCG/ACT inhaler Inhale 2 puffs into the lungs every 6 (six) hours as needed for wheezing. 1 Inhaler 1   cephALEXin  (KEFLEX ) 500 MG capsule Take 1 capsule after intercourse. 30 capsule 2   fluticasone  furoate-vilanterol (BREO ELLIPTA) 200-25 MCG/INH AEPB Inhale into the lungs.     progesterone (PROMETRIUM) 100 MG capsule Take 100 mg by mouth daily.     No current facility-administered medications for this visit.  Allergies  Allergen Reactions   Molds & Smuts     COUGH, TIGHTNESS IN CHEST   Other     CAT DANDER= tightness in chest, watery eyes, cough    Diagnoses:  F43.22  Plan of Care: Recommend ongoing therapy.  Pt participated in setting treatment goals.  Plan to continue to meet monthly.  Pt agrees with treatment plan.    Treatment Plan (Treatment Plan Target Date: 01/27/2025) Client Abilities/Strengths  Pt is bright, engaging and motivated for therapy.  Client Treatment Preferences  Individual therapy.  Client Statement of Needs  Improve coping skills.  Symptoms  Autonomic hyperactivity (e.g., palpitations, shortness of breath, dry mouth, trouble swallowing, nausea, diarrhea). Excessive and/or unrealistic worry that is difficult to control occurring more days than not for at least 6 months about a number of events or activities. Hypervigilance (e.g., feeling constantly on edge, experiencing concentration difficulties, having trouble falling or staying asleep, exhibiting a general state of irritability). Motor tension (e.g., restlessness, tiredness, shakiness, muscle tension). Problems Addressed  Anxiety Goals 1. Enhance ability to effectively cope with the full variety of life's worries and anxieties. 2. Learn and implement coping skills that result in a reduction of anxiety and worry, and improved daily functioning. Objective Learn to  accept limitations in life and commit to tolerating, rather than avoiding, unpleasant emotions while accomplishing meaningful goals. Target Date: 2025-01-27 Frequency: Monthly Progress: 60 Modality: individual Related Interventions 1. Use techniques from Acceptance and Commitment Therapy to help client accept uncomfortable realities such as lack of complete control, imperfections, and uncertainty and tolerate unpleasant emotions and thoughts in order to accomplish value-consistent goals. Objective Learn and implement problem-solving strategies for realistically addressing worries. Target Date: 2025-01-27 Frequency: Monthly Progress: 60 Modality: individual Related Interventions 1. Assign the client a homework exercise in which he/she problem-solves a current problem.  review, reinforce success, and provide corrective feedback toward improvement. 2. Teach the client problem-solving strategies involving specifically defining a problem, generating options for addressing it, evaluating the pros and cons of each option, selecting and implementing an optional action, and reevaluating and refining the action. Objective Learn and implement calming skills to reduce overall anxiety and manage anxiety symptoms. Target Date: 2025-01-27 Frequency: Monthly Progress: 60 Modality: individual Related Interventions 1. Assign the client to read about progressive muscle relaxation and other calming strategies in relevant books or treatment manuals (e.g., Progressive Relaxation Training by Rodolfo Clan and Arvil Birks; Mastery of Your Anxiety and Worry: Workbook by Rodney Clamp). 2. Assign the client homework each session in which he/she practices relaxation exercises daily, gradually applying them progressively from non-anxiety-provoking to anxiety-provoking situations; review and reinforce success while providing corrective feedback toward improvement. 3. Teach the client calming/relaxation skills (e.g., applied  relaxation, progressive muscle relaxation, cue controlled relaxation; mindful breathing; biofeedback) and how to discriminate better between relaxation and tension; teach the client how to apply these skills to his/her daily life. 3. Reduce overall frequency, intensity, and duration of the anxiety so that daily functioning is not impaired. 4. Resolve the core conflict that is the source of anxiety. 5. Stabilize anxiety level while increasing ability to function on a daily basis. Diagnosis :    F43.22  Conditions For Discharge Achievement of treatment goals and objectives.    Sachit Gilman, LCSW

## 2024-03-02 ENCOUNTER — Ambulatory Visit: Admitting: Psychology

## 2024-03-02 DIAGNOSIS — F4322 Adjustment disorder with anxiety: Secondary | ICD-10-CM

## 2024-03-02 NOTE — Progress Notes (Signed)
 [facility] Counselor/Therapist Progress Note  [name], MRN: [id number],    Date: 03/02/2024  Time Spent: 11:00am-11:55am   55 minutes   Treatment Type: Individual Therapy  Reported Symptoms: stress, anxiety  Mental Status Exam: Appearance:  Casual     Behavior: Appropriate  Motor: Normal  Speech/Language:  Normal Rate  Affect: Appropriate  Mood: normal  Thought process: normal  Thought content:   WNL  Sensory/Perceptual disturbances:   WNL  Orientation: oriented to person, place, time/date, and situation  Attention: Good  Concentration: Good  Memory: WNL  Fund of knowledge:  Good  Insight:   Good  Judgment:  Good  Impulse Control: Good   Risk Assessment: Danger to Self:  No Self-injurious Behavior: No Danger to Others: No Duty to Warn:no Physical Aggression / Violence:No  Access to Firearms a concern: No  Gang Involvement:No   Subjective: Pt present for face-to-face individual therapy via video.  Pt consents to telehealth video session and is aware of limitations and benefits of virtual sessions. Location of pt: home Location of therapist: home office.   Pt states she has been very busy and things have been stressful.   She has had to help her son Angus Bark a lot with academic things.   She is also preparing for her parents to move into the house next door sometime this summer.  Pt expects herself to have things be perfect.   Addressed pt's expectations and how she can release ones that are too lofty.   Pt states her husband needs double hip replacements and her son Angus Bark may have crohn's disease.  Pt feels overwhelmed by these medical needs and the care giving they will involve.   Worked on self care strategies. Provided supportive therapy.   Interventions: Cognitive Behavioral Therapy and Insight-Oriented  Diagnosis: F43.22   Plan of Care: Recommend ongoing therapy.  Pt participated in setting treatment goals.  Plan to continue to  meet monthly.  Pt agrees with treatment plan.    Treatment Plan (Treatment Plan Target Date: 01/27/2025) Client Abilities/Strengths  Pt is bright, engaging and motivated for therapy.  Client Treatment Preferences  Individual therapy.  Client Statement of Needs  Improve coping skills.  Symptoms  Autonomic hyperactivity (e.g., palpitations, shortness of breath, dry mouth, trouble swallowing, nausea, diarrhea). Excessive and/or unrealistic worry that is difficult to control occurring more days than not for at least 6 months about a number of events or activities. Hypervigilance (e.g., feeling constantly on edge, experiencing concentration difficulties, having trouble falling or staying asleep, exhibiting a general state of irritability). Motor tension (e.g., restlessness, tiredness, shakiness, muscle tension). Problems Addressed  Anxiety Goals 1. Enhance ability to effectively cope with the full variety of life's worries and anxieties. 2. Learn and implement coping skills that result in a reduction of anxiety and worry, and improved daily functioning. Objective Learn to accept limitations in life and commit to tolerating, rather than avoiding, unpleasant emotions while accomplishing meaningful goals. Target Date: 2025-01-27 Frequency: Monthly Progress: 60 Modality: individual Related Interventions 1. Use techniques from Acceptance and Commitment Therapy to help client accept uncomfortable realities such as lack of complete control, imperfections, and uncertainty and tolerate unpleasant emotions and thoughts in order to accomplish value-consistent goals. Objective Learn and implement problem-solving strategies for realistically addressing worries. Target Date: 2025-01-27 Frequency: Monthly Progress: 60 Modality: individual Related Interventions 1. Assign the client a homework exercise in which he/she problem-solves a current problem.  review, reinforce success, and provide corrective feedback  toward  improvement. 2. Teach the client problem-solving strategies involving specifically defining a problem, generating options for addressing it, evaluating the pros and cons of each option, selecting and implementing an optional action, and reevaluating and refining the action. Objective Learn and implement calming skills to reduce overall anxiety and manage anxiety symptoms. Target Date: 2025-01-27 Frequency: Monthly Progress: 60 Modality: individual Related Interventions 1. Assign the client to read about progressive muscle relaxation and other calming strategies in relevant books or treatment manuals (e.g., Progressive Relaxation Training by Rodolfo Clan and Arvil Birks; Mastery of Your Anxiety and Worry: Workbook by Rodney Clamp). 2. Assign the client homework each session in which he/she practices relaxation exercises daily, gradually applying them progressively from non-anxiety-provoking to anxiety-provoking situations; review and reinforce success while providing corrective feedback toward improvement. 3. Teach the client calming/relaxation skills (e.g., applied relaxation, progressive muscle relaxation, cue controlled relaxation; mindful breathing; biofeedback) and how to discriminate better between relaxation and tension; teach the client how to apply these skills to his/her daily life. 3. Reduce overall frequency, intensity, and duration of the anxiety so that daily functioning is not impaired. 4. Resolve the core conflict that is the source of anxiety. 5. Stabilize anxiety level while increasing ability to function on a daily basis. Diagnosis :    F43.22  Conditions For Discharge Achievement of treatment goals and objectives.  Smokey Melott, LCSW

## 2024-04-11 ENCOUNTER — Ambulatory Visit (INDEPENDENT_AMBULATORY_CARE_PROVIDER_SITE_OTHER): Admitting: Psychology

## 2024-04-11 DIAGNOSIS — F4322 Adjustment disorder with anxiety: Secondary | ICD-10-CM

## 2024-04-11 NOTE — Progress Notes (Signed)
 [facility] Counselor/Therapist Progress Note  [name], MRN: [id number],    Date: 04/11/2024  Time Spent: 9:00am-9:55am   55 minutes   Treatment Type: Individual Therapy  Reported Symptoms: stress, anxiety  Mental Status Exam: Appearance:  Casual     Behavior: Appropriate  Motor: Normal  Speech/Language:  Normal Rate  Affect: Appropriate  Mood: normal  Thought process: normal  Thought content:   WNL  Sensory/Perceptual disturbances:   WNL  Orientation: oriented to person, place, time/date, and situation  Attention: Good  Concentration: Good  Memory: WNL  Fund of knowledge:  Good  Insight:   Good  Judgment:  Good  Impulse Control: Good   Risk Assessment: Danger to Self:  No Self-injurious Behavior: No Danger to Others: No Duty to Warn:no Physical Aggression / Violence:No  Access to Firearms a concern: No  Gang Involvement:No   Subjective: Pt present for face-to-face individual therapy via video.  Pt consents to telehealth video session and is aware of limitations and benefits of virtual sessions. Location of pt: home Location of therapist: home office.   Pt talked about her family.   Her 3rd son is going away to college in the fall and pt is grieving.  Pt's 2nd son got in a motorcycle accident that was scary but he is ok.   Pt's oldest son Thedora is getting ready for returning to [facility] in the Fall and pt will accompany him. Pt had a visit with her parents and sisters and there were conversations that were triggering for pt.   Pt's negative self talk got triggered.  Addressed the interactions and how they impacted pt.   Pt knows her family loves her but she does not feel like she can be herself around them.  Pt has a fear of judgement and feels like she does not fit in.  Helped pt process her feelings and family dynamics.   Worked on self care strategies Provided supportive therapy.   Interventions: Cognitive Behavioral Therapy and  Insight-Oriented  Diagnosis: F43.22   Plan of Care: Recommend ongoing therapy.  Pt participated in setting treatment goals.  Plan to continue to meet monthly.  Pt agrees with treatment plan.    Treatment Plan (Treatment Plan Target Date: 01/27/2025) Client Abilities/Strengths  Pt is bright, engaging and motivated for therapy.  Client Treatment Preferences  Individual therapy.  Client Statement of Needs  Improve coping skills.  Symptoms  Autonomic hyperactivity (e.g., palpitations, shortness of breath, dry mouth, trouble swallowing, nausea, diarrhea). Excessive and/or unrealistic worry that is difficult to control occurring more days than not for at least 6 months about a number of events or activities. Hypervigilance (e.g., feeling constantly on edge, experiencing concentration difficulties, having trouble falling or staying asleep, exhibiting a general state of irritability). Motor tension (e.g., restlessness, tiredness, shakiness, muscle tension). Problems Addressed  Anxiety Goals 1. Enhance ability to effectively cope with the full variety of life's worries and anxieties. 2. Learn and implement coping skills that result in a reduction of anxiety and worry, and improved daily functioning. Objective Learn to accept limitations in life and commit to tolerating, rather than avoiding, unpleasant emotions while accomplishing meaningful goals. Target Date: 2025-01-27 Frequency: Monthly Progress: 60 Modality: individual Related Interventions 1. Use techniques from Acceptance and Commitment Therapy to help client accept uncomfortable realities such as lack of complete control, imperfections, and uncertainty and tolerate unpleasant emotions and thoughts in order to accomplish value-consistent goals. Objective Learn and implement problem-solving strategies for realistically addressing  worries. Target Date: 2025-01-27 Frequency: Monthly Progress: 60 Modality: individual Related  Interventions 1. Assign the client a homework exercise in which he/she problem-solves a current problem.  review, reinforce success, and provide corrective feedback toward improvement. 2. Teach the client problem-solving strategies involving specifically defining a problem, generating options for addressing it, evaluating the pros and cons of each option, selecting and implementing an optional action, and reevaluating and refining the action. Objective Learn and implement calming skills to reduce overall anxiety and manage anxiety symptoms. Target Date: 2025-01-27 Frequency: Monthly Progress: 60 Modality: individual Related Interventions 1. Assign the client to read about progressive muscle relaxation and other calming strategies in relevant books or treatment manuals (e.g., Progressive Relaxation Training by Thornell and Elmer; Mastery of Your Anxiety and Worry: Workbook by Richarda armin Given). 2. Assign the client homework each session in which he/she practices relaxation exercises daily, gradually applying them progressively from non-anxiety-provoking to anxiety-provoking situations; review and reinforce success while providing corrective feedback toward improvement. 3. Teach the client calming/relaxation skills (e.g., applied relaxation, progressive muscle relaxation, cue controlled relaxation; mindful breathing; biofeedback) and how to discriminate better between relaxation and tension; teach the client how to apply these skills to his/her daily life. 3. Reduce overall frequency, intensity, and duration of the anxiety so that daily functioning is not impaired. 4. Resolve the core conflict that is the source of anxiety. 5. Stabilize anxiety level while increasing ability to function on a daily basis. Diagnosis :    F43.22  Conditions For Discharge Achievement of treatment goals and objectives.  Elza Sortor, LCSW

## 2024-05-11 ENCOUNTER — Ambulatory Visit (INDEPENDENT_AMBULATORY_CARE_PROVIDER_SITE_OTHER): Admitting: Psychology

## 2024-05-11 DIAGNOSIS — F4322 Adjustment disorder with anxiety: Secondary | ICD-10-CM | POA: Diagnosis not present

## 2024-05-11 NOTE — Progress Notes (Signed)
 [facility] Counselor/Therapist Progress Note  Patient ID: Nautica Hotz, MRN: [id number],    Date: 05/11/2024  Time Spent: 9:00am-9:55am   55 minutes   Treatment Type: Individual Therapy  Reported Symptoms: stress, anxiety  Mental Status Exam: Appearance:  Casual     Behavior: Appropriate  Motor: Normal  Speech/Language:  Normal Rate  Affect: Appropriate  Mood: normal  Thought process: normal  Thought content:   WNL  Sensory/Perceptual disturbances:   WNL  Orientation: oriented to person, place, time/date, and situation  Attention: Good  Concentration: Good  Memory: WNL  Fund of knowledge:  Good  Insight:   Good  Judgment:  Good  Impulse Control: Good   Risk Assessment: Danger to Self:  No Self-injurious Behavior: No Danger to Others: No Duty to Warn:no Physical Aggression / Violence:No  Access to Firearms a concern: No  Gang Involvement:No   Subjective: Pt present for face-to-face individual therapy via video.  Pt consents to telehealth video session and is aware of limitations and benefits of virtual sessions. Location of pt: home Location of therapist: home office.   Pt talked about her family.   Her son Thedora has started another semester at [employer] and pt is there to help him since he has muscular dystrophy.   Pt feels exhausted with all she has to manage.   [name]'s energy level is worse bc he has sleep apnea also now and pt is not sure he will be able to keep up with graduate school. Addressed pt's concerns about Thedora.   Pt states she is struggling with issues with her friend Annabella.   [name] is in therapy now which is helpful for her and pt.  Pt still has to provide a lot of support to [name].  Pt worries about her and gets caught up in [name]'s issues.   Helped pt process the relationship dynamics. Worked on healthy boundary setting. Worked on self care strategies. Provided supportive therapy.   Interventions: Cognitive Behavioral Therapy and  Insight-Oriented  Diagnosis: F43.22   Plan of Care: Recommend ongoing therapy.  Pt participated in setting treatment goals.  Plan to continue to meet monthly.  Pt agrees with treatment plan.    Treatment Plan (Treatment Plan Target Date: 01/27/2025) Client Abilities/Strengths  Pt is bright, engaging and motivated for therapy.  Client Treatment Preferences  Individual therapy.  Client Statement of Needs  Improve coping skills.  Symptoms  Autonomic hyperactivity (e.g., palpitations, shortness of breath, dry mouth, trouble swallowing, nausea, diarrhea). Excessive and/or unrealistic worry that is difficult to control occurring more days than not for at least 6 months about a number of events or activities. Hypervigilance (e.g., feeling constantly on edge, experiencing concentration difficulties, having trouble falling or staying asleep, exhibiting a general state of irritability). Motor tension (e.g., restlessness, tiredness, shakiness, muscle tension). Problems Addressed  Anxiety Goals 1. Enhance ability to effectively cope with the full variety of life's worries and anxieties. 2. Learn and implement coping skills that result in a reduction of anxiety and worry, and improved daily functioning. Objective Learn to accept limitations in life and commit to tolerating, rather than avoiding, unpleasant emotions while accomplishing meaningful goals. Target Date: 2025-01-27 Frequency: Monthly Progress: 60 Modality: individual Related Interventions 1. Use techniques from Acceptance and Commitment Therapy to help client accept uncomfortable realities such as lack of complete control, imperfections, and uncertainty and tolerate unpleasant emotions and thoughts in order to accomplish value-consistent goals. Objective Learn and implement problem-solving strategies for realistically addressing worries. Target Date:  2025-01-27 Frequency: Monthly Progress: 60 Modality: individual Related  Interventions 1. Assign the client a homework exercise in which he/she problem-solves a current problem.  review, reinforce success, and provide corrective feedback toward improvement. 2. Teach the client problem-solving strategies involving specifically defining a problem, generating options for addressing it, evaluating the pros and cons of each option, selecting and implementing an optional action, and reevaluating and refining the action. Objective Learn and implement calming skills to reduce overall anxiety and manage anxiety symptoms. Target Date: 2025-01-27 Frequency: Monthly Progress: 60 Modality: individual Related Interventions 1. Assign the client to read about progressive muscle relaxation and other calming strategies in relevant books or treatment manuals (e.g., Progressive Relaxation Training by Thornell and Elmer; Mastery of Your Anxiety and Worry: Workbook by Richarda armin Given). 2. Assign the client homework each session in which he/she practices relaxation exercises daily, gradually applying them progressively from non-anxiety-provoking to anxiety-provoking situations; review and reinforce success while providing corrective feedback toward improvement. 3. Teach the client calming/relaxation skills (e.g., applied relaxation, progressive muscle relaxation, cue controlled relaxation; mindful breathing; biofeedback) and how to discriminate better between relaxation and tension; teach the client how to apply these skills to his/her daily life. 3. Reduce overall frequency, intensity, and duration of the anxiety so that daily functioning is not impaired. 4. Resolve the core conflict that is the source of anxiety. 5. Stabilize anxiety level while increasing ability to function on a daily basis. Diagnosis :    F43.22  Conditions For Discharge Achievement of treatment goals and objectives.  Kadan Millstein, LCSW

## 2024-06-08 ENCOUNTER — Ambulatory Visit: Admitting: Psychology

## 2024-06-08 DIAGNOSIS — F4322 Adjustment disorder with anxiety: Secondary | ICD-10-CM | POA: Diagnosis not present

## 2024-06-08 NOTE — Progress Notes (Signed)
 [facility] Counselor/Therapist Progress Note  [name], MRN: [id number],    Date: 06/08/2024  Time Spent: 9:00am-9:55am   55 minutes   Treatment Type: Individual Therapy  Reported Symptoms: stress, anxiety  Mental Status Exam: Appearance:  Casual     Behavior: Appropriate  Motor: Normal  Speech/Language:  Normal Rate  Affect: Appropriate  Mood: normal  Thought process: normal  Thought content:   WNL  Sensory/Perceptual disturbances:   WNL  Orientation: oriented to person, place, time/date, and situation  Attention: Good  Concentration: Good  Memory: WNL  Fund of knowledge:  Good  Insight:   Good  Judgment:  Good  Impulse Control: Good   Risk Assessment: Danger to Self:  No Self-injurious Behavior: No Danger to Others: No Duty to Warn:no Physical Aggression / Violence:No  Access to Firearms a concern: No  Gang Involvement:No   Subjective: Pt present for face-to-face individual therapy via video.  Pt consents to telehealth video session and is aware of limitations and benefits of virtual sessions. Location of pt: home Location of therapist: home office.   Pt talked about being sick the past week.   She thinks so has covid.  She still has to keep care giving for her son April Bridges so pt is feeling very exhausted. Pt talked about her family.   Her son April Bridges is away at college and is feeling the stress of a full academic load.  Pt is worried about him and started to feel anxious.  She needed to talk it out and when she talked with one of her sisters she was not helpful and this upset pt.  Pt is taking on the stress of her son.  Worked on how she can release this and support her son but have healthy emotional boundaries. Pt talked about her parents' house being on the market which means their move to live near pt is going to be soon.  Pt does not feel ready and worries about how her parents will adjust to the move.  Addressed pt's worries and  worked on worry management.  Worked on self care strategies. Provided supportive therapy.   Interventions: Cognitive Behavioral Therapy and Insight-Oriented  Diagnosis: F43.22   Plan of Care: Recommend ongoing therapy.  Pt participated in setting treatment goals.  Plan to continue to meet monthly.  Pt agrees with treatment plan.    Treatment Plan (Treatment Plan Target Date: 01/27/2025) Client Abilities/Strengths  Pt is bright, engaging and motivated for therapy.  Client Treatment Preferences  Individual therapy.  Client Statement of Needs  Improve coping skills.  Symptoms  Autonomic hyperactivity (e.g., palpitations, shortness of breath, dry mouth, trouble swallowing, nausea, diarrhea). Excessive and/or unrealistic worry that is difficult to control occurring more days than not for at least 6 months about a number of events or activities. Hypervigilance (e.g., feeling constantly on edge, experiencing concentration difficulties, having trouble falling or staying asleep, exhibiting a general state of irritability). Motor tension (e.g., restlessness, tiredness, shakiness, muscle tension). Problems Addressed  Anxiety Goals 1. Enhance ability to effectively cope with the full variety of life's worries and anxieties. 2. Learn and implement coping skills that result in a reduction of anxiety and worry, and improved daily functioning. Objective Learn to accept limitations in life and commit to tolerating, rather than avoiding, unpleasant emotions while accomplishing meaningful goals. Target Date: 2025-01-27 Frequency: Monthly Progress: 60 Modality: individual Related Interventions 1. Use techniques from Acceptance and Commitment Therapy to help client accept uncomfortable realities such as  lack of complete control, imperfections, and uncertainty and tolerate unpleasant emotions and thoughts in order to accomplish value-consistent goals. Objective Learn and implement problem-solving strategies  for realistically addressing worries. Target Date: 2025-01-27 Frequency: Monthly Progress: 60 Modality: individual Related Interventions 1. Assign the client a homework exercise in which he/she problem-solves a current problem.  review, reinforce success, and provide corrective feedback toward improvement. 2. Teach the client problem-solving strategies involving specifically defining a problem, generating options for addressing it, evaluating the pros and cons of each option, selecting and implementing an optional action, and reevaluating and refining the action. Objective Learn and implement calming skills to reduce overall anxiety and manage anxiety symptoms. Target Date: 2025-01-27 Frequency: Monthly Progress: 60 Modality: individual Related Interventions 1. Assign the client to read about progressive muscle relaxation and other calming strategies in relevant books or treatment manuals (e.g., Progressive Relaxation Training by Thornell and Elmer; Mastery of Your Anxiety and Worry: Workbook by Richarda armin Given). 2. Assign the client homework each session in which he/she practices relaxation exercises daily, gradually applying them progressively from non-anxiety-provoking to anxiety-provoking situations; review and reinforce success while providing corrective feedback toward improvement. 3. Teach the client calming/relaxation skills (e.g., applied relaxation, progressive muscle relaxation, cue controlled relaxation; mindful breathing; biofeedback) and how to discriminate better between relaxation and tension; teach the client how to apply these skills to his/her daily life. 3. Reduce overall frequency, intensity, and duration of the anxiety so that daily functioning is not impaired. 4. Resolve the core conflict that is the source of anxiety. 5. Stabilize anxiety level while increasing ability to function on a daily basis. Diagnosis :    F43.22  Conditions For Discharge Achievement of  treatment goals and objectives.  April Gelinas, LCSW

## 2024-07-06 ENCOUNTER — Ambulatory Visit: Admitting: Psychology

## 2024-07-06 DIAGNOSIS — F4322 Adjustment disorder with anxiety: Secondary | ICD-10-CM

## 2024-07-06 NOTE — Progress Notes (Signed)
 [facility] Counselor/Therapist Progress Note  [name], MRN: [id number],    Date: 07/06/2024  Time Spent: 9:00am-9:55am   55 minutes   Treatment Type: Individual Therapy  Reported Symptoms: stress, anxiety  Mental Status Exam: Appearance:  Casual     Behavior: Appropriate  Motor: Normal  Speech/Language:  Normal Rate  Affect: Appropriate  Mood: normal  Thought process: normal  Thought content:   WNL  Sensory/Perceptual disturbances:   WNL  Orientation: oriented to person, place, time/date, and situation  Attention: Good  Concentration: Good  Memory: WNL  Fund of knowledge:  Good  Insight:   Good  Judgment:  Good  Impulse Control: Good   Risk Assessment: Danger to Self:  No Self-injurious Behavior: No Danger to Others: No Duty to Warn:no Physical Aggression / Violence:No  Access to Firearms a concern: No  Gang Involvement:No   Subjective: Pt present for face-to-face individual therapy via video.  Pt consents to telehealth video session and is aware of limitations and benefits of virtual sessions. Location of pt: home Location of therapist: home office.   Pt states she feels like her life is a pressure cooker and she needs to release the valve.   Addressed the source of pt's stress. Pt worries about her family.   Her son April Bridges is away at college and is feeling the stress of a full academic load.   Pt's son April Bridges is exhausted with his physical issues and academic demands.   Pt worries about him as well and wonders if [name]'s body just can't handle the stress anymore.  Pt is taking on the stress of her sons.  Worked on how she can release this and support her son but have healthy emotional boundaries. Pt talked about her parents' house being on the market which means their move to live near pt is going to be soon.  Pt does not feel ready and worries about how her parents will adjust to the move.  Addressed pt's worries and worked on worry  management.  Worked on self care strategies. Provided supportive therapy.   Interventions: Cognitive Behavioral Therapy and Insight-Oriented  Diagnosis: F43.22   Plan of Care: Recommend ongoing therapy.  Pt participated in setting treatment goals.  Plan to continue to meet monthly.  Pt agrees with treatment plan.    Treatment Plan (Treatment Plan Target Date: 01/27/2025) Client Abilities/Strengths  Pt is bright, engaging and motivated for therapy.  Client Treatment Preferences  Individual therapy.  Client Statement of Needs  Improve coping skills.  Symptoms  Autonomic hyperactivity (e.g., palpitations, shortness of breath, dry mouth, trouble swallowing, nausea, diarrhea). Excessive and/or unrealistic worry that is difficult to control occurring more days than not for at least 6 months about a number of events or activities. Hypervigilance (e.g., feeling constantly on edge, experiencing concentration difficulties, having trouble falling or staying asleep, exhibiting a general state of irritability). Motor tension (e.g., restlessness, tiredness, shakiness, muscle tension). Problems Addressed  Anxiety Goals 1. Enhance ability to effectively cope with the full variety of life's worries and anxieties. 2. Learn and implement coping skills that result in a reduction of anxiety and worry, and improved daily functioning. Objective Learn to accept limitations in life and commit to tolerating, rather than avoiding, unpleasant emotions while accomplishing meaningful goals. Target Date: 2025-01-27 Frequency: Monthly Progress: 60 Modality: individual Related Interventions 1. Use techniques from Acceptance and Commitment Therapy to help client accept uncomfortable realities such as lack of complete control, imperfections, and uncertainty and tolerate  unpleasant emotions and thoughts in order to accomplish value-consistent goals. Objective Learn and implement problem-solving strategies for  realistically addressing worries. Target Date: 2025-01-27 Frequency: Monthly Progress: 60 Modality: individual Related Interventions 1. Assign the client a homework exercise in which he/she problem-solves a current problem.  review, reinforce success, and provide corrective feedback toward improvement. 2. Teach the client problem-solving strategies involving specifically defining a problem, generating options for addressing it, evaluating the pros and cons of each option, selecting and implementing an optional action, and reevaluating and refining the action. Objective Learn and implement calming skills to reduce overall anxiety and manage anxiety symptoms. Target Date: 2025-01-27 Frequency: Monthly Progress: 60 Modality: individual Related Interventions 1. Assign the client to read about progressive muscle relaxation and other calming strategies in relevant books or treatment manuals (e.g., Progressive Relaxation Training by Thornell and Elmer; Mastery of Your Anxiety and Worry: Workbook by Richarda armin Given). 2. Assign the client homework each session in which he/she practices relaxation exercises daily, gradually applying them progressively from non-anxiety-provoking to anxiety-provoking situations; review and reinforce success while providing corrective feedback toward improvement. 3. Teach the client calming/relaxation skills (e.g., applied relaxation, progressive muscle relaxation, cue controlled relaxation; mindful breathing; biofeedback) and how to discriminate better between relaxation and tension; teach the client how to apply these skills to his/her daily life. 3. Reduce overall frequency, intensity, and duration of the anxiety so that daily functioning is not impaired. 4. Resolve the core conflict that is the source of anxiety. 5. Stabilize anxiety level while increasing ability to function on a daily basis. Diagnosis :    F43.22  Conditions For Discharge Achievement of treatment  goals and objectives.  Ramonita Koenig, LCSW

## 2024-08-08 ENCOUNTER — Ambulatory Visit: Admitting: Psychology

## 2024-08-08 DIAGNOSIS — F4322 Adjustment disorder with anxiety: Secondary | ICD-10-CM | POA: Diagnosis not present

## 2024-08-08 NOTE — Progress Notes (Signed)
  [facility] Counselor/Therapist Progress Note  [name], MRN: [id number],    Date: 08/08/2024  Time Spent: 9:00am-9:55am   55 minutes   Treatment Type: Individual Therapy  Reported Symptoms: stress, anxiety  Mental Status Exam: Appearance:  Casual     Behavior: Appropriate  Motor: Normal  Speech/Language:  Normal Rate  Affect: Appropriate  Mood: normal  Thought process: normal  Thought content:   WNL  Sensory/Perceptual disturbances:   WNL  Orientation: oriented to person, place, time/date, and situation  Attention: Good  Concentration: Good  Memory: WNL  Fund of knowledge:  Good  Insight:   Good  Judgment:  Good  Impulse Control: Good   Risk Assessment: Danger to Self:  No Self-injurious Behavior: No Danger to Others: No Duty to Warn:no Physical Aggression / Violence:No  Access to Firearms a concern: No  Gang Involvement:No   Subjective: Pt present for face-to-face individual therapy via video.  Pt consents to telehealth video session and is aware of limitations and benefits of virtual sessions. Location of pt: home Location of therapist: home office.   Pt talked about the stress of being at [employer] with her son Thedora.   This semester has been a lot of work.   Pt does not know if [name]'s body can continue to handle the work of a phd program.   Pt is taking on the stress of her sons.  Worked on how she can release this and support her son but have healthy emotional boundaries. Pt feels like her friends and family do not understand her and what she is experiencing.  This is resulting in pt distancing from them and feeling less patient with them.   Helped pt process her feelings.   Addressed how pt can communicate her needs to her family and friends.   Pt talked about her parents' house being on the market which means their move to live near pt is going to be soon.  Pt does not feel ready and worries about how her parents will adjust to  the move.  Addressed pt's worries and worked on worry management.   Pt is also worried about how to manage the holidays and feels too tired to host family.  Worked on self care strategies. Provided supportive therapy.   Interventions: Cognitive Behavioral Therapy and Insight-Oriented  Diagnosis: F43.22   Plan of Care: Recommend ongoing therapy.  Pt participated in setting treatment goals.  Plan to continue to meet monthly.  Pt agrees with treatment plan.    Treatment Plan (Treatment Plan Target Date: 01/27/2025) Client Abilities/Strengths  Pt is bright, engaging and motivated for therapy.  Client Treatment Preferences  Individual therapy.  Client Statement of Needs  Improve coping skills.  Symptoms  Autonomic hyperactivity (e.g., palpitations, shortness of breath, dry mouth, trouble swallowing, nausea, diarrhea). Excessive and/or unrealistic worry that is difficult to control occurring more days than not for at least 6 months about a number of events or activities. Hypervigilance (e.g., feeling constantly on edge, experiencing concentration difficulties, having trouble falling or staying asleep, exhibiting a general state of irritability). Motor tension (e.g., restlessness, tiredness, shakiness, muscle tension). Problems Addressed  Anxiety Goals 1. Enhance ability to effectively cope with the full variety of life's worries and anxieties. 2. Learn and implement coping skills that result in a reduction of anxiety and worry, and improved daily functioning. Objective Learn to accept limitations in life and commit to tolerating, rather than avoiding, unpleasant emotions while accomplishing meaningful goals. Target Date:  2025-01-27 Frequency: Monthly Progress: 60 Modality: individual Related Interventions 1. Use techniques from Acceptance and Commitment Therapy to help client accept uncomfortable realities such as lack of complete control, imperfections, and uncertainty and tolerate unpleasant  emotions and thoughts in order to accomplish value-consistent goals. Objective Learn and implement problem-solving strategies for realistically addressing worries. Target Date: 2025-01-27 Frequency: Monthly Progress: 60 Modality: individual Related Interventions 1. Assign the client a homework exercise in which he/she problem-solves a current problem.  review, reinforce success, and provide corrective feedback toward improvement. 2. Teach the client problem-solving strategies involving specifically defining a problem, generating options for addressing it, evaluating the pros and cons of each option, selecting and implementing an optional action, and reevaluating and refining the action. Objective Learn and implement calming skills to reduce overall anxiety and manage anxiety symptoms. Target Date: 2025-01-27 Frequency: Monthly Progress: 60 Modality: individual Related Interventions 1. Assign the client to read about progressive muscle relaxation and other calming strategies in relevant books or treatment manuals (e.g., Progressive Relaxation Training by Thornell and Elmer; Mastery of Your Anxiety and Worry: Workbook by Richarda armin Given). 2. Assign the client homework each session in which he/she practices relaxation exercises daily, gradually applying them progressively from non-anxiety-provoking to anxiety-provoking situations; review and reinforce success while providing corrective feedback toward improvement. 3. Teach the client calming/relaxation skills (e.g., applied relaxation, progressive muscle relaxation, cue controlled relaxation; mindful breathing; biofeedback) and how to discriminate better between relaxation and tension; teach the client how to apply these skills to his/her daily life. 3. Reduce overall frequency, intensity, and duration of the anxiety so that daily functioning is not impaired. 4. Resolve the core conflict that is the source of anxiety. 5. Stabilize anxiety level  while increasing ability to function on a daily basis. Diagnosis :    F43.22  Conditions For Discharge Achievement of treatment goals and objectives.  Fabienne Nolasco, LCSW

## 2024-09-05 ENCOUNTER — Ambulatory Visit: Admitting: Psychology

## 2024-09-05 DIAGNOSIS — F4322 Adjustment disorder with anxiety: Secondary | ICD-10-CM

## 2024-09-05 NOTE — Progress Notes (Signed)
 [facility] Counselor/Therapist Progress Note  [name], MRN: [id number],    Date: 09/05/2024  Time Spent: 9:00am-9:55am   55 minutes   Treatment Type: Individual Therapy  Reported Symptoms: stress, anxiety  Mental Status Exam: Appearance:  Casual     Behavior: Appropriate  Motor: Normal  Speech/Language:  Normal Rate  Affect: Appropriate  Mood: normal  Thought process: normal  Thought content:   WNL  Sensory/Perceptual disturbances:   WNL  Orientation: oriented to person, place, time/date, and situation  Attention: Good  Concentration: Good  Memory: WNL  Fund of knowledge:  Good  Insight:   Good  Judgment:  Good  Impulse Control: Good   Risk Assessment: Danger to Self:  No Self-injurious Behavior: No Danger to Others: No Duty to Warn:no Physical Aggression / Violence:No  Access to Firearms a concern: No  Gang Involvement:No   Subjective: Pt present for face-to-face individual therapy via video.  Pt consents to telehealth video session and is aware of limitations and benefits of virtual sessions. Location of pt: home Location of therapist: home office.   Pt talked about her parents planning to visit tomorrow for the holidays.  Pt is worried about her mother bc of her cognitive decline.   Pt talked about her relationship with her brother.  Pt holds resentment for her brother.   Addressed the relationship dynamics and how they impact pt.   Worked on releasing what she can't control.  Pt is taking on the stress of her sons.  Worked on how she can release this and support her son but have healthy emotional boundaries. Pt talked about her parents' house being on the market which means their move to live near pt is going to be soon.  Pt does not feel ready and worries about how her parents will adjust to the move.  Addressed pt's worries and worked on worry management.  Worked on self care strategies. Provided supportive therapy.    Interventions: Cognitive Behavioral Therapy and Insight-Oriented  Diagnosis: F43.22   Plan of Care: Recommend ongoing therapy.  Pt participated in setting treatment goals.  Plan to continue to meet monthly.  Pt agrees with treatment plan.    Treatment Plan (Treatment Plan Target Date: 01/27/2025) Client Abilities/Strengths  Pt is bright, engaging and motivated for therapy.  Client Treatment Preferences  Individual therapy.  Client Statement of Needs  Improve coping skills.  Symptoms  Autonomic hyperactivity (e.g., palpitations, shortness of breath, dry mouth, trouble swallowing, nausea, diarrhea). Excessive and/or unrealistic worry that is difficult to control occurring more days than not for at least 6 months about a number of events or activities. Hypervigilance (e.g., feeling constantly on edge, experiencing concentration difficulties, having trouble falling or staying asleep, exhibiting a general state of irritability). Motor tension (e.g., restlessness, tiredness, shakiness, muscle tension). Problems Addressed  Anxiety Goals 1. Enhance ability to effectively cope with the full variety of life's worries and anxieties. 2. Learn and implement coping skills that result in a reduction of anxiety and worry, and improved daily functioning. Objective Learn to accept limitations in life and commit to tolerating, rather than avoiding, unpleasant emotions while accomplishing meaningful goals. Target Date: 2025-01-27 Frequency: Monthly Progress: 60 Modality: individual Related Interventions 1. Use techniques from Acceptance and Commitment Therapy to help client accept uncomfortable realities such as lack of complete control, imperfections, and uncertainty and tolerate unpleasant emotions and thoughts in order to accomplish value-consistent goals. Objective Learn and implement problem-solving strategies for realistically addressing worries. Target Date:  2025-01-27 Frequency:  Monthly Progress: 60 Modality: individual Related Interventions 1. Assign the client a homework exercise in which he/she problem-solves a current problem.  review, reinforce success, and provide corrective feedback toward improvement. 2. Teach the client problem-solving strategies involving specifically defining a problem, generating options for addressing it, evaluating the pros and cons of each option, selecting and implementing an optional action, and reevaluating and refining the action. Objective Learn and implement calming skills to reduce overall anxiety and manage anxiety symptoms. Target Date: 2025-01-27 Frequency: Monthly Progress: 60 Modality: individual Related Interventions 1. Assign the client to read about progressive muscle relaxation and other calming strategies in relevant books or treatment manuals (e.g., Progressive Relaxation Training by Thornell and Elmer; Mastery of Your Anxiety and Worry: Workbook by Richarda armin Given). 2. Assign the client homework each session in which he/she practices relaxation exercises daily, gradually applying them progressively from non-anxiety-provoking to anxiety-provoking situations; review and reinforce success while providing corrective feedback toward improvement. 3. Teach the client calming/relaxation skills (e.g., applied relaxation, progressive muscle relaxation, cue controlled relaxation; mindful breathing; biofeedback) and how to discriminate better between relaxation and tension; teach the client how to apply these skills to his/her daily life. 3. Reduce overall frequency, intensity, and duration of the anxiety so that daily functioning is not impaired. 4. Resolve the core conflict that is the source of anxiety. 5. Stabilize anxiety level while increasing ability to function on a daily basis. Diagnosis :    F43.22  Conditions For Discharge Achievement of treatment goals and objectives.  Lyman Balingit, LCSW

## 2024-10-03 ENCOUNTER — Ambulatory Visit: Admitting: Psychology

## 2024-10-03 DIAGNOSIS — F4322 Adjustment disorder with anxiety: Secondary | ICD-10-CM

## 2024-10-03 NOTE — Progress Notes (Signed)
 "  [facility] Counselor/Therapist Progress Note  [name], MRN: [id number],    Date: 10/03/2024  Time Spent: 9:00am-9:55am   55 minutes   Treatment Type: Individual Therapy  Reported Symptoms: stress, anxiety  Mental Status Exam: Appearance:  Casual     Behavior: Appropriate  Motor: Normal  Speech/Language:  Normal Rate  Affect: Appropriate  Mood: normal  Thought process: normal  Thought content:   WNL  Sensory/Perceptual disturbances:   WNL  Orientation: oriented to person, place, time/date, and situation  Attention: Good  Concentration: Good  Memory: WNL  Fund of knowledge:  Good  Insight:   Good  Judgment:  Good  Impulse Control: Good   Risk Assessment: Danger to Self:  No Self-injurious Behavior: No Danger to Others: No Duty to Warn:no Physical Aggression / Violence:No  Access to Firearms a concern: No  Gang Involvement:No   Subjective: Pt present for face-to-face individual therapy via video.  Pt consents to telehealth video session and is aware of limitations and benefits of virtual sessions. Location of pt: home Location of therapist: home office.   Pt talked about the holidays.  Her parents visited for about 2 weeks and pt really noticed her mother's cognitive decline.  Addressed how sad this was for pt.  She worries about her parents.   Their house is on the market and they will be moving to live near pt in the future.   Pt's inlaws also visited for a week.  Pt states that visit was stressful.  She feels like her inlaws are often telling her what to do and pt has a hard time not reacting to that.   Pt feels like her husband checks out when his parents visit.   She needs support from him.  Addressed how pt can communicate her needs to her husband.   Pt is feeling anxiety about her son seth starting his spring semester.  Pt will be at [facility] with him and it is stressful for her to not be home during the week.   She worries about  [employer] and how he will manage the work load given his disability.   Addressed pt's worries and worked on worry management.  Pt has been thinking about her issues with resentment toward her brother and wants to work on that in therapy in the future.   Worked on self care strategies. Provided supportive therapy.   Interventions: Cognitive Behavioral Therapy and Insight-Oriented  Diagnosis: F43.22   Plan of Care: Recommend ongoing therapy.  Pt participated in setting treatment goals.  Plan to continue to meet monthly.  Pt agrees with treatment plan.    Treatment Plan (Treatment Plan Target Date: 01/27/2025) Client Abilities/Strengths  Pt is bright, engaging and motivated for therapy.  Client Treatment Preferences  Individual therapy.  Client Statement of Needs  Improve coping skills.  Symptoms  Autonomic hyperactivity (e.g., palpitations, shortness of breath, dry mouth, trouble swallowing, nausea, diarrhea). Excessive and/or unrealistic worry that is difficult to control occurring more days than not for at least 6 months about a number of events or activities. Hypervigilance (e.g., feeling constantly on edge, experiencing concentration difficulties, having trouble falling or staying asleep, exhibiting a general state of irritability). Motor tension (e.g., restlessness, tiredness, shakiness, muscle tension). Problems Addressed  Anxiety Goals 1. Enhance ability to effectively cope with the full variety of life's worries and anxieties. 2. Learn and implement coping skills that result in a reduction of anxiety and worry, and improved daily functioning.  Objective Learn to accept limitations in life and commit to tolerating, rather than avoiding, unpleasant emotions while accomplishing meaningful goals. Target Date: 2025-01-27 Frequency: Monthly Progress: 60 Modality: individual Related Interventions 1. Use techniques from Acceptance and Commitment Therapy to help client accept uncomfortable  realities such as lack of complete control, imperfections, and uncertainty and tolerate unpleasant emotions and thoughts in order to accomplish value-consistent goals. Objective Learn and implement problem-solving strategies for realistically addressing worries. Target Date: 2025-01-27 Frequency: Monthly Progress: 60 Modality: individual Related Interventions 1. Assign the client a homework exercise in which he/she problem-solves a current problem.  review, reinforce success, and provide corrective feedback toward improvement. 2. Teach the client problem-solving strategies involving specifically defining a problem, generating options for addressing it, evaluating the pros and cons of each option, selecting and implementing an optional action, and reevaluating and refining the action. Objective Learn and implement calming skills to reduce overall anxiety and manage anxiety symptoms. Target Date: 2025-01-27 Frequency: Monthly Progress: 60 Modality: individual Related Interventions 1. Assign the client to read about progressive muscle relaxation and other calming strategies in relevant books or treatment manuals (e.g., Progressive Relaxation Training by Thornell and Elmer; Mastery of Your Anxiety and Worry: Workbook by Richarda armin Given). 2. Assign the client homework each session in which he/she practices relaxation exercises daily, gradually applying them progressively from non-anxiety-provoking to anxiety-provoking situations; review and reinforce success while providing corrective feedback toward improvement. 3. Teach the client calming/relaxation skills (e.g., applied relaxation, progressive muscle relaxation, cue controlled relaxation; mindful breathing; biofeedback) and how to discriminate better between relaxation and tension; teach the client how to apply these skills to his/her daily life. 3. Reduce overall frequency, intensity, and duration of the anxiety so that daily functioning is not  impaired. 4. Resolve the core conflict that is the source of anxiety. 5. Stabilize anxiety level while increasing ability to function on a daily basis. Diagnosis :    F43.22  Conditions For Discharge Achievement of treatment goals and objectives.  Abril Cappiello, LCSW   "

## 2024-10-31 ENCOUNTER — Ambulatory Visit: Admitting: Psychology

## 2024-11-28 ENCOUNTER — Ambulatory Visit: Admitting: Psychology
